# Patient Record
Sex: Female | Born: 1950
Health system: Southern US, Community
[De-identification: ages and names within clinical notes are randomized; demographics above are authoritative.]

## PROBLEM LIST (undated history)

## (undated) DIAGNOSIS — Z8619 Personal history of other infectious and parasitic diseases: Secondary | ICD-10-CM

## (undated) DIAGNOSIS — M1990 Unspecified osteoarthritis, unspecified site: Secondary | ICD-10-CM

## (undated) DIAGNOSIS — K219 Gastro-esophageal reflux disease without esophagitis: Secondary | ICD-10-CM

## (undated) DIAGNOSIS — F0390 Unspecified dementia without behavioral disturbance: Secondary | ICD-10-CM

## (undated) DIAGNOSIS — R011 Cardiac murmur, unspecified: Secondary | ICD-10-CM

## (undated) DIAGNOSIS — E785 Hyperlipidemia, unspecified: Secondary | ICD-10-CM

## (undated) DIAGNOSIS — T7840XA Allergy, unspecified, initial encounter: Secondary | ICD-10-CM

## (undated) HISTORY — PX: ABDOMINAL HYSTERECTOMY: SHX81

## (undated) HISTORY — DX: Cardiac murmur, unspecified: R01.1

## (undated) HISTORY — DX: Unspecified osteoarthritis, unspecified site: M19.90

## (undated) HISTORY — DX: Hyperlipidemia, unspecified: E78.5

## (undated) HISTORY — PX: POLYPECTOMY: SHX149

## (undated) HISTORY — DX: Allergy, unspecified, initial encounter: T78.40XA

## (undated) HISTORY — DX: Gastro-esophageal reflux disease without esophagitis: K21.9

## (undated) HISTORY — PX: COLONOSCOPY: SHX174

## (undated) HISTORY — DX: Personal history of other infectious and parasitic diseases: Z86.19

---

## 2003-12-16 ENCOUNTER — Encounter: Payer: Self-pay | Admitting: Internal Medicine

## 2004-07-26 ENCOUNTER — Ambulatory Visit: Payer: Self-pay | Admitting: Internal Medicine

## 2004-08-08 DIAGNOSIS — Z8619 Personal history of other infectious and parasitic diseases: Secondary | ICD-10-CM

## 2004-08-08 HISTORY — DX: Personal history of other infectious and parasitic diseases: Z86.19

## 2005-10-26 ENCOUNTER — Ambulatory Visit: Payer: Self-pay

## 2007-12-19 ENCOUNTER — Ambulatory Visit: Payer: Self-pay

## 2009-02-11 ENCOUNTER — Ambulatory Visit: Payer: Self-pay | Admitting: Internal Medicine

## 2009-02-11 DIAGNOSIS — J301 Allergic rhinitis due to pollen: Secondary | ICD-10-CM

## 2009-02-11 DIAGNOSIS — J4520 Mild intermittent asthma, uncomplicated: Secondary | ICD-10-CM

## 2009-02-11 DIAGNOSIS — K219 Gastro-esophageal reflux disease without esophagitis: Secondary | ICD-10-CM

## 2009-02-11 DIAGNOSIS — E785 Hyperlipidemia, unspecified: Secondary | ICD-10-CM | POA: Insufficient documentation

## 2009-02-12 LAB — CONVERTED CEMR LAB
Albumin: 4 g/dL (ref 3.5–5.2)
Alkaline Phosphatase: 58 units/L (ref 39–117)
Basophils Absolute: 0 10*3/uL (ref 0.0–0.1)
Bilirubin, Direct: 0 mg/dL (ref 0.0–0.3)
CO2: 31 meq/L (ref 19–32)
Calcium: 9.1 mg/dL (ref 8.4–10.5)
Chloride: 107 meq/L (ref 96–112)
Direct LDL: 158.1 mg/dL
Glucose, Bld: 92 mg/dL (ref 70–99)
HDL: 45.5 mg/dL (ref >39.00)
Hemoglobin: 14.4 g/dL (ref 12.0–15.0)
Lymphocytes Relative: 58.3 % — ABNORMAL HIGH (ref 12.0–46.0)
Monocytes Relative: 7.3 % (ref 3.0–12.0)
Neutro Abs: 1.4 10*3/uL (ref 1.4–7.7)
Phosphorus: 3.8 mg/dL (ref 2.3–4.6)
Platelets: 243 10*3/uL (ref 150.0–400.0)
Potassium: 3.8 meq/L (ref 3.5–5.1)
RDW: 12.6 % (ref 11.5–14.6)
TSH: 0.77 microintl units/mL (ref 0.35–5.50)
Total CHOL/HDL Ratio: 5
Total Protein: 6.8 g/dL (ref 6.0–8.3)
VLDL: 36 mg/dL (ref 0.0–40.0)

## 2009-02-27 ENCOUNTER — Ambulatory Visit: Payer: Self-pay | Admitting: Internal Medicine

## 2009-03-03 ENCOUNTER — Encounter: Payer: Self-pay | Admitting: Internal Medicine

## 2009-09-11 ENCOUNTER — Ambulatory Visit: Payer: Self-pay | Admitting: Family Medicine

## 2009-09-11 DIAGNOSIS — H60339 Swimmer's ear, unspecified ear: Secondary | ICD-10-CM

## 2009-09-15 ENCOUNTER — Telehealth: Payer: Self-pay | Admitting: Family Medicine

## 2010-02-14 ENCOUNTER — Encounter: Payer: Self-pay | Admitting: Internal Medicine

## 2010-02-16 ENCOUNTER — Telehealth (INDEPENDENT_AMBULATORY_CARE_PROVIDER_SITE_OTHER): Payer: Self-pay | Admitting: *Deleted

## 2010-02-17 ENCOUNTER — Ambulatory Visit: Payer: Self-pay | Admitting: Internal Medicine

## 2010-02-18 LAB — CONVERTED CEMR LAB
AST: 17 units/L (ref 0–37)
Alkaline Phosphatase: 58 units/L (ref 39–117)
Creatinine, Ser: 0.8 mg/dL (ref 0.4–1.2)
Direct LDL: 157.4 mg/dL
Eosinophils Relative: 2.6 % (ref 0.0–5.0)
GFR calc non Af Amer: 97.97 mL/min (ref >60)
Glucose, Bld: 95 mg/dL (ref 70–99)
Monocytes Absolute: 0.2 10*3/uL (ref 0.1–1.0)
Monocytes Relative: 6.3 % (ref 3.0–12.0)
Neutrophils Relative %: 29.4 % — ABNORMAL LOW (ref 43.0–77.0)
Phosphorus: 3.8 mg/dL (ref 2.3–4.6)
Platelets: 260 10*3/uL (ref 150.0–400.0)
Sodium: 142 meq/L (ref 135–145)
TSH: 0.96 microintl units/mL (ref 0.35–5.50)
Total Bilirubin: 0.7 mg/dL (ref 0.3–1.2)
Total CHOL/HDL Ratio: 5
Triglycerides: 145 mg/dL (ref 0.0–149.0)
VLDL: 29 mg/dL (ref 0.0–40.0)
WBC: 3.9 10*3/uL — ABNORMAL LOW (ref 4.5–10.5)

## 2010-02-22 ENCOUNTER — Ambulatory Visit: Payer: Self-pay | Admitting: Internal Medicine

## 2010-03-01 ENCOUNTER — Ambulatory Visit: Payer: Self-pay | Admitting: Internal Medicine

## 2010-03-02 ENCOUNTER — Encounter: Payer: Self-pay | Admitting: Internal Medicine

## 2010-03-22 ENCOUNTER — Encounter: Payer: Self-pay | Admitting: Internal Medicine

## 2010-03-22 ENCOUNTER — Ambulatory Visit: Payer: Self-pay | Admitting: Internal Medicine

## 2010-03-24 ENCOUNTER — Encounter: Payer: Self-pay | Admitting: Internal Medicine

## 2010-09-05 LAB — CONVERTED CEMR LAB: Fecal Occult Bld: NEGATIVE

## 2010-09-08 NOTE — Letter (Signed)
Summary: [facility]: Immunoassay Fecal Occult Blood (iFOB) Order Form  [place] [facility]  [address] [place], [place] [address]   Phone: [phone]  Fax: [phone]      [facility]: Immunoassay Fecal Occult Blood (iFOB) Order Form   February 22, 2010 MRN: [id number]   FAWN DESROCHER [birth year]   Physicican Name:_______[place]__________________  Diagnosis Code:_______V76.51___________________      Cindee Salt MD

## 2010-09-08 NOTE — Progress Notes (Signed)
Summary: ear drops are too costly  Phone Note Call from Patient Call back at [phone]   Caller: Patient Summary of Call: Pt states that the ear drops that she was prescribed at her last visit are too expensive, over a hundred dollars.  She is asking that something less expensive be called to [facility] [address]. Initial call taken by: Lowella Petties CMA,  September 15, 2009 12:18 PM    New/Updated Medications: CIPRODEX 0.3-0.1 % SUSP (CIPROFLOXACIN-DEXAMETHASONE) 4 drops into affected ear two times a day x 7 days Prescriptions: CIPRODEX 0.3-0.1 % SUSP (CIPROFLOXACIN-DEXAMETHASONE) 4 drops into affected ear two times a day x 7 days  #1 x 0   Entered and Authorized by:   Ruthe Mannan MD   Signed by:   Ruthe Mannan MD on 09/15/2009   Method used:   Electronically to        [facility] [address] (retail)       [address], [address]       [place], [place]  [address]       Ph: [phone]       Fax: [phone]   RxID:   [id number]   Appended Document: ear drops are too costly Patient Advised.

## 2010-09-08 NOTE — Letter (Signed)
Summary: Results Follow up Letter  [place] at [facility]  [address] [place], [place] [address]   Phone: [phone]  Fax: [phone]    03/02/2010 MRN: [id number]  Natalie [name] [address] [place], [place]  [address]  Dear Ms. Harker,  The following are the results of your recent test(s):  Test         Result    Pap Smear:        Normal _____  Not Normal _____ Comments: ______________________________________________________ Cholesterol: LDL(Bad cholesterol):         Your goal is less than:         HDL (Good cholesterol):       Your goal is more than: Comments:  ______________________________________________________ Mammogram:        Normal _____  Not Normal _____ Comments:  ___________________________________________________________________ Hemoccult:        Normal __X___  Not normal _______ Comments:  Please repeat in one year.  _____________________________________________________________________ Other Tests:    We routinely do not discuss normal results over the telephone.  If you desire a copy of the results, or you have any questions about this information we can discuss them at your next office visit.   Sincerely,      Tillman Abide, MD

## 2010-09-08 NOTE — Letter (Signed)
Summary: Results Follow up Letter  [place] at [facility]  [address] [place], [place] [address]   Phone: [phone]  Fax: [phone]    03/24/2010 MRN: [id number]  Natalie [name] [address] [place], [place]  [address]  Dear [name],  The following are the results of your recent test(s):  Test         Result    Pap Smear:        Normal _____  Not Normal _____ Comments: ______________________________________________________ Cholesterol: LDL(Bad cholesterol):         Your goal is less than:         HDL (Good cholesterol):       Your goal is more than: Comments:  ______________________________________________________ Mammogram:        Normal __X___  Not Normal _____ Comments: mammo looks fine,repeat recommended in 1-2 years  ___________________________________________________________________ Hemoccult:        Normal _____  Not normal _______ Comments:    _____________________________________________________________________ Other Tests:    We routinely do not discuss normal results over the telephone.  If you desire a copy of the results, or you have any questions about this information we can discuss them at your next office visit.   Sincerely,      Tillman Abide, MD

## 2010-09-08 NOTE — Letter (Signed)
Summary: [facility]   Imported By: Lanelle Bal 02/26/2010 10:17:04  _____________________________________________________________________  External Attachment:    Type:   Image     Comment:   External Document

## 2010-09-08 NOTE — Assessment & Plan Note (Signed)
Summary: CPX/CLE   Vital Signs:  Patient profile:   60 year old female Height:      63 inches Weight:      190 pounds Temp:     98.4 degrees F oral Pulse rate:   68 / minute Pulse rhythm:   regular BP sitting:   118 / 82  (right arm) Cuff size:   large  Vitals Entered By: Lowella Petties CMA (February 22, 2010 11:52 AM) CC: Check up   History of Present Illness: Had problem with left hand Couldn't bend thumb diagnosed with ligament problem started  ~10 days ago no known injury but had been doing different work at her job does feel better now has been wearing immobilizer  Asthma has been fairly quiet Not consistent with inhalers does have problems if she mows--she has her brother do this for her No regular cough uses albuterol very rarely  Allergies: No Known Drug Allergies  Past History:  Past medical, surgical, family and social histories (including risk factors) reviewed for relevance to current acute and chronic problems.  Past Medical History: Reviewed history from 02/11/2009 and no changes required. Allergic rhinitis Asthma Hyperlipidemia GERD  Past Surgical History: Reviewed history from 02/11/2009 and no changes required. Hysterectomy -1988   Fibroids VD x 2 Termination of pregnancy once  Family History: Reviewed history from 02/11/2009 and no changes required. Mom with arthritis, ulcers, HTN, DM Dad died of colon cancer 5 siblings 1 brother died of pancreatic cancer No CAD  Social History: Reviewed history from 02/11/2009 and no changes required. Occupation: Doctor, general practice [employer] Single---lives with mom 2 grown children Never Smoked Alcohol use-no  Review of Systems General:  sleeps okay in general weight up a few pounds no regular exercise--walks some at work wears seat belt. Eyes:  Denies double vision and vision loss-1 eye; using bifocals now. ENT:  Denies decreased hearing and ringing in ears; teeth  okay---working with dentist now. CV:  Complains of shortness of breath with exertion; denies chest pain or discomfort, difficulty breathing at night, difficulty breathing while lying down, fainting, near fainting, and palpitations; stable DOE going up steps. Resp:  Denies cough and shortness of breath. GI:  Denies abdominal pain, bloody stools, change in bowel habits, dark tarry stools, indigestion, nausea, and vomiting. GU:  Denies dysuria and incontinence; no sexual problems. MS:  Denies joint pain and joint swelling. Derm:  Denies lesion(s) and rash; small scattered hypopigmented areas. Neuro:  Denies headaches, numbness, tingling, and weakness. Psych:  Denies anxiety and depression. Heme:  Denies abnormal bruising and enlarge lymph nodes. Allergy:  Complains of seasonal allergies and sneezing; does well with the cetirizine.  Physical Exam  General:  alert and normal appearance.   Eyes:  pupils equal, pupils round, pupils reactive to light, and no optic disk abnormalities.   Ears:  R ear normal and L ear normal.   Mouth:  no erythema, no exudates, and no lesions.   Neck:  supple, no masses, no thyromegaly, no carotid bruits, and no cervical lymphadenopathy.   Breasts:  no masses, no abnormal thickening, no tenderness, and no adenopathy.   Lungs:  normal respiratory effort, normal breath sounds, no crackles, and no wheezes.   Heart:  normal rate, regular rhythm, no murmur, and no gallop.   Abdomen:  soft, non-tender, and no masses.   Msk:  no joint tenderness and no joint swelling.   Left hand and thumb not tender and with normal strength now Pulses:  1+  in feet Extremities:  no edema Neurologic:  alert & oriented X3, strength normal in all extremities, and gait normal.   Skin:  no rashes and no suspicious lesions.   Psych:  normally interactive, good eye contact, not anxious appearing, and not depressed appearing.     Impression & Recommendations:  Problem # 1:  PREVENTIVE HEALTH  CARE (ICD-V70.0) Assessment Comment Only healthy discussed being more active hand is better--probably just temporary strain at work  will do stool immunoassay order mammo  Problem # 2:  ASTHMA (ICD-493.90) Assessment: Unchanged  mild and intermittent spirometry is normal  Her updated medication list for this problem includes:    Qvar 80 Mcg/act Aers (Beclomethasone dipropionate) .Marland Kitchen... Take 1 puff daily use spacer device and rinse mouth after    Proventil Hfa 108 (90 Base) Mcg/act Aers (Albuterol sulfate) .Marland Kitchen... Take 2 puffs three times a day as needed for asthma symptoms  Orders: Spirometry w/Graph (94010)  Problem # 3:  HYPERLIPIDEMIA (ICD-272.4) No meds  Labs Reviewed: SGOT: 17 (02/17/2010)   SGPT: 10 (02/17/2010)   HDL:45.70 (02/17/2010), 45.50 (02/11/2009)  Chol:231 (02/17/2010), 232 (02/11/2009)  Trig:145.0 (02/17/2010), 180.0 (02/11/2009)  Problem # 4:  GERD (ICD-530.81) Assessment: Unchanged okay without meds  Complete Medication List: 1)  Cetirizine Hcl 10 Mg Tabs (Cetirizine hcl) .... Take 1 by mouth once daily 2)  Qvar 80 Mcg/act Aers (Beclomethasone dipropionate) .... Take 1 puff daily use spacer device and rinse mouth after 3)  Proventil Hfa 108 (90 Base) Mcg/act Aers (Albuterol sulfate) .... Take 2 puffs three times a day as needed for asthma symptoms  Other Orders: Radiology Referral (Radiology)  Patient Instructions: 1)  Please schedule a follow-up appointment in 1 year.  2)  Schedule your mammogram.  3)  Complete your hemoccult cards and return them soon.  Prescriptions: PROVENTIL HFA 108 (90 BASE) MCG/ACT AERS (ALBUTEROL SULFATE) take 2 puffs three times a day as needed for asthma symptoms  #1 x 1   Entered and Authorized by:   Cindee Salt MD   Signed by:   Cindee Salt MD on 02/22/2010   Method used:   Print then Give to Patient   RxID:   [id number] QVAR 80 MCG/ACT AERS (BECLOMETHASONE DIPROPIONATE) take 1 puff daily Use spacer  device and rinse mouth after  #1 x 5   Entered and Authorized by:   Cindee Salt MD   Signed by:   Cindee Salt MD on 02/22/2010   Method used:   Print then Give to Patient   RxID:   [id number]   Prior Medications (reviewed today): CETIRIZINE HCL 10 MG TABS (CETIRIZINE HCL) take 1 by mouth once daily QVAR 80 MCG/ACT AERS (BECLOMETHASONE DIPROPIONATE) take 1 puff daily Use spacer device and rinse mouth after PROVENTIL HFA 108 (90 BASE) MCG/ACT AERS (ALBUTEROL SULFATE) take 2 puffs three times a day as needed for asthma symptoms Current Allergies: No known allergies

## 2010-09-08 NOTE — Assessment & Plan Note (Signed)
Summary: EAR/CLE   Vital Signs:  Patient profile:   60 year old female Height:      63 inches Weight:      187 pounds BMI:     33.25 Temp:     98.3 degrees F oral Pulse rate:   80 / minute Pulse rhythm:   regular BP sitting:   138 / 86  (left arm) Cuff size:   regular  Vitals Entered By: Delilah Shan CMA Duncan Dull) (September 11, 2009 3:46 PM) CC: Ear   History of Present Illness: Natalie yo with 2 weeks of left ear pain. Pain is intermittent. Has not had URI symptoms but does have allregies.  On inhaled steroid for asthma. No fevers, chills. NO decreased hearing. No ear discharge. Never had anything like this before.  Current Medications (verified): 1)  Cetirizine Hcl 10 Mg Tabs (Cetirizine Hcl) .... Take 1 By Mouth Once Daily 2)  Qvar 80 Mcg/act Aers (Beclomethasone Dipropionate) .... Take 1 Puff Daily Use Spacer Device and Rinse Mouth After 3)  Proventil Hfa 108 (90 Base) Mcg/act Aers (Albuterol Sulfate) .... Take 2 Puffs Three Times A Day As Needed For Asthma Symptoms 4)  Cetraxal 0.2 % Soln (Ciprofloxacin Hcl) .Marland Kitchen.. 1 Drop in Affected Ear Two Times A Day X 7 Days  Allergies (verified): No Known Drug Allergies  Review of Systems      See HPI General:  Denies chills and fever. ENT:  Complains of earache; denies decreased hearing, ear discharge, postnasal drainage, sinus pressure, and sore throat. Resp:  Denies cough.  Physical Exam  General:  alert and normal appearance.   Ears:  Right and left TMs normal. Mild inflammation of right canal, other wise normal.  Pinna and tragus non tender. Nose:  External nasal examination shows no deformity or inflammation. Nasal mucosa are pink and moist without lesions or exudates. Mouth:  no erythema, no lesions, and poor dentition.   Lungs:  normal respiratory effort and normal breath sounds.   Heart:  normal rate, regular rhythm, no murmur, and no gallop.   Extremities:  no edema Psych:  normally interactive, good eye contact, not  anxious appearing, and not depressed appearing.     Impression & Recommendations:  Problem # 1:  OTITIS EXTERNA, ACUTE (ICD-380.12) Assessment New Will try Cipro drops (not ciprodex as too expensive). Inflammation very mild on exam, advised to follow up next week if no improvement or if symptoms worsen. Her updated medication list for this problem includes:    Cetraxal 0.2 % Soln (Ciprofloxacin hcl) .Marland Kitchen... 1 drop in affected ear two times a day x 7 days  Complete Medication List: 1)  Cetirizine Hcl 10 Mg Tabs (Cetirizine hcl) .... Take 1 by mouth once daily 2)  Qvar 80 Mcg/act Aers (Beclomethasone dipropionate) .... Take 1 puff daily use spacer device and rinse mouth after 3)  Proventil Hfa 108 (90 Base) Mcg/act Aers (Albuterol sulfate) .... Take 2 puffs three times a day as needed for asthma symptoms 4)  Cetraxal 0.2 % Soln (Ciprofloxacin hcl) .Marland Kitchen.. 1 drop in affected ear two times a day x 7 days Prescriptions: CETRAXAL 0.2 % SOLN (CIPROFLOXACIN HCL) 1 drop in affected ear two times a day x 7 days  #1 x 0   Entered and Authorized by:   Ruthe Mannan MD   Signed by:   Ruthe Mannan MD on 09/11/2009   Method used:   Electronically to        [facility] [address] (retail)  [address]       [place], [place]  [address]       Ph: [phone]       Fax: [phone]   RxID:   [id number]   Current Allergies (reviewed today): No known allergies

## 2010-09-08 NOTE — Progress Notes (Signed)
----   Converted from flag ---- ---- 02/16/2010 10:35 AM, Cindee Salt MD wrote: renal, CBC with diff, TSH--530.81 lipid, hepatic--272.4  ---- 02/16/2010 7:54 AM, Liane Comber CMA (AAMA) wrote: Pt is scheduled for cpx labs tomorrow, what labs to draw and dx codes? Thanks Tasha ------------------------------

## 2011-02-21 ENCOUNTER — Encounter: Payer: Self-pay | Admitting: Internal Medicine

## 2011-02-21 ENCOUNTER — Ambulatory Visit (INDEPENDENT_AMBULATORY_CARE_PROVIDER_SITE_OTHER): Payer: BC Managed Care – PPO | Admitting: Internal Medicine

## 2011-02-21 VITALS — BP 120/70 | HR 81 | Temp 98.6°F | Ht 63.0 in | Wt 188.0 lb

## 2011-02-21 DIAGNOSIS — S86819A Strain of other muscle(s) and tendon(s) at lower leg level, unspecified leg, initial encounter: Secondary | ICD-10-CM

## 2011-02-21 DIAGNOSIS — S86919A Strain of unspecified muscle(s) and tendon(s) at lower leg level, unspecified leg, initial encounter: Secondary | ICD-10-CM | POA: Insufficient documentation

## 2011-02-21 MED ORDER — BECLOMETHASONE DIPROPIONATE 80 MCG/ACT IN AERS: 1.0000 | INHALATION_SPRAY | RESPIRATORY_TRACT | Status: DC | PRN

## 2011-02-21 MED ORDER — TRAMADOL HCL 50 MG PO TABS: 50.0000 mg | ORAL_TABLET | Freq: Four times a day (QID) | ORAL | Status: AC | PRN

## 2011-02-21 MED ORDER — ALBUTEROL SULFATE HFA 108 (90 BASE) MCG/ACT IN AERS: 2.0000 | INHALATION_SPRAY | Freq: Four times a day (QID) | RESPIRATORY_TRACT | Status: DC | PRN

## 2011-02-21 NOTE — Progress Notes (Signed)
  Subjective:    Patient ID: Natalie [name], female    DOB: 06-23-1951, 60 y.o.   MRN: [id number]  HPI Having trouble with her right knee Pain with moving in a certain way Started over a week ago If reaches over laterally it hurts Hasn't noticed a problem with steps-- "I take it slow" Has to walk slow on flat ground  Did have some swelling last week--better now  Has been using advil 400mg --occ at work or at night May help the pain but not always  Has trip to [place] planned for Friday To see son  No current outpatient prescriptions on file prior to visit.    No Known Allergies  Past Medical History  Diagnosis Date  . Allergy   . Asthma   . Hyperlipidemia   . GERD (gastroesophageal reflux disease)     Past Surgical History  Procedure Date  . Abdominal hysterectomy   . Vaginal delivery     x2    Family History  Problem Relation Age of Onset  . Arthritis Mother   . Diabetes Mother   . Hypertension Mother   . Heart disease Neg Hx     History   Social History  . Marital Status: Single    Spouse Name: N/A    Number of Children: 2  . 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 Smoking status: Never Smoker   . Smokeless tobacco: Never Used  . Alcohol Use: No  . Drug Use: No  . Sexually Active: Not on file   Other Topics Concern  . Not on file   Social History Narrative  . No narrative on file   Review of Systems Some trouble trying to sleep--gets pain Recent laryngitis--better now after antibiotic from urgent care at [facility] No fever now     Objective:   Physical Exam  Constitutional: She appears well-developed and well-nourished.  Musculoskeletal:       Gait normal No knee swelling ??slightly positive [place] with medial stress on right Ligaments all stable Normal passive ROM          Assessment & Plan:

## 2011-02-21 NOTE — Assessment & Plan Note (Signed)
Mild symptoms Could have slight meniscus tear but certainly not something clear cut Advised to try brace to prevent twisting advil and tramadol for prn use Dr Patsy Lager if worsens

## 2011-02-21 NOTE — Patient Instructions (Signed)
Please continue the advil, 2 tabs, up to 3 times a day for pain Please try the tramadol if the pain continues Please use a brace to prevent your knee from twisting.  If your knee pain worsens, call for appt with Dr Patsy Lager, our sports medicine specialist

## 2011-03-18 ENCOUNTER — Ambulatory Visit (INDEPENDENT_AMBULATORY_CARE_PROVIDER_SITE_OTHER): Payer: BC Managed Care – PPO | Admitting: Family Medicine

## 2011-03-18 ENCOUNTER — Encounter: Payer: Self-pay | Admitting: Family Medicine

## 2011-03-18 DIAGNOSIS — M25569 Pain in unspecified knee: Secondary | ICD-10-CM

## 2011-03-18 DIAGNOSIS — M2390 Unspecified internal derangement of unspecified knee: Secondary | ICD-10-CM

## 2011-03-18 DIAGNOSIS — M25561 Pain in right knee: Secondary | ICD-10-CM

## 2011-03-18 DIAGNOSIS — M2391 Unspecified internal derangement of right knee: Secondary | ICD-10-CM | POA: Insufficient documentation

## 2011-03-18 MED ORDER — TRAMADOL HCL 50 MG PO TABS: 50.0000 mg | ORAL_TABLET | Freq: Four times a day (QID) | ORAL | Status: DC | PRN

## 2011-03-18 NOTE — Progress Notes (Addendum)
  Subjective:    Patient ID: Natalie [name], female    DOB: 1951/04/09, 60 y.o.   MRN: [id number]  HPI  Natalie [name], a 60 y.o. female presents today in the office for the following:    R knee injury, initial strain about a month ago, then about 2 weeks ago, fell while in [place], and now knee is swollen with a large effusion.  Patient presents with 14 day h/o exacerbation on top of 1 month of anteromedial sided knee pain after falling while in [place]. No audible pop was heard. The patient has had an effusion. No symptomatic giving-way. No mechanical clicking. Joint has not locked up. Patient has been able to walk but is limping. The patient does not pain going up and down stairs or rising from a seated position.   Pain location: medial mostly Current physical activity: none Prior Knee Surgery: none Current pain meds: motrin, tramadol Bracing: wearing a supportive brace  Prior eval at [facility]. X-rays at there facility showed some OA changes per her report. Not available for review.  The PMH, PSH, Social History, Family History, Medications, and allergies have been reviewed in [facility], and have been updated if relevant.  Review of Systems  REVIEW OF SYSTEMS  GEN: No fevers, chills. Nontoxic. Primarily MSK c/o today. MSK: Detailed in the HPI GI: tolerating PO intake without difficulty Neuro: No numbness, parasthesias, or tingling associated. Otherwise the pertinent positives of the ROS are noted above.      Objective:   Physical Exam   Physical Exam  Blood pressure 120/70, pulse 76, temperature 98.5 F (36.9 C), temperature source Oral, height 5\' 4"  (1.626 m), weight 190 lb (86.183 kg), SpO2 98.00%.  GEN: Well-developed,well-nourished,in no acute distress; alert,appropriate and cooperative throughout examination HEENT: Normocephalic and atraumatic without obvious abnormalities. Ears, externally no deformities PULM: Breathing comfortably in no respiratory distress EXT: No  clubbing, cyanosis, or edema PSYCH: Normally interactive. Cooperative during the interview. Pleasant. Friendly and conversant. Not anxious or depressed appearing. Normal, full affect.  Gait: Normal heel toe pattern - ANTALGIC ROM: 0 - 85 Effusion: large, ballotable Echymosis or edema: none Patellar tendon NT Painful PLICA: neg Patellar grind: negative Medial and lateral patellar facet loading: negative medial and lateral joint lines: mild TTP, medial Meniscal testing equivocal given effusion Varus and valgus stress: stable Lachman: neg Ant and Post drawer: neg Hip abduction, IR, ER: WNL Hip flexion str: 5/5 Hip abd: 5/5 Quad: 5/5 VMO atrophy:mild       Assessment & Plan:   1. Right knee pain  traMADol (ULTRAM) 50 MG tablet  2. Internal derangement of right knee  traMADol (ULTRAM) 50 MG tablet   OA flare vs. Tear, difficult to say with that large an effusion. Will aspirate, inject and recheck in 3 weeks.  Knee Aspiration and Injection, R knee Patient verbally consented; risks, benefits, and alternatives explained. Patient prepped with betadine. Ethyl chloride for anesthesia. 10 cc of 1% Lidocaine used in wheal then injected Subcutaneous fashion with 27 gauge needle on lateral approach. Under sterilne conditions, 18 gauge needle used via lateral approach to aspirate 30 cc of yellowish synovial fluid. Then 9 cc of Lidocaine 1% and 1 cc of Kenalog 40 mg injected. Tolerated well, decreased pain, no complications.

## 2011-03-18 NOTE — Patient Instructions (Signed)
Recheck with Dr. Patsy Lager in 3 weeks

## 2011-03-19 NOTE — Progress Notes (Signed)
  Subjective:    Patient ID: Natalie [name], female    DOB: May 20, 1951, 60 y.o.   MRN: [id number]  HPI    Review of Systems     Objective:   Physical Exam        Assessment & Plan:

## 2011-03-23 ENCOUNTER — Encounter: Payer: Self-pay | Admitting: Internal Medicine

## 2011-03-23 ENCOUNTER — Ambulatory Visit (INDEPENDENT_AMBULATORY_CARE_PROVIDER_SITE_OTHER): Payer: BC Managed Care – PPO | Admitting: Internal Medicine

## 2011-03-23 DIAGNOSIS — Z0000 Encounter for general adult medical examination without abnormal findings: Secondary | ICD-10-CM

## 2011-03-23 DIAGNOSIS — J45909 Unspecified asthma, uncomplicated: Secondary | ICD-10-CM

## 2011-03-23 DIAGNOSIS — E785 Hyperlipidemia, unspecified: Secondary | ICD-10-CM

## 2011-03-23 DIAGNOSIS — Z1231 Encounter for screening mammogram for malignant neoplasm of breast: Secondary | ICD-10-CM

## 2011-03-23 DIAGNOSIS — K219 Gastro-esophageal reflux disease without esophagitis: Secondary | ICD-10-CM

## 2011-03-23 LAB — HEPATIC FUNCTION PANEL
ALT: 19 U/L (ref 0–35)
AST: 19 U/L (ref 0–37)
Albumin: 4 g/dL (ref 3.5–5.2)
Total Bilirubin: 0.7 mg/dL (ref 0.3–1.2)

## 2011-03-23 LAB — CBC WITH DIFFERENTIAL/PLATELET
Basophils Relative: 0.7 % (ref 0.0–3.0)
Eosinophils Relative: 1.1 % (ref 0.0–5.0)
HCT: 42.9 % (ref 36.0–46.0)
Hemoglobin: 14.2 g/dL (ref 12.0–15.0)
Lymphs Abs: 3.2 10*3/uL (ref 0.7–4.0)
Monocytes Relative: 5.4 % (ref 3.0–12.0)
Neutro Abs: 2.9 10*3/uL (ref 1.4–7.7)
WBC: 6.6 10*3/uL (ref 4.5–10.5)

## 2011-03-23 LAB — BASIC METABOLIC PANEL
BUN: 12 mg/dL (ref 6–23)
CO2: 33 mEq/L — ABNORMAL HIGH (ref 19–32)
Calcium: 9.2 mg/dL (ref 8.4–10.5)
Creatinine, Ser: 0.9 mg/dL (ref 0.4–1.2)
GFR: 85.93 mL/min (ref >60.00)
Glucose, Bld: 91 mg/dL (ref 70–99)
Sodium: 142 mEq/L (ref 135–145)

## 2011-03-23 NOTE — Assessment & Plan Note (Signed)
She prefers no meds Discussed lifestyle measures

## 2011-03-23 NOTE — Progress Notes (Signed)
Subjective:    Patient ID: Natalie [name], female    DOB: 07-Aug-1951, 60 y.o.   MRN: [id number]  HPI Right knee is still achy medially Gets stiff when sitting---does loosen up after a while with walking Gets occ shooting pain  Asthma has been controlled Rarely uses rescue inhaler Doesn't really do much activity  No stomach troubles  Discussed cholesterol She is not excited about meds  Current Outpatient Prescriptions on File Prior to Visit  Medication Sig Dispense Refill  . albuterol (VENTOLIN HFA) 108 (90 BASE) MCG/ACT inhaler Inhale 2 puffs into the lungs every 6 (six) hours as needed for wheezing.  1 Inhaler  1  . beclomethasone (QVAR) 80 MCG/ACT inhaler Inhale 1 puff into the lungs as needed.  1 Inhaler  12  . cetirizine (ZYRTEC) 10 MG tablet Take 10 mg by mouth daily.        . traMADol (ULTRAM) 50 MG tablet Take 1 tablet (50 mg total) by mouth every 6 (six) hours as needed for pain.  50 tablet  1    No Known Allergies  Past Medical History  Diagnosis Date  . Allergy   . Asthma   . Hyperlipidemia   . GERD (gastroesophageal reflux disease)     Past Surgical History  Procedure Date  . Abdominal hysterectomy   . Vaginal delivery     x2    Family History  Problem Relation Age of Onset  . Arthritis Mother   . Diabetes Mother   . Hypertension Mother   . Heart disease Neg Hx     History   Social History  . Marital Status: Single    Spouse Name: N/A    Number of Children: 2  . 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 Smoking status: Never Smoker   . Smokeless tobacco: Never Used  . Alcohol Use: No  . Drug Use: No  . Sexually Active: Not on file   Other Topics Concern  . Not on file   Social History Narrative  . No narrative on file   Review of Systems  Constitutional: Negative for fatigue and unexpected weight change.       Wears seat belt  HENT:  Positive for congestion and rhinorrhea. Negative for hearing loss, dental problem and tinnitus.        Trouble with allergies to dust---does okay with cetirizine Has been getting dental fillings caught up  Eyes: Negative for visual disturbance.       No diplopia or focal vision loss New glasses Early cataracts  Respiratory: Negative for cough, chest tightness and shortness of breath.   Cardiovascular: Positive for leg swelling. Negative for chest pain and palpitations.       Right foot swelling with the knee problems--related to brace she wore  Gastrointestinal: Positive for constipation. Negative for nausea, vomiting, abdominal pain and blood in stool.       Mild constipation with travel to [facility] No sig heartburn  Genitourinary: Positive for frequency. Negative for dysuria and difficulty urinating.  Musculoskeletal: Positive for joint swelling and arthralgias. Negative for back pain.       Only right knee problems Had right knee x-ray at urgent care and showed early arthritis  Skin: Negative for rash.       No suspicious lesions  Neurological: Negative for dizziness, syncope, weakness, light-headedness, numbness and headaches.  Hematological: Negative for adenopathy.  Does not bruise/bleed easily.  Psychiatric/Behavioral: Negative for sleep disturbance and dysphoric mood. The patient is not nervous/anxious.        Objective:   Physical Exam  Constitutional: She is oriented to person, place, and time. She appears well-developed and well-nourished. No distress.  HENT:  Head: Normocephalic and atraumatic.  Right Ear: External ear normal.  Left Ear: External ear normal.  Mouth/Throat: Oropharynx is clear and moist.       TMs normal  Eyes: Conjunctivae and EOM are normal. Pupils are equal, round, and reactive to light.       Fundi benign  Neck: Normal range of motion. Neck supple. No thyromegaly present.  Cardiovascular: Normal rate, regular rhythm, normal heart sounds and  intact distal pulses.  Exam reveals no gallop.   No murmur heard. Pulmonary/Chest: Effort normal and breath sounds normal. No respiratory distress. She has no wheezes. She has no rales.  Abdominal: Soft. She exhibits no mass. There is no tenderness.  Musculoskeletal: Normal range of motion. She exhibits no edema and no tenderness.       No sig effusion in right knee now  Lymphadenopathy:    She has no cervical adenopathy.  Neurological: She is alert and oriented to person, place, and time. She exhibits normal muscle tone.       No focal weakness  Skin: Skin is warm. No rash noted.  Psychiatric: She has a normal mood and affect. Her behavior is normal. Judgment and thought content normal.          Assessment & Plan:

## 2011-03-23 NOTE — Assessment & Plan Note (Signed)
Quiet on current Rx No changes needed

## 2011-03-23 NOTE — Assessment & Plan Note (Signed)
Quiet without meds now

## 2011-03-23 NOTE — Patient Instructions (Signed)
Please set up your mammogram Please do the stool test and mail back Check on the shingles vaccine with your insurance (zostavax) and set up to get the vaccine if it is covered

## 2011-03-23 NOTE — Assessment & Plan Note (Signed)
Healthy but out of shape Will do stool tests again Requests yearly mammo Discussed zostavax---she will look into it

## 2011-03-31 ENCOUNTER — Other Ambulatory Visit: Payer: BC Managed Care – PPO

## 2011-03-31 ENCOUNTER — Other Ambulatory Visit: Payer: Self-pay | Admitting: Internal Medicine

## 2011-03-31 DIAGNOSIS — Z1211 Encounter for screening for malignant neoplasm of colon: Secondary | ICD-10-CM

## 2011-03-31 LAB — FECAL OCCULT BLOOD, IMMUNOCHEMICAL: Fecal Occult Bld: NEGATIVE

## 2011-04-01 ENCOUNTER — Encounter: Payer: Self-pay | Admitting: *Deleted

## 2011-04-06 ENCOUNTER — Encounter: Payer: Self-pay | Admitting: Family Medicine

## 2011-04-06 ENCOUNTER — Ambulatory Visit (INDEPENDENT_AMBULATORY_CARE_PROVIDER_SITE_OTHER): Payer: BC Managed Care – PPO | Admitting: Family Medicine

## 2011-04-06 DIAGNOSIS — M2391 Unspecified internal derangement of right knee: Secondary | ICD-10-CM

## 2011-04-06 DIAGNOSIS — M2390 Unspecified internal derangement of unspecified knee: Secondary | ICD-10-CM

## 2011-04-06 NOTE — Patient Instructions (Signed)
F/u 6 weeks 

## 2011-04-07 NOTE — Progress Notes (Signed)
  Subjective:    Patient ID: Natalie [name], female    DOB: 1950-11-06, 60 y.o.   MRN: [id number]  HPI  Natalie [name], a 60 y.o. female presents today in the office for the following:  F/u R knee injury, injury in early July while in [place], seen several weeks ago with an effusion and some limitation in motion. Has been altering activities, aspirated and injected her knee last OV. Now pain is a good deal better, not resolved, but without mechanical symptoms.  03/18/2011 R knee injury, initial strain about a month ago, then about 2 weeks ago, fell while in [place], and now knee is swollen with a large effusion.  Patient presents with 14 day h/o exacerbation on top of 1 month of anteromedial sided knee pain after falling while in [place]. No audible pop was heard. The patient has had an effusion. No symptomatic giving-way. No mechanical clicking. Joint has not locked up. Patient has been able to walk but is limping. The patient does not pain going up and down stairs or rising from a seated position.   Pain location: medial mostly Current physical activity: none Prior Knee Surgery: none Current pain meds: motrin, tramadol Bracing: wearing a supportive brace  Prior eval at [facility]. X-rays at there facility showed some OA changes per her report. Not available for review.   Review of Systems REVIEW OF SYSTEMS  GEN: No fevers, chills. Nontoxic. Primarily MSK c/o today. MSK: Detailed in the HPI GI: tolerating PO intake without difficulty Neuro: No numbness, parasthesias, or tingling associated. Otherwise the pertinent positives of the ROS are noted above.      Objective:   Physical Exam   Physical Exam  Blood pressure 120/70, pulse 89, temperature 97.9 F (36.6 C), temperature source Oral, height 5\' 4"  (1.626 m), weight 184 lb 6.4 oz (83.643 kg), SpO2 97.00%.  GEN: Well-developed,well-nourished,in no acute distress; alert,appropriate and cooperative throughout examination HEENT:  Normocephalic and atraumatic without obvious abnormalities. Ears, externally no deformities PULM: Breathing comfortably in no respiratory distress EXT: No clubbing, cyanosis, or edema PSYCH: Normally interactive. Cooperative during the interview. Pleasant. Friendly and conversant. Not anxious or depressed appearing. Normal, full affect.  Gait: Normal heel toe pattern, mild limp ROM: WNL, 0-125 Effusion: neg Echymosis or edema: none Patellar tendon NT Painful PLICA: neg Patellar grind: negative Medial and lateral patellar facet loading: minimal pain medial and lateral joint lines: mild tenderness medially Mcmurray's neg Flexion-pinch neg Varus and valgus stress: stable Lachman: neg Ant and Post drawer: neg Hip abduction, IR, ER: WNL Hip flexion str: 4+/5 Hip abd: 4+/5 Quad: 4+/5 VMO atrophy: notable on the R compared to L      Assessment & Plan:   1. Internal derangement of right knee    Knee exam looks much better today Pain improved  Significant weakness on the right Reviewed HEP from [facility] and would try to address str deficits Suspect much of remaining pain from OA exac resolving and resulting atrophy  Will follow conservatively and recheck in 6 weeks

## 2011-04-22 ENCOUNTER — Ambulatory Visit: Payer: Self-pay | Admitting: Internal Medicine

## 2011-04-26 ENCOUNTER — Encounter: Payer: Self-pay | Admitting: *Deleted

## 2011-05-02 ENCOUNTER — Encounter: Payer: Self-pay | Admitting: Internal Medicine

## 2011-05-03 ENCOUNTER — Encounter: Payer: Self-pay | Admitting: Family Medicine

## 2011-05-03 ENCOUNTER — Ambulatory Visit (INDEPENDENT_AMBULATORY_CARE_PROVIDER_SITE_OTHER): Payer: BC Managed Care – PPO | Admitting: Family Medicine

## 2011-05-03 VITALS — BP 120/84 | HR 75 | Temp 98.3°F | Wt 185.2 lb

## 2011-05-03 DIAGNOSIS — J309 Allergic rhinitis, unspecified: Secondary | ICD-10-CM

## 2011-05-03 DIAGNOSIS — J069 Acute upper respiratory infection, unspecified: Secondary | ICD-10-CM

## 2011-05-03 MED ORDER — AMOXICILLIN 500 MG PO CAPS: 500.0000 mg | ORAL_CAPSULE | Freq: Two times a day (BID) | ORAL | Status: DC

## 2011-05-03 NOTE — Progress Notes (Signed)
SUBJECTIVE:  Natalie [name] is a 60 y.o. female who complains of congestion, sneezing, sore throat, dry cough and hoarseness for 7 days. She denies a history of anorexia, chest pain, chills and dizziness and has a history of asthma. Patient denies smoke cigarettes.   Patient Active Problem List  Diagnoses  . HYPERLIPIDEMIA  . ALLERGIC RHINITIS  . ASTHMA  . GERD  . Internal derangement of right knee  . Routine general medical examination at a health care facility   Past Medical History  Diagnosis Date  . Allergy   . Asthma   . Hyperlipidemia   . GERD (gastroesophageal reflux disease)    Past Surgical History  Procedure Date  . Abdominal hysterectomy   . Vaginal delivery     x2   History  Substance Use Topics  . Smoking status: Never Smoker   . Smokeless tobacco: Never Used  . Alcohol Use: No   Family History  Problem Relation Age of Onset  . Arthritis Mother   . Diabetes Mother   . Hypertension Mother   . Heart disease Neg Hx    No Known Allergies Current Outpatient Prescriptions on File Prior to Visit  Medication Sig Dispense Refill  . albuterol (VENTOLIN HFA) 108 (90 BASE) MCG/ACT inhaler Inhale 2 puffs into the lungs every 6 (six) hours as needed for wheezing.  1 Inhaler  1  . beclomethasone (QVAR) 80 MCG/ACT inhaler Inhale 1 puff into the lungs as needed.  1 Inhaler  12  . cetirizine (ZYRTEC) 10 MG tablet Take 10 mg by mouth daily.        . traMADol (ULTRAM) 50 MG tablet Take 1 tablet (50 mg total) by mouth every 6 (six) hours as needed for pain.  50 tablet  1   The PMH, PSH, Social History, Family History, Medications, and allergies have been reviewed in [facility], and have been updated if relevant.  OBJECTIVE: BP 120/84  Pulse 75  Temp(Src) 98.3 F (36.8 C) (Oral)  Wt 185 lb 4 oz (84.029 kg)  She appears well, vital signs are as noted. Ears normal.  Throat and pharynx normal.  Neck supple. No adenopathy in the neck. Nose is congested. Sinuses non tender. The  chest is clear, without wheezes or rales.  ASSESSMENT:  viral upper respiratory illness  PLAN: Symptomatic therapy suggested: push fluids, rest and return office visit prn if symptoms persist or worsen. Lack of antibiotic effectiveness discussed with her.  Rx given for amoxicillin to fill if symptoms do not improve over next 5-7 days.   Call or return to clinic prn if these symptoms worsen or fail to improve as anticipated.

## 2011-05-03 NOTE — Patient Instructions (Addendum)
Take antibiotic as directed if symptoms not improving in next several days..  Drink lots of fluids.  Treat sympotmatically with Mucinex, nasal saline irrigation, and Tylenol/Ibuprofen. Also try claritin D or zyrtec D over the counter- two times a day as needed ( have to sign for them at pharmacy). You can use warm compresses.  Cough suppressant at night. Call if not improving as expected in 5-7 days.

## 2011-05-17 ENCOUNTER — Ambulatory Visit (INDEPENDENT_AMBULATORY_CARE_PROVIDER_SITE_OTHER): Payer: BC Managed Care – PPO | Admitting: Family Medicine

## 2011-05-17 ENCOUNTER — Encounter: Payer: Self-pay | Admitting: Family Medicine

## 2011-05-17 VITALS — BP 110/72 | HR 80 | Temp 98.0°F | Wt 188.0 lb

## 2011-05-17 DIAGNOSIS — M25569 Pain in unspecified knee: Secondary | ICD-10-CM

## 2011-05-17 DIAGNOSIS — M25561 Pain in right knee: Secondary | ICD-10-CM

## 2011-05-17 DIAGNOSIS — J45901 Unspecified asthma with (acute) exacerbation: Secondary | ICD-10-CM

## 2011-05-17 MED ORDER — PREDNISONE 20 MG PO TABS: ORAL_TABLET | ORAL | Status: DC

## 2011-05-17 NOTE — Progress Notes (Signed)
  Subjective:    Patient ID: Natalie [name], female    DOB: 07-02-1951, 60 y.o.   MRN: [id number]  HPI  Natalie [name], a 60 y.o. female presents today in the office for the following:    F/u R knee: Pleasant patient who injured her knee about 3 months ago presents in followup. Initially I saw her after one of my partners saw her, and we did an intra-articular injection. The patient's symptoms basically have resolved, now she has an occasional medial pain when she overexerts herself, but has not even really had to take any tramadol. Right now her knee feels pretty good. No locking up giving way appear  Damp weather some. The patient was sick a couple of weeks ago, she saw Dr. Clifton Custard, and was given some amoxicillin. She continues to have some difficulty breathing and coughing. She does have a past history significant for asthma. She has been using her albuterol per  The PMH, PSH, Social History, Family History, Medications, and allergies have been reviewed in [facility], and have been updated if relevant.   Review of Systems ROS: GEN: Acute illness details above GI: Tolerating PO intake GU: maintaining adequate hydration and urination Pulm: No SOB Interactive and getting along well at home.  Otherwise, ROS is as per the HPI. k    Objective:   Physical Exam   Physical Exam  Blood pressure 110/72, pulse 80, temperature 98 F (36.7 C), temperature source Oral, weight 188 lb (85.276 kg).  GEN: A and O x 3. WDWN. NAD.    ENT: Nose clear, ext NML.  No LAD.  No JVD.  TM's clear. Oropharynx clear.  PULM: Normal WOB, no distress. No crackles. Exp wheezes diffusely CV: RRR, no M/G/R, No rubs, No JVD.   ABD: S, NT, ND, + BS. No rebound. No guarding. No HSM.   EXT: warm and well-perfused, No c/c/e. PSYCH: Pleasant and conversant.  Gait: Normal heel toe pattern ROM: WNL Effusion: neg Echymosis or edema: none Patellar tendon NT Painful PLICA: neg Patellar grind: negative Medial and  lateral patellar facet loading: negative medial and lateral joint lines:NT Mcmurray's neg Flexion-pinch neg Varus and valgus stress: stable Lachman: neg Ant and Post drawer: neg Hip abduction, IR, ER: WNL Hip flexion str: 5/5 Hip abd: 5/5 Quad: 5/5 VMO atrophy: mild Hamstring concentric and eccentric: 5/5       Assessment & Plan:   1. Asthma exacerbation   2. Knee pain, right     I think this is mostly asthma this point, we'll give her 5 days of prednisone at 40 mg and then 3 days and 20 mg. Continue with albuterol as needed.  Her knee has improved, at this point I cannot produce symptoms with my examination, and I think she is returned back to baseline with osteoarthritis. Would continue with when necessary Tylenol, Motrin, and tramadol as needed. Encouraged her to continue with some thigh strengthening

## 2011-06-16 ENCOUNTER — Telehealth: Payer: Self-pay | Admitting: Internal Medicine

## 2011-06-16 NOTE — Telephone Encounter (Signed)
Requesting shingles shot.  Please call patient back at [phone]

## 2011-06-16 NOTE — Telephone Encounter (Signed)
Called pt back, left message for her to return my call.

## 2011-06-21 ENCOUNTER — Ambulatory Visit (INDEPENDENT_AMBULATORY_CARE_PROVIDER_SITE_OTHER): Payer: BC Managed Care – PPO | Admitting: *Deleted

## 2011-06-21 DIAGNOSIS — Z23 Encounter for immunization: Secondary | ICD-10-CM

## 2011-06-21 DIAGNOSIS — Z2911 Encounter for prophylactic immunotherapy for respiratory syncytial virus (RSV): Secondary | ICD-10-CM

## 2011-06-21 NOTE — Progress Notes (Signed)
Zostavax vaccine given during nurse visit today. 

## 2011-09-06 ENCOUNTER — Telehealth: Payer: Self-pay | Admitting: Internal Medicine

## 2011-09-06 NOTE — Telephone Encounter (Signed)
ERROR

## 2011-09-07 ENCOUNTER — Other Ambulatory Visit: Payer: Self-pay | Admitting: *Deleted

## 2011-09-07 MED ORDER — ALBUTEROL SULFATE HFA 108 (90 BASE) MCG/ACT IN AERS: 2.0000 | INHALATION_SPRAY | Freq: Four times a day (QID) | RESPIRATORY_TRACT | Status: DC | PRN

## 2011-09-07 NOTE — Telephone Encounter (Signed)
Patient asking for generic inhaler for Proventil, called pt and left message advising that albuterol is a generic inhaler. Pt wanted a printed rx to be picked up to take to a pharmacy to see which is cheaper. Left message on machine that rx is ready for pick-up, and it will be at our front desk.

## 2012-04-02 ENCOUNTER — Encounter: Payer: BC Managed Care – PPO | Admitting: Internal Medicine

## 2012-04-04 ENCOUNTER — Encounter: Payer: Self-pay | Admitting: Internal Medicine

## 2012-04-04 ENCOUNTER — Ambulatory Visit (INDEPENDENT_AMBULATORY_CARE_PROVIDER_SITE_OTHER): Payer: BC Managed Care – PPO | Admitting: Internal Medicine

## 2012-04-04 ENCOUNTER — Encounter: Payer: Self-pay | Admitting: Gastroenterology

## 2012-04-04 VITALS — BP 118/82 | HR 82 | Temp 98.6°F | Ht 64.0 in | Wt 186.0 lb

## 2012-04-04 DIAGNOSIS — J45909 Unspecified asthma, uncomplicated: Secondary | ICD-10-CM

## 2012-04-04 DIAGNOSIS — Z0000 Encounter for general adult medical examination without abnormal findings: Secondary | ICD-10-CM

## 2012-04-04 DIAGNOSIS — Z1211 Encounter for screening for malignant neoplasm of colon: Secondary | ICD-10-CM

## 2012-04-04 DIAGNOSIS — M159 Polyosteoarthritis, unspecified: Secondary | ICD-10-CM | POA: Insufficient documentation

## 2012-04-04 MED ORDER — ALBUTEROL SULFATE HFA 108 (90 BASE) MCG/ACT IN AERS: 2.0000 | INHALATION_SPRAY | Freq: Four times a day (QID) | RESPIRATORY_TRACT | Status: DC | PRN

## 2012-04-04 NOTE — Progress Notes (Signed)
Subjective:    Patient ID: Natalie [name], female    DOB: 27-Jan-1951, 61 y.o.   MRN: [id number]  HPI Here for physical  Is concerned about cost of her inhalers Uses the albuterol prn--- perhaps a couple of times per week Asthma mostly asks up with a cold or if the temperature is cold in labs at work Not using the q-var daily because of the expense  Uses tramadol rarely Mostly for shoulder or knee pain Gets right knee swelling at times advil gives relief--- takes 2 but not regularly  Current Outpatient Prescriptions on File Prior to Visit  Medication Sig Dispense Refill  . albuterol (VENTOLIN HFA) 108 (90 BASE) MCG/ACT inhaler Inhale 2 puffs into the lungs every 6 (six) hours as needed for wheezing.  1 Inhaler  1  . cetirizine (ZYRTEC) 10 MG tablet Take 10 mg by mouth daily.        . traMADol (ULTRAM) 50 MG tablet Take 1 tablet (50 mg total) by mouth every 6 (six) hours as needed for pain.  50 tablet  1  . DISCONTD: beclomethasone (QVAR) 80 MCG/ACT inhaler Inhale 1 puff into the lungs as needed.  1 Inhaler  12    No Known Allergies  Past Medical History  Diagnosis Date  . Allergy   . Asthma   . Hyperlipidemia   . GERD (gastroesophageal reflux disease)     Past Surgical History  Procedure Date  . Abdominal hysterectomy   . Vaginal delivery     x2    Family History  Problem Relation Age of Onset  . Arthritis Mother   . Diabetes Mother   . Hypertension Mother   . Heart disease Neg Hx     History   Social History  . Marital Status: Single    Spouse Name: N/A    Number of Children: 2  . 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 Smoking status: Never Smoker   . Smokeless tobacco: Never Used  . Alcohol Use: No  . Drug Use: No  . Sexually Active: Not on file   Other Topics Concern  . Not on file   Social History Narrative  . No narrative on file   Review of  Systems  Constitutional: Negative for fatigue and unexpected weight change.       Wears seat belt Does a lot of care for granddaughter  HENT: Positive for congestion and rhinorrhea. Negative for hearing loss, dental problem and tinnitus.        Partial denture--new one. Keeps up with dentist for the rest  Eyes: Negative for visual disturbance.       No diplopia or unilateral vision loss Occ has some floaters  Respiratory: Positive for shortness of breath and wheezing. Negative for cough.        Gets DOE on the steps  Cardiovascular: Positive for leg swelling. Negative for chest pain and palpitations.       Some right leg swelling--relates to knee  Gastrointestinal: Negative for nausea, vomiting, abdominal pain, constipation and blood in stool.       No heartburn Ready for colonoscopy  Genitourinary: Negative for dysuria, urgency and difficulty urinating.       No sexual problems  Musculoskeletal: Positive for arthralgias. Negative for back pain and joint swelling.       Knee/shoulder  Skin: Negative for rash.  Chronic white spots on legs and slightly on arms  Neurological: Positive for numbness. Negative for dizziness, syncope, weakness, light-headedness and headaches.       Right hand numbness depending on work tasks  Hematological: Negative for adenopathy. Bruises/bleeds easily.       No change in easing bruising  Psychiatric/Behavioral: Negative for disturbed wake/sleep cycle and dysphoric mood. The patient is not nervous/anxious.        Noisy sleeper but awakens refreshed       Objective:   Physical Exam  Constitutional: She is oriented to person, place, and time. She appears well-developed and well-nourished. No distress.  HENT:  Head: Normocephalic and atraumatic.  Right Ear: External ear normal.  Left Ear: External ear normal.  Mouth/Throat: Oropharynx is clear and moist. No oropharyngeal exudate.  Eyes: Conjunctivae and EOM are normal. Pupils are equal, round, and  reactive to light.  Neck: Normal range of motion. Neck supple. No thyromegaly present.  Cardiovascular: Normal rate, regular rhythm, normal heart sounds and intact distal pulses.  Exam reveals no gallop.   No murmur heard. Pulmonary/Chest: Effort normal and breath sounds normal. No respiratory distress. She has no wheezes. She has no rales.  Abdominal: Soft. There is no tenderness.  Genitourinary:       Breasts without mass or lesions  Musculoskeletal: She exhibits no edema and no tenderness.  Lymphadenopathy:    She has no cervical adenopathy.    She has no axillary adenopathy.  Neurological: She is alert and oriented to person, place, and time.  Skin: No rash noted. No erythema.  Psychiatric: She has a normal mood and affect. Her behavior is normal. Thought content normal.          Assessment & Plan:

## 2012-04-04 NOTE — Patient Instructions (Signed)
Please use the flovent every day---take 2 puffs with a spacer

## 2012-04-04 NOTE — Assessment & Plan Note (Signed)
Possibly persistent symptoms with wheezing and DOE like going up steps Spirometry normal Will give samples of flovent 44 so that she can use it daily (albeit at low dose)

## 2012-04-04 NOTE — Assessment & Plan Note (Signed)
Generally healthy Limited activity---will try to be more aggressive with asthma Rx Ready for colonoscopy--will set up Discussed mammo can be every 2 years if she prefers

## 2012-04-05 LAB — BASIC METABOLIC PANEL
CO2: 23 mmol/L (ref 19–28)
Calcium: 9.6 mg/dL (ref 8.6–10.2)
Chloride: 102 mmol/L (ref 97–108)
Glucose: 92 mg/dL (ref 65–99)
Potassium: 4.1 mmol/L (ref 3.5–5.2)
Sodium: 142 mmol/L (ref 134–144)

## 2012-04-05 LAB — CBC WITH DIFFERENTIAL/PLATELET
Basophils Absolute: 0 10*3/uL (ref 0.0–0.2)
Eos: 2 % (ref 0–7)
Immature Grans (Abs): 0 10*3/uL (ref 0.0–0.1)
Immature Granulocytes: 0 % (ref 0–2)
Lymphocytes Absolute: 2.8 10*3/uL (ref 0.7–4.5)
Lymphs: 67 % — ABNORMAL HIGH (ref 14–46)
MCHC: 32.7 g/dL (ref 31.5–35.7)
Monocytes: 7 % (ref 4–13)
Neutrophils Relative %: 23 % — ABNORMAL LOW (ref 40–74)
RDW: 13.3 % (ref 12.3–15.4)
WBC: 4.1 10*3/uL (ref 4.0–10.5)

## 2012-04-05 LAB — HEPATIC FUNCTION PANEL
ALT: 13 IU/L (ref 0–32)
Bilirubin, Direct: 0.1 mg/dL (ref 0.00–0.40)

## 2012-04-05 LAB — LIPID PANEL
Chol/HDL Ratio: 4.5 ratio units — ABNORMAL HIGH (ref 0.0–4.4)
HDL: 50 mg/dL (ref >39)
LDL Calculated: 148 mg/dL — ABNORMAL HIGH (ref 0–99)
Triglycerides: 139 mg/dL (ref 0–149)

## 2012-04-05 LAB — TSH: TSH: 0.952 u[IU]/mL (ref 0.450–4.500)

## 2012-04-06 ENCOUNTER — Encounter: Payer: Self-pay | Admitting: *Deleted

## 2012-04-24 ENCOUNTER — Ambulatory Visit: Payer: Self-pay | Admitting: Internal Medicine

## 2012-04-24 ENCOUNTER — Encounter: Payer: Self-pay | Admitting: Internal Medicine

## 2012-04-25 ENCOUNTER — Encounter: Payer: Self-pay | Admitting: *Deleted

## 2012-05-14 ENCOUNTER — Ambulatory Visit (AMBULATORY_SURGERY_CENTER): Payer: 59 | Admitting: *Deleted

## 2012-05-14 ENCOUNTER — Encounter: Payer: Self-pay | Admitting: Gastroenterology

## 2012-05-14 VITALS — Ht 64.0 in | Wt 191.2 lb

## 2012-05-14 DIAGNOSIS — Z1211 Encounter for screening for malignant neoplasm of colon: Secondary | ICD-10-CM

## 2012-05-14 MED ORDER — MOVIPREP 100 G PO SOLR: ORAL | Status: DC

## 2012-05-23 ENCOUNTER — Other Ambulatory Visit: Payer: Self-pay | Admitting: Internal Medicine

## 2012-05-28 ENCOUNTER — Encounter: Payer: Self-pay | Admitting: Gastroenterology

## 2012-05-28 ENCOUNTER — Ambulatory Visit (AMBULATORY_SURGERY_CENTER): Payer: 59 | Admitting: Gastroenterology

## 2012-05-28 VITALS — BP 112/71 | HR 63 | Temp 97.7°F | Resp 19 | Ht 64.0 in | Wt 191.0 lb

## 2012-05-28 DIAGNOSIS — Z1211 Encounter for screening for malignant neoplasm of colon: Secondary | ICD-10-CM

## 2012-05-28 DIAGNOSIS — D126 Benign neoplasm of colon, unspecified: Secondary | ICD-10-CM

## 2012-05-28 MED ORDER — SODIUM CHLORIDE 0.9 % IV SOLN: 500.0000 mL | INTRAVENOUS | Status: DC

## 2012-05-28 NOTE — Patient Instructions (Signed)
Colon polyp x 1 removed today. See handout given. Resume current medications. Call us with any questions or concerns. Thank you!!  YOU HAD AN ENDOSCOPIC PROCEDURE TODAY AT THE [facility]: Refer to the procedure report that was given to you for any specific questions about what was found during the examination.  If the procedure report does not answer your questions, please call your gastroenterologist to clarify.  If you requested that your care partner not be given the details of your procedure findings, then the procedure report has been included in a sealed envelope for you to review at your convenience later.  YOU SHOULD EXPECT: Some feelings of bloating in the abdomen. Passage of more gas than usual.  Walking can help get rid of the air that was put into your GI tract during the procedure and reduce the bloating. If you had a lower endoscopy (such as a colonoscopy or flexible sigmoidoscopy) you may notice spotting of blood in your stool or on the toilet paper. If you underwent a bowel prep for your procedure, then you may not have a normal bowel movement for a few days.  DIET: Your first meal following the procedure should be a light meal and then it is ok to progress to your normal diet.  A half-sandwich or bowl of soup is an example of a good first meal.  Heavy or fried foods are harder to digest and may make you feel nauseous or bloated.  Likewise meals heavy in dairy and vegetables can cause extra gas to form and this can also increase the bloating.  Drink plenty of fluids but you should avoid alcoholic beverages for 24 hours.  ACTIVITY: Your care partner should take you home directly after the procedure.  You should plan to take it easy, moving slowly for the rest of the day.  You can resume normal activity the day after the procedure however you should NOT DRIVE or use heavy machinery for 24 hours (because of the sedation medicines used during the test).    SYMPTOMS TO REPORT  IMMEDIATELY: A gastroenterologist can be reached at any hour.  During normal business hours, 8:30 AM to 5:00 PM Monday through Friday, call [phone].  After hours and on weekends, please call the GI answering service at [phone] who will take a message and have the physician on call contact you.   Following lower endoscopy (colonoscopy or flexible sigmoidoscopy):  Excessive amounts of blood in the stool  Significant tenderness or worsening of abdominal pains  Swelling of the abdomen that is new, acute  Fever of 100F or higher  FOLLOW UP: If any biopsies were taken you will be contacted by phone or by letter within the next 1-3 weeks.  Call your gastroenterologist if you have not heard about the biopsies in 3 weeks.  Our staff will call the home number listed on your records the next business day following your procedure to check on you and address any questions or concerns that you may have at that time regarding the information given to you following your procedure. This is a courtesy call and so if there is no answer at the home number and we have not heard from you through the emergency physician on call, we will assume that you have returned to your regular daily activities without incident.  SIGNATURES/CONFIDENTIALITY: You and/or your care partner have signed paperwork which will be entered into your electronic medical record.  These signatures attest to the fact that that  the information above on your After Visit Summary has been reviewed and is understood.  Full responsibility of the confidentiality of this discharge information lies with you and/or your care-partner.

## 2012-05-28 NOTE — Progress Notes (Signed)
Pressure applied to abdomen to reach the cecum 

## 2012-05-28 NOTE — Progress Notes (Signed)
Patient did not experience any of the following events: a burn prior to discharge; a fall within the facility; wrong site/side/patient/procedure/implant event; or a hospital transfer or hospital admission upon discharge from the facility. (G8907) Patient did not have preoperative order for IV antibiotic SSI prophylaxis. (G8918)  

## 2012-05-28 NOTE — Op Note (Signed)
[facility] [address] [place], [address]   COLONOSCOPY PROCEDURE REPORT  PATIENT: Natalie, [name]  MR#: [id number] BIRTHDATE: [birth year] , 61  yrs. old GENDER: Female ENDOSCOPIST: Meryl Dare, MD, [facility] REFERRED [id number] Alphonsus Sias, M.D. PROCEDURE DATE:  05/28/2012 PROCEDURE:   Colonoscopy with snare polypectomy ASA CLASS:   Class II INDICATIONS:average risk screening. MEDICATIONS: MAC sedation, administered by CRNA and propofol (Diprivan) 200mg  IV DESCRIPTION OF PROCEDURE:   After the risks benefits and alternatives of the procedure were thoroughly explained, informed consent was obtained.  A digital rectal exam revealed no abnormalities of the rectum.   The LB [id number]  endoscope was introduced through the anus and advanced to the cecum, which was identified by both the appendix and ileocecal valve. No adverse events experienced.   The quality of the prep was good, using MoviPrep  The instrument was then slowly withdrawn as the colon was fully examined.   COLON FINDINGS: A sessile polyp measuring 6 mm in size was found at the cecum.  A polypectomy was performed with a cold snare.  A polypectomy was performed with a cold snare.  The resection was complete and the polyp tissue was completely retrieved.   The colon was otherwise normal.  There was no diverticulosis, inflammation, polyps or cancers unless previously stated.  Retroflexed views revealed no abnormalities. The time to cecum=4 minutes 07 seconds. Withdrawal time=10 minutes 10 seconds.  The scope was withdrawn and the procedure completed.  COMPLICATIONS: There were no complications.  ENDOSCOPIC IMPRESSION: 1.   Sessile polyp at the cecum; polypectomy was performed with a cold snare 2.   The colon was otherwise normal  RECOMMENDATIONS: 1.  await pathology results 2.  Repeat colonoscopy in 5 years if polyp adenomatous; otherwise 10 years   eSigned:  Meryl Dare, MD,  [facility] 05/28/2012 10:21 AM

## 2012-05-29 ENCOUNTER — Telehealth: Payer: Self-pay | Admitting: *Deleted

## 2012-05-29 NOTE — Telephone Encounter (Signed)
  Follow up Call-  Call back number 05/28/2012  Post procedure Call Back phone  # [phone]  Permission to leave phone message Yes     Patient questions:  Do you have a fever, pain , or abdominal swelling? no Pain Score  0 *  Have you tolerated food without any problems? yes  Have you been able to return to your normal activities? yes  Do you have any questions about your discharge instructions: Diet   no Medications  no Follow up visit  no  Do you have questions or concerns about your Care? no  Actions: * If pain score is 4 or above: No action needed, pain <4.

## 2012-05-31 ENCOUNTER — Encounter: Payer: Self-pay | Admitting: Gastroenterology

## 2012-09-27 ENCOUNTER — Encounter: Payer: Self-pay | Admitting: Family Medicine

## 2012-09-27 ENCOUNTER — Ambulatory Visit (INDEPENDENT_AMBULATORY_CARE_PROVIDER_SITE_OTHER): Payer: 59 | Admitting: Family Medicine

## 2012-09-27 VITALS — BP 120/72 | HR 73 | Temp 98.2°F | Ht 64.0 in | Wt 189.8 lb

## 2012-09-27 DIAGNOSIS — J45901 Unspecified asthma with (acute) exacerbation: Secondary | ICD-10-CM

## 2012-09-27 MED ORDER — AZITHROMYCIN 250 MG PO TABS: ORAL_TABLET | ORAL | Status: DC

## 2012-09-27 MED ORDER — HYDROCODONE-HOMATROPINE 5-1.5 MG/5ML PO SYRP: ORAL_SOLUTION | ORAL | Status: DC

## 2012-09-27 NOTE — Progress Notes (Signed)
Nature conservation officer at [facility] [address] [place] [address] Phone: [phone] Fax: [phone]  Date:  09/27/2012   Name:  Natalie [name]   DOB:  1950/08/25   MRN:  [id number] Gender: female Age: 62 y.o.  Primary Physician:  Tillman Abide, MD  Evaluating MD: Hannah Beat, MD   Chief Complaint: Cough and Nasal Congestion   History of Present Illness:  DENIM START is a 62 y.o. pleasant patient who presents with the following:  Having a cough and congested in her chest at work. Up at night. Drainage. Has been feeling bad around 5 days. Chills has improved. Having some wheezing also. She has been having to use her albuterol more than she typically does. She has been afebrile. Eating and drinking normally. No nausea, vomiting, diarrhea. She also has some nasal and sinus drainage. Some ear fullness.  Using flovent - maybe once a day    Patient Active Problem List  Diagnosis  . HYPERLIPIDEMIA  . ALLERGIC RHINITIS  . ASTHMA  . GERD  . Routine general medical examination at a health care facility  . Osteoarthritis, multiple sites    Past Medical History  Diagnosis Date  . Allergy   . Asthma   . Hyperlipidemia   . GERD (gastroesophageal reflux disease)   . Heart murmur     was told years ago  . Cataract   . History of shingles 2006    Past Surgical History  Procedure Laterality Date  . Abdominal hysterectomy    . Vaginal delivery      x2    History  Substance Use Topics  . Smoking status: Never Smoker   . Smokeless tobacco: Never Used  . Alcohol Use: No    Family History  Problem Relation Age of Onset  . Arthritis Mother   . Diabetes Mother   . Hypertension Mother   . Heart disease Neg Hx   . Colon cancer Father 35    No Known Allergies  Medication list has been reviewed and updated.  Outpatient Prescriptions Prior to Visit  Medication Sig Dispense Refill  . albuterol (VENTOLIN HFA) 108 (90 BASE) MCG/ACT inhaler  Inhale 2 puffs into the lungs every 6 (six) hours as needed for wheezing.  1 Inhaler  5  . cetirizine (ZYRTEC) 10 MG tablet Take 10 mg by mouth daily.        . fluticasone (FLOVENT HFA) 44 MCG/ACT inhaler Inhale 2 puffs into the lungs daily.      Marland Kitchen QVAR 80 MCG/ACT inhaler INHALE ONE PUFF INTO THE LUNGS AS NEEDED  9 g  11  . traMADol (ULTRAM) 50 MG tablet Take 1 tablet (50 mg total) by mouth every 6 (six) hours as needed for pain.  50 tablet  1   No facility-administered medications prior to visit.    Review of Systems:  ROS: GEN: Acute illness details above GI: Tolerating PO intake GU: maintaining adequate hydration and urination Pulm: No SOB Interactive and getting along well at home.  Otherwise, ROS is as per the HPI.   Physical Examination: BP 120/72  Pulse 73  Temp(Src) 98.2 F (36.8 C) (Oral)  Ht 5\' 4"  (1.626 m)  Wt 189 lb 12 oz (86.07 kg)  BMI 32.55 kg/m2  SpO2 98%  Ideal Body Weight: Weight in (lb) to have BMI = 25: 145.3   GEN: A and O x 3. WDWN. NAD.    ENT: Nose clear, ext NML.  No LAD.  No  JVD.  TM's clear. Oropharynx clear.  PULM: Normal WOB, no distress. No crackles, wheezes, rhonchi. CV: RRR, no M/G/R, No rubs, No JVD.   EXT: warm and well-perfused, No c/c/e. PSYCH: Pleasant and conversant.   Assessment and Plan:  Acute bronchitis  Asthma with acute exacerbation  By history, some mild asthma exacerbation, but I think that we should be okay using when necessary albuterol, and we will cover her with antibiotics and give her some when necessary cough syrup.  Orders Today:  No orders of the defined types were placed in this encounter.    Updated Medication List: (Includes new medications, updates to list, dose adjustments) Meds ordered this encounter  Medications  . azithromycin (ZITHROMAX) 250 MG tablet    Sig: 2 tabs po on day 1, then 1 tab po for 4 days    Dispense:  6 tablet    Refill:  0  . HYDROcodone-homatropine (HYCODAN) 5-1.5 MG/5ML syrup     Sig: 1 tsp po at night before bed prn cough    Dispense:  240 mL    Refill:  0    Medications Discontinued: There are no discontinued medications.    Signed, Elpidio Galea. Dalaina Tates, MD 09/27/2012 11:43 AM

## 2013-04-03 ENCOUNTER — Ambulatory Visit (INDEPENDENT_AMBULATORY_CARE_PROVIDER_SITE_OTHER): Payer: 59 | Admitting: Internal Medicine

## 2013-04-03 ENCOUNTER — Encounter: Payer: Self-pay | Admitting: Internal Medicine

## 2013-04-03 ENCOUNTER — Ambulatory Visit (INDEPENDENT_AMBULATORY_CARE_PROVIDER_SITE_OTHER)
Admission: RE | Admit: 2013-04-03 | Discharge: 2013-04-03 | Disposition: A | Discharge status: Home / Self Care | Payer: 59 | Source: Ambulatory Visit | Attending: Internal Medicine | Admitting: Internal Medicine

## 2013-04-03 VITALS — BP 124/88 | HR 82 | Temp 98.0°F | Ht 63.25 in | Wt 186.8 lb

## 2013-04-03 DIAGNOSIS — J45909 Unspecified asthma, uncomplicated: Secondary | ICD-10-CM

## 2013-04-03 DIAGNOSIS — Z5189 Encounter for other specified aftercare: Secondary | ICD-10-CM

## 2013-04-03 DIAGNOSIS — Z0000 Encounter for general adult medical examination without abnormal findings: Secondary | ICD-10-CM

## 2013-04-03 DIAGNOSIS — S5011XD Contusion of right forearm, subsequent encounter: Secondary | ICD-10-CM

## 2013-04-03 DIAGNOSIS — S5011XA Contusion of right forearm, initial encounter: Secondary | ICD-10-CM | POA: Insufficient documentation

## 2013-04-03 MED ORDER — FLUTICASONE PROPIONATE HFA 44 MCG/ACT IN AERO: 2.0000 | INHALATION_SPRAY | Freq: Every day | RESPIRATORY_TRACT | Status: DC

## 2013-04-03 MED ORDER — ALBUTEROL SULFATE HFA 108 (90 BASE) MCG/ACT IN AERS: 2.0000 | INHALATION_SPRAY | Freq: Four times a day (QID) | RESPIRATORY_TRACT | Status: DC | PRN

## 2013-04-03 NOTE — Assessment & Plan Note (Signed)
Mild and intermittent No changes needed  Will check CXR due to AM blood tinged sputum---sounds clearly from her nose though

## 2013-04-03 NOTE — Patient Instructions (Signed)
Please get a copy of your recent blood work for me.

## 2013-04-03 NOTE — Assessment & Plan Note (Signed)
Generally healthy Info given on proper eating Can get mammo every other year

## 2013-04-03 NOTE — Assessment & Plan Note (Signed)
Still some swelling but not tender Will use the brace just for protection (mostly at work)
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
## 2013-04-04 ENCOUNTER — Encounter: Payer: Self-pay | Admitting: *Deleted

## 2013-04-25 ENCOUNTER — Ambulatory Visit: Payer: Self-pay | Admitting: Internal Medicine

## 2013-04-25 ENCOUNTER — Encounter: Payer: Self-pay | Admitting: *Deleted

## 2013-04-25 ENCOUNTER — Encounter: Payer: Self-pay | Admitting: Internal Medicine

## 2013-05-16 ENCOUNTER — Ambulatory Visit (INDEPENDENT_AMBULATORY_CARE_PROVIDER_SITE_OTHER): Payer: 59 | Admitting: Internal Medicine

## 2013-05-16 ENCOUNTER — Encounter: Payer: Self-pay | Admitting: Internal Medicine

## 2013-05-16 VITALS — BP 112/64 | HR 90 | Temp 98.9°F | Wt 188.5 lb

## 2013-05-16 DIAGNOSIS — M25539 Pain in unspecified wrist: Secondary | ICD-10-CM

## 2013-05-16 DIAGNOSIS — M25531 Pain in right wrist: Secondary | ICD-10-CM
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
## 2013-05-16 NOTE — Patient Instructions (Signed)
Please set up appointment with Dr Patsy Lager to evaluate your right wrist
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
## 2013-05-29 ENCOUNTER — Encounter: Payer: Self-pay | Admitting: Family Medicine

## 2013-05-29 ENCOUNTER — Ambulatory Visit (INDEPENDENT_AMBULATORY_CARE_PROVIDER_SITE_OTHER): Payer: 59 | Admitting: Family Medicine

## 2013-05-29 ENCOUNTER — Ambulatory Visit (INDEPENDENT_AMBULATORY_CARE_PROVIDER_SITE_OTHER)
Admission: RE | Admit: 2013-05-29 | Discharge: 2013-05-29 | Disposition: A | Discharge status: Home / Self Care | Payer: 59 | Source: Ambulatory Visit | Attending: Family Medicine | Admitting: Family Medicine

## 2013-05-29 VITALS — BP 126/80 | HR 67 | Temp 98.4°F | Ht 63.25 in | Wt 190.5 lb

## 2013-05-29 DIAGNOSIS — M25539 Pain in unspecified wrist: Secondary | ICD-10-CM

## 2013-05-29 DIAGNOSIS — M25531 Pain in right wrist: Secondary | ICD-10-CM

## 2013-05-29 DIAGNOSIS — S63599A Other specified sprain of unspecified wrist, initial encounter: Secondary | ICD-10-CM

## 2013-05-29 DIAGNOSIS — S63519A Sprain of carpal joint of unspecified wrist, initial encounter: Secondary | ICD-10-CM

## 2013-05-29 DIAGNOSIS — M129 Arthropathy, unspecified: Secondary | ICD-10-CM

## 2013-05-29 NOTE — Patient Instructions (Signed)
REFERRAL: GO THE THE FRONT ROOM AT THE ENTRANCE OF OUR CLINIC, NEAR CHECK IN. ASK FOR Natalie [name]. SHE WILL HELP YOU SET UP YOUR REFERRAL. DATE: TIME:  
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
## 2013-06-14 ENCOUNTER — Ambulatory Visit
Admission: RE | Admit: 2013-06-14 | Discharge: 2013-06-14 | Disposition: A | Discharge status: Home / Self Care | Payer: 59 | Source: Ambulatory Visit | Attending: Family Medicine | Admitting: Family Medicine

## 2013-06-14 DIAGNOSIS — M25531 Pain in right wrist: Secondary | ICD-10-CM

## 2013-06-14 MED ORDER — IOHEXOL 180 MG/ML  SOLN
3.0000 mL | Freq: Once | INTRAMUSCULAR | Status: AC | PRN
  Administered 2013-06-14: 3 mL via INTRA_ARTICULAR

## 2013-06-16 NOTE — Addendum Note (Signed)
Addended by: Hannah Beat on: 06/16/2013 03:20 PM   Modules accepted: Orders

## 2013-06-17 ENCOUNTER — Telehealth: Payer: Self-pay | Admitting: Radiology

## 2013-06-17 DIAGNOSIS — M129 Arthropathy, unspecified: Secondary | ICD-10-CM

## 2013-06-17 NOTE — Telephone Encounter (Signed)
LMOM for patient to call back( needs to schedule a Lab appt per Dr Patsy Lager)

## 2013-06-17 NOTE — Telephone Encounter (Signed)
Patient called back and scheduled a lab appt

## 2013-06-18 NOTE — Addendum Note (Signed)
Addended by: Baldomero Lamy on: 06/18/2013 08:29 AM   Modules accepted: Orders

## 2013-06-18 NOTE — Telephone Encounter (Signed)
ccp ordered as future orders for [facility].

## 2013-06-19 ENCOUNTER — Other Ambulatory Visit (INDEPENDENT_AMBULATORY_CARE_PROVIDER_SITE_OTHER): Payer: 59

## 2013-06-19 DIAGNOSIS — M129 Arthropathy, unspecified: Secondary | ICD-10-CM

## 2013-06-21 LAB — URIC ACID: Uric Acid: 4.2 mg/dL (ref 2.5–7.1)

## 2013-06-21 LAB — HIGH SENSITIVITY CRP: CRP, High Sensitivity: 33.41 mg/L — ABNORMAL HIGH (ref 0.00–3.00)

## 2013-06-21 LAB — RHEUMATOID FACTOR: Rhuematoid fact SerPl-aCnc: 9.8 IU/mL (ref 0.0–13.9)

## 2013-06-21 LAB — ANA: Anti Nuclear Antibody(ANA): NEGATIVE

## 2013-06-22 LAB — CYCLIC CITRUL PEPTIDE ANTIBODY, IGG/IGA: Cyclic Citrullin Peptide Ab: 4 units (ref 0–19)

## 2013-06-24 ENCOUNTER — Telehealth: Payer: Self-pay | Admitting: Radiology

## 2013-06-24 NOTE — Telephone Encounter (Signed)
CRP was high Not sure what else this would add Will send to Dr Patsy Lager

## 2013-06-24 NOTE — Telephone Encounter (Signed)
[facility] called today to inform us that this patients Natalie [name] was not done on the 13th, sample was to old. The patient was a late draw on the 12th, [facility] picked up 24 hrs later.

## 2013-09-16 ENCOUNTER — Other Ambulatory Visit: Payer: Self-pay

## 2013-09-16 MED ORDER — ALBUTEROL SULFATE HFA 108 (90 BASE) MCG/ACT IN AERS: 2.0000 | INHALATION_SPRAY | Freq: Four times a day (QID) | RESPIRATORY_TRACT | Status: DC | PRN

## 2013-09-16 NOTE — Telephone Encounter (Signed)
Pt left v/m requesting refill ventolin inhaler to [facility]. Pt advised done.

## 2013-09-17 NOTE — Telephone Encounter (Signed)
Pt left v/m and pt received one inhaler from pharmacy. I left v/m as instructed by pt that just sent refill for ventolin inhaler on 09/16/13 for 3 inhalers with one refill. Pt could not have gotten refill from the electronic submission from 09/16/13 this quickly. Advised pt to cb if needed.

## 2014-01-08 ENCOUNTER — Ambulatory Visit (INDEPENDENT_AMBULATORY_CARE_PROVIDER_SITE_OTHER): Payer: 59 | Admitting: Family Medicine

## 2014-01-08 ENCOUNTER — Encounter: Payer: Self-pay | Admitting: Family Medicine

## 2014-01-08 VITALS — BP 132/78 | HR 73 | Temp 97.9°F | Ht 63.25 in | Wt 189.8 lb

## 2014-01-08 DIAGNOSIS — M24139 Other articular cartilage disorders, unspecified wrist: Secondary | ICD-10-CM

## 2014-01-08 DIAGNOSIS — IMO0002 Reserved for concepts with insufficient information to code with codable children: Secondary | ICD-10-CM

## 2014-01-08 DIAGNOSIS — M25539 Pain in unspecified wrist: Secondary | ICD-10-CM

## 2014-01-08 DIAGNOSIS — S63519A Sprain of carpal joint of unspecified wrist, initial encounter: Secondary | ICD-10-CM

## 2014-01-08 DIAGNOSIS — S63599A Other specified sprain of unspecified wrist, initial encounter: Secondary | ICD-10-CM

## 2014-01-08 DIAGNOSIS — M25531 Pain in right wrist: Secondary | ICD-10-CM

## 2014-01-08 DIAGNOSIS — M129 Arthropathy, unspecified: Secondary | ICD-10-CM

## 2014-01-08 NOTE — Progress Notes (Signed)
Pre visit review using our clinic review tool, if applicable. No additional management support is needed unless otherwise documented below in the visit note. 
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
## 2014-01-28 ENCOUNTER — Telehealth: Payer: Self-pay | Admitting: Internal Medicine

## 2014-01-28 NOTE — Telephone Encounter (Signed)
Pt stopped by to inquire to see if medical records have been received from [facility]. Please call pt to let her know. Thank you!

## 2014-01-28 NOTE — Telephone Encounter (Signed)
Ms. Moll notified records have not been received yet from [facility].

## 2014-01-28 NOTE — Telephone Encounter (Signed)
To my knowledge, we have not. I will check again tomorrow.

## 2014-01-29 NOTE — Telephone Encounter (Signed)
Vaughan Basta, can you please check on the status of medical records from [facility].  Record release was signed on 01/08/2014.

## 2014-01-29 NOTE — Telephone Encounter (Signed)
Can we request again for this patient?

## 2014-02-06 NOTE — Telephone Encounter (Signed)
[facility] faxed over records 02/06/14. Pt informed and will pick up at front desk.

## 2014-02-06 NOTE — Telephone Encounter (Signed)
Pt came back in office today asking if we have received [facility] notes. I spoke with Butch Penny and she stated she had not seen any. I had Ms. Parcher fill out another request for her records from 2014 to 2015 be sent here from [place]. I left message for Estill Bamberg in medical records at [facility] to see if we can expedite our request to obtain pt medical records.

## 2014-04-04 ENCOUNTER — Ambulatory Visit (INDEPENDENT_AMBULATORY_CARE_PROVIDER_SITE_OTHER): Payer: Medicare Other | Admitting: Internal Medicine

## 2014-04-04 ENCOUNTER — Encounter: Payer: Self-pay | Admitting: Internal Medicine

## 2014-04-04 VITALS — BP 118/80 | HR 78 | Temp 97.5°F | Ht 62.75 in | Wt 188.0 lb

## 2014-04-04 DIAGNOSIS — J45909 Unspecified asthma, uncomplicated: Secondary | ICD-10-CM

## 2014-04-04 DIAGNOSIS — E785 Hyperlipidemia, unspecified: Secondary | ICD-10-CM

## 2014-04-04 DIAGNOSIS — J309 Allergic rhinitis, unspecified: Secondary | ICD-10-CM

## 2014-04-04 DIAGNOSIS — Z780 Asymptomatic menopausal state: Secondary | ICD-10-CM

## 2014-04-04 DIAGNOSIS — Z7189 Other specified counseling: Secondary | ICD-10-CM | POA: Insufficient documentation

## 2014-04-04 DIAGNOSIS — K219 Gastro-esophageal reflux disease without esophagitis: Secondary | ICD-10-CM

## 2014-04-04 DIAGNOSIS — J4530 Mild persistent asthma, uncomplicated: Secondary | ICD-10-CM

## 2014-04-04 DIAGNOSIS — Z0000 Encounter for general adult medical examination without abnormal findings: Secondary | ICD-10-CM

## 2014-04-04 LAB — CBC WITH DIFFERENTIAL/PLATELET
Basophils Absolute: 0.1 10*3/uL (ref 0.0–0.1)
Basophils Relative: 1.6 % (ref 0.0–3.0)
EOS PCT: 1.6 % (ref 0.0–5.0)
Eosinophils Absolute: 0.1 10*3/uL (ref 0.0–0.7)
HCT: 42.8 % (ref 36.0–46.0)
Hemoglobin: 14 g/dL (ref 12.0–15.0)
LYMPHS PCT: 54 % — AB (ref 12.0–46.0)
Lymphs Abs: 2.6 10*3/uL (ref 0.7–4.0)
MCHC: 32.6 g/dL (ref 30.0–36.0)
MCV: 90.7 fl (ref 78.0–100.0)
Monocytes Absolute: 0.3 10*3/uL (ref 0.1–1.0)
Monocytes Relative: 7 % (ref 3.0–12.0)
NEUTROS PCT: 35.8 % — AB (ref 43.0–77.0)
Neutro Abs: 1.7 10*3/uL (ref 1.4–7.7)
PLATELETS: 253 10*3/uL (ref 150.0–400.0)
RBC: 4.73 Mil/uL (ref 3.87–5.11)
RDW: 13.7 % (ref 11.5–15.5)
WBC: 4.9 10*3/uL (ref 4.0–10.5)

## 2014-04-04 LAB — LIPID PANEL
CHOLESTEROL: 235 mg/dL — AB (ref 0–200)
HDL: 48.6 mg/dL (ref >39.00)
LDL Cholesterol: 148 mg/dL — ABNORMAL HIGH (ref 0–99)
NonHDL: 186.4
TRIGLYCERIDES: 190 mg/dL — AB (ref 0.0–149.0)
Total CHOL/HDL Ratio: 5
VLDL: 38 mg/dL (ref 0.0–40.0)

## 2014-04-04 LAB — COMPREHENSIVE METABOLIC PANEL
ALT: 13 U/L (ref 0–35)
AST: 22 U/L (ref 0–37)
Albumin: 3.7 g/dL (ref 3.5–5.2)
Alkaline Phosphatase: 56 U/L (ref 39–117)
BUN: 9 mg/dL (ref 6–23)
CALCIUM: 9.4 mg/dL (ref 8.4–10.5)
CO2: 31 meq/L (ref 19–32)
Chloride: 103 mEq/L (ref 96–112)
Creatinine, Ser: 0.7 mg/dL (ref 0.4–1.2)
GFR: 102.82 mL/min (ref >60.00)
GLUCOSE: 80 mg/dL (ref 70–99)
POTASSIUM: 4 meq/L (ref 3.5–5.1)
SODIUM: 141 meq/L (ref 135–145)
TOTAL PROTEIN: 6.9 g/dL (ref 6.0–8.3)
Total Bilirubin: 0.7 mg/dL (ref 0.2–1.2)

## 2014-04-04 LAB — T4, FREE: Free T4: 0.95 ng/dL (ref 0.60–1.60)

## 2014-04-04 NOTE — Assessment & Plan Note (Signed)
See social history 

## 2014-04-04 NOTE — Assessment & Plan Note (Signed)
She doesn't have any symptoms Doesn't seem to be an issue with her asthma

## 2014-04-04 NOTE — Assessment & Plan Note (Signed)
Discussed primary prevention She prefers no statin

## 2014-04-04 NOTE — Progress Notes (Signed)
Subjective:    Patient ID: Natalie [name], female    DOB: 03/05/1951, 63 y.o.   MRN: [id number]  HPI Here for Welcome to Medicare visit and follow up Reviewed advanced directives No surgery in past year No other doctors other than Dr [name] for eyes No falls No depression or anhedonia Hearing and vision are okay No tobacco or alchol No regular exercise Still works full time--- independent with instrumental ADLs No cognitive changes  Wrist is some better Had imaging done and had tear Got injection by ortho and is some better No surgery planned  Does have wheezing at night Notes some wheezing and SOB going up 2 flights of steps at work May get set off in [employer]--? The chemicals Has not been consistent with flovent Has been using the albuterol if SOB-- not daily  Only occasional cough--- allergy related mostly  Has some stuffiness in ear and nose Uses the zyrtec every night  No heartburn problems She isn't sure she has GERD No swallowing problems  Reviewed her cholesterol  Isn't careful with diet--- eats fried food Not excited about primary prevention  Current Outpatient Prescriptions on File Prior to Visit  Medication Sig Dispense Refill  . albuterol (VENTOLIN HFA) 108 (90 BASE) MCG/ACT inhaler Inhale 2 puffs into the lungs every 6 (six) hours as needed for wheezing.  3 Inhaler  1  . cetirizine (ZYRTEC) 10 MG tablet Take 10 mg by mouth daily.        . fluticasone (FLOVENT HFA) 44 MCG/ACT inhaler Inhale 2 puffs into the lungs daily.  3 Inhaler  3  . ibuprofen (ADVIL,MOTRIN) 100 MG tablet Take 100 mg by mouth every 6 (six) hours as needed for fever.       No current facility-administered medications on file prior to visit.    No Known Allergies  Past Medical History  Diagnosis Date  . Allergy   . Asthma   . Hyperlipidemia   . GERD (gastroesophageal reflux disease)   . Heart murmur     was told years ago  . Cataract   . History of shingles 2006     Past Surgical History  Procedure Laterality Date  . Abdominal hysterectomy    . Vaginal delivery      x2    Family History  Problem Relation Age of Onset  . Arthritis Mother   . Diabetes Mother   . Hypertension Mother   . Heart disease Neg Hx   . Colon cancer Father 84    History   Social History  . Marital Status: Single    Spouse Name: N/A    Number of Children: 2  . 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 Smoking status: Never Smoker   . Smokeless tobacco: Never Used  . Alcohol Use: No  . Drug Use: No  . Sexual Activity: Not on file   Other Topics Concern  . Not on file   Social History Narrative   No living will   Requests daughter [name] as health care POA   Not sure about resuscitation   Not sure about tube feeds   Review of Systems Sleeps well Bowels are fine No trouble with urinary incontinence Plans to see dermatologist to check her moles Appetite good--weight stable     Objective:   Physical Exam  Constitutional: She is oriented to person, place, and time. She  appears well-developed. No distress.  HENT:  Mouth/Throat: Oropharynx is clear and moist. No oropharyngeal exudate.  Neck: Normal range of motion. Neck supple. No thyromegaly present.  Cardiovascular: Normal rate, regular rhythm, normal heart sounds and intact distal pulses.  Exam reveals no gallop.   No murmur heard. Pulmonary/Chest: Effort normal and breath sounds normal. No respiratory distress. She has no wheezes. She has no rales.  Abdominal: Soft. There is no tenderness.  Genitourinary:  Mild cystic changes in both breasts  Musculoskeletal: She exhibits no edema and no tenderness.  Lymphadenopathy:    She has no cervical adenopathy.    She has no axillary adenopathy.  Neurological: She is alert and oriented to person, place, and time.  President-- "Obama, Clinton--then  Bush" [phone] [id number] Recall 2/3  Skin: No rash noted.  Psychiatric: She has a normal mood and affect. Her behavior is normal.          Assessment & Plan:

## 2014-04-04 NOTE — Assessment & Plan Note (Addendum)
Has not been compliant with flovent Symptomatic almost daily but not really using rescue inhaler Spirometry normal---will just have her take the flovent regularly

## 2014-04-04 NOTE — Assessment & Plan Note (Signed)
Usually does okay with the cetirizine If worsens, will consider adding montelukast

## 2014-04-04 NOTE — Patient Instructions (Signed)
Please take the flovent---2 puffs daily---EVERY DAY. Ask the pharmacist for a spacer and use this with both of your inhalers. Rinse your mouth out after the flovent.

## 2014-04-04 NOTE — Progress Notes (Signed)
Pre visit review using our clinic review tool, if applicable. No additional management support is needed unless otherwise documented below in the visit note. 

## 2014-04-04 NOTE — Assessment & Plan Note (Signed)
I have personally reviewed the Medicare Annual Wellness questionnaire and have noted 1. The patient's medical and social history 2. Their use of alcohol, tobacco or illicit drugs 3. Their current medications and supplements 4. The patient's functional ability including ADL's, fall risks, home safety risks and hearing or visual             impairment. 5. Diet and physical activities 6. Evidence for depression or mood disorders  The patients weight, height, BMI and visual acuity have been recorded in the chart I have made referrals, counseling and provided education to the patient based review of the above and I have provided the pt with a written personalized care plan for preventive services.  I have provided you with a copy of your personalized plan for preventive services. Please take the time to review along with your updated medication list.  mammo due next year UTD on colon No pap due to age Doesn't want pneumovax or flu---will try to convince DEXA

## 2014-04-07 ENCOUNTER — Encounter: Payer: Self-pay | Admitting: *Deleted

## 2014-04-28 ENCOUNTER — Ambulatory Visit (INDEPENDENT_AMBULATORY_CARE_PROVIDER_SITE_OTHER)
Admission: RE | Admit: 2014-04-28 | Discharge: 2014-04-28 | Disposition: A | Discharge status: Home / Self Care | Payer: Medicare Other | Source: Ambulatory Visit | Attending: Internal Medicine | Admitting: Internal Medicine

## 2014-04-28 DIAGNOSIS — Z780 Asymptomatic menopausal state: Secondary | ICD-10-CM

## 2014-05-13 ENCOUNTER — Other Ambulatory Visit: Payer: Self-pay

## 2014-05-13 MED ORDER — FLUTICASONE PROPIONATE HFA 44 MCG/ACT IN AERO: 2.0000 | INHALATION_SPRAY | Freq: Every day | RESPIRATORY_TRACT | Status: DC

## 2014-05-13 NOTE — Telephone Encounter (Signed)
Pt request refill flovent to [facility] [address]. Advised pt done.

## 2014-07-30 ENCOUNTER — Other Ambulatory Visit: Payer: Self-pay

## 2014-07-30 MED ORDER — FLUTICASONE PROPIONATE HFA 44 MCG/ACT IN AERO: 2.0000 | INHALATION_SPRAY | Freq: Every day | RESPIRATORY_TRACT | Status: DC

## 2014-07-30 NOTE — Telephone Encounter (Signed)
Pt left v/m requesting printed rx for flovent; pt does not want to get filled until next week.Call when ready for pick up. Dr Silvio Pate not in office until 08/04/14.Please advise.

## 2014-07-30 NOTE — Telephone Encounter (Signed)
RX printed and signed and given to dee

## 2014-07-31 NOTE — Telephone Encounter (Signed)
Left message on machine that rx is ready for pick-up, and it will be at our front desk.  

## 2014-08-19 ENCOUNTER — Telehealth: Payer: Self-pay | Admitting: *Deleted

## 2014-08-19 NOTE — Telephone Encounter (Signed)
Left message for patient that we needed to know why she needed a handicapp form, advised pt to return my call

## 2014-08-20 NOTE — Telephone Encounter (Signed)
.  left message to have patient return my call.  

## 2014-08-21 NOTE — Telephone Encounter (Signed)
Spoke with patient and she states when she has to park far away from stores she gets SOB, she wants to park close. Per pt

## 2014-08-21 NOTE — Telephone Encounter (Signed)
She doesn't qualify for a handicapped tag for respiratory reasons. Sorry

## 2014-08-22 NOTE — Telephone Encounter (Signed)
Lm on pts vm and advised per Dr Letvak 

## 2014-10-13 ENCOUNTER — Ambulatory Visit (INDEPENDENT_AMBULATORY_CARE_PROVIDER_SITE_OTHER): Payer: Medicare Other | Admitting: Family Medicine

## 2014-10-13 ENCOUNTER — Encounter: Payer: Self-pay | Admitting: Family Medicine

## 2014-10-13 VITALS — BP 90/70 | HR 85 | Temp 97.8°F | Ht 62.75 in | Wt 189.0 lb

## 2014-10-13 DIAGNOSIS — M722 Plantar fascial fibromatosis: Secondary | ICD-10-CM | POA: Diagnosis not present

## 2014-10-13 NOTE — Patient Instructions (Signed)

## 2014-10-13 NOTE — Progress Notes (Signed)
Dr. Frederico Hamman T. Marli Diego, MD, [facility] [place], [address] Phone: [phone] Fax: [phone]  10/13/2014  Patient: Natalie [name], MRN: [id number], DOB: 03-Mar-1951, 64 y.o.  Primary Physician:  Viviana Simpler, MD  Chief Complaint: Foot Pain  Subjective:   This 64 y.o. female patient presents with a 2 mo long history of heel pain. This is notable for worsening pain first thing in the morning when arising and standing after sitting.   Prior foot or ankle fractures: none Prior operations: none Orthotics or bracing: none Medications: NSAIDs PT or home rehab: none Night splints: no Ice massage: no Ball massage: no  Metatarsal pain: no  The PMH, PSH, Social History, Family History, Medications, and allergies have been reviewed in [facility], and have been updated if relevant.  GEN: No fevers, chills. Nontoxic. Primarily MSK c/o today. MSK: Detailed in the HPI GI: tolerating PO intake without difficulty Neuro: No numbness, parasthesias, or tingling associated. Otherwise the pertinent positives of the ROS are noted above.   Objective:   Blood pressure 90/70, pulse 85, temperature 97.8 F (36.6 C), temperature source Oral, height 5' 2.75" (1.594 m), weight 189 lb (85.73 kg).  GEN: Well-developed,well-nourished,in no acute distress; alert,appropriate and cooperative throughout examination HEENT: Normocephalic and atraumatic without obvious abnormalities. Ears, externally no deformities PULM: Breathing comfortably in no respiratory distress EXT: No clubbing, cyanosis, or edema PSYCH: Normally interactive. Cooperative during the interview. Pleasant. Friendly and conversant. Not anxious or depressed appearing. Normal, full affect.  L heel Echymosis: no Edema: no ROM: full LE B Gait: heel toe, non-antalgic MT pain: no Callus pattern: none Lateral Mall: NT Medial Mall: NT Talus: NT Navicular: NT Calcaneous:  NT Metatarsals: NT 5th MT: NT Phalanges: NT Achilles: NT Plantar Fascia: tender, medial along PF. Pain with forced dorsi Fat Pad: NT Peroneals: NT Post Tib: NT Great Toe: Nml motion Ant Drawer: neg Other foot breakdown: none Long arch: preserved Transverse arch: preserved Hindfoot breakdown: none Sensation: intact  Assessment and Plan:   Plantar fascia syndrome  Anatomy reviewed. Stretching and rehab are critically important to the treatment of PF. Reviewed footwear. Rigid soles have been shown to help with PF.  Reviewed rehab of stretching and calf raises.  Reviewed rehab from [facility]  Could benefit from a corticosteroid injection if conservative treatment fails.  Follow-up: 2 months if no improvement  Patient Instructions  Please read handouts on Plantar Fascitis.  STRETCHING and Strengthening program critically important.  Strengthening on foot and calf muscles as seen in handout. Calf raises, 2 legged, then 1 legged. Foot massage with tennis ball. Ice massage.  Towel Scrunches: get a towel or hand towel, use toes to pick up and scrunch up the towel.  Marble pick-ups, practice picking up marbles with toes and placing into a cup  NEEDS TO BE DONE EVERY DAY  Recommended over the counter insoles. (Spenco or Hapad)  A rigid shoe with good arch support helps: Dansko (great), Jennet Maduro, Merrell No easily bendable shoes.   Tuli's heel cups       Signed,  Australia Droll T. Jeana Kersting, MD   Patient's Medications  New Prescriptions   No medications on file  Previous Medications   ALBUTEROL (VENTOLIN HFA) 108 (90 BASE) MCG/ACT INHALER    Inhale 2 puffs into the lungs every 6 (six) hours as needed for wheezing.   CETIRIZINE (ZYRTEC) 10 MG TABLET    Take 10 mg by  mouth daily.     FLUTICASONE (FLOVENT HFA) 44 MCG/ACT INHALER    Inhale 2 puffs into the lungs daily.   IBUPROFEN (ADVIL,MOTRIN) 100 MG TABLET    Take 100 mg by mouth every 6  (six) hours as needed for fever.  Modified Medications   No medications on file  Discontinued Medications   No medications on file

## 2014-10-13 NOTE — Progress Notes (Signed)
Pre visit review using our clinic review tool, if applicable. No additional management support is needed unless otherwise documented below in the visit note. 

## 2014-10-25 ENCOUNTER — Other Ambulatory Visit: Payer: Self-pay | Admitting: Internal Medicine

## 2014-11-20 ENCOUNTER — Encounter: Payer: Self-pay | Admitting: Primary Care

## 2014-11-20 ENCOUNTER — Ambulatory Visit (INDEPENDENT_AMBULATORY_CARE_PROVIDER_SITE_OTHER): Payer: Medicare Other | Admitting: Primary Care

## 2014-11-20 VITALS — BP 118/76 | HR 70 | Temp 97.6°F | Ht 63.25 in | Wt 189.4 lb

## 2014-11-20 DIAGNOSIS — R490 Dysphonia: Secondary | ICD-10-CM

## 2014-11-20 NOTE — Patient Instructions (Signed)
Your hoarseness is likely related to your upper respiratory tract infection and should pass within the next week.  Continue your daily Zyrtec. Please call me if no improvement in one week.  Hoarseness Hoarseness is produced from a variety of causes. It is important to find the cause so it can be treated. In the absence of a cold or upper respiratory illness, any hoarseness lasting more than 2 weeks should be looked at by a specialist. This is especially important if you have a history of smoking or alcohol use. It is also important to keep in mind that as you grow older, your voice will naturally get weaker, making it easier for you to become hoarse from straining your vocal cords.  CAUSES  Any illness that affects your vocal cords can result in a hoarse voice. Examples of conditions that can affect the vocal cords are listed as follows:   Allergies.  Colds.  Sinusitis.  Gastroesophageal reflux disease.  Croup.  Injury.  Nodules.  Exposure to smoke or toxic fumes or gases.  Congenital and genetic defects.  Paralysis of the vocal cords.  Infections.  Advanced age. DIAGNOSIS  In order to diagnose the cause of your hoarseness, your caregiver will examine your throat using an instrument that uses a tube with a small lighted camera (laryngoscope). It allows your caregiver to look into the mouth and down the throat. TREATMENT  For most cases, treatment will focus on the specific cause of the hoarseness. Depending on the cause, hoarseness can be a temporary condition (acute) or it can be long lasting (chronic). Most cases of hoarseness clear up without complications. Your caregiver will explain to you if this is not likely to happen. SEEK IMMEDIATE MEDICAL CARE IF:   You have increasing hoarseness or loss of voice.  You have shortness of breath.  You are coughing up blood.  There is pain in your neck or throat. Document Released: 07/08/2005 Document Revised: 10/17/2011 Document  Reviewed: 09/30/2010 [facility] Patient Information 2015 [place], [place]. This information is not intended to replace advice given to you by your health care provider. Make sure you discuss any questions you have with your health care provider.

## 2014-11-20 NOTE — Progress Notes (Signed)
Pre visit review using our clinic review tool, if applicable. No additional management support is needed unless otherwise documented below in the visit note. 

## 2014-11-20 NOTE — Progress Notes (Signed)
Subjective:    Patient ID: Natalie [name], female    DOB: 05/20/1951, 64 y.o.   MRN: [id number]  HPI  Natalie [name] is a 64 year old female who presents today with a chief complaint of hoarseness to her voice for 10 days. She reports her throat is still slightly red. Starting the last week of March she's had chest and nasal congestion which lasted about 10 days and resolved several days ago. Her symptoms have completely dissipated and she reports to be feeling much better overall, exept for the lingering hoarseness. Denies fever, chills, nausea, reflux, cough. Takes daily Zyrtec. She's unsure if she had GERD, and denies symptoms associated except for hoarseness. She is a non smoker, denies hemoptysis.   Review of Systems  Constitutional: Negative for fever and chills.  HENT: Positive for voice change. Negative for congestion, ear pain, postnasal drip, rhinorrhea, sinus pressure and sore throat.   Eyes: Negative for itching.  Respiratory: Negative for shortness of breath.   Cardiovascular: Negative for chest pain.  Neurological: Negative for dizziness.  Hematological: Negative for adenopathy.       Past Medical History  Diagnosis Date  . Allergy   . Asthma   . Hyperlipidemia   . GERD (gastroesophageal reflux disease)   . Heart murmur     was told years ago  . Cataract   . History of shingles 2006    History   Social History  . Marital Status: Single    Spouse Name: N/A  . Number of Children: 2  . 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 Smoking status: Never Smoker   . Smokeless tobacco: Never Used  . Alcohol Use: No  . Drug Use: No  . Sexual Activity: Not on file   Other Topics Concern  . Not on file   Social History Narrative   No living will   Requests daughter [name] as health care POA   Not sure about resuscitation   Not sure about tube feeds    Past Surgical  History  Procedure Laterality Date  . Abdominal hysterectomy    . Vaginal delivery      x2    Family History  Problem Relation Age of Onset  . Arthritis Mother   . Diabetes Mother   . Hypertension Mother   . Heart disease Neg Hx   . Colon cancer Father 39    No Known Allergies  Current Outpatient Prescriptions on File Prior to Visit  Medication Sig Dispense Refill  . cetirizine (ZYRTEC) 10 MG tablet Take 10 mg by mouth daily.      . fluticasone (FLOVENT HFA) 44 MCG/ACT inhaler Inhale 2 puffs into the lungs daily. 1 Inhaler 3  . ibuprofen (ADVIL,MOTRIN) 100 MG tablet Take 100 mg by mouth every 6 (six) hours as needed for fever.    . VENTOLIN HFA 108 (90 BASE) MCG/ACT inhaler Inhale 2 puffs by mouth  into lungs every 6 hours as needed for wheezing 72 g 1   No current facility-administered medications on file prior to visit.    BP 118/76 mmHg  Pulse 70  Temp(Src) 97.6 F (36.4 C) (Oral)  Ht 5' 3.25" (1.607 m)  Wt 189 lb 6.4 oz (85.911 kg)  BMI 33.27 kg/m2  SpO2 93%    Objective:   Physical Exam  Constitutional: She is oriented to person, place, and time.  Appears  well, no distress.  HENT:  Right Ear: External ear normal.  Left Ear: External ear normal.  Nose: Nose normal.  Mouth/Throat: Oropharynx is clear and moist.  Hoarseness present to voice, able to understand patient without difficulty.   Eyes: Conjunctivae are normal. Pupils are equal, round, and reactive to light.  Neck: Neck supple. No thyromegaly present.  Cardiovascular: Normal rate and regular rhythm.   Pulmonary/Chest: Effort normal and breath sounds normal.  Lymphadenopathy:    She has no cervical adenopathy.  Neurological: She is alert and oriented to person, place, and time.  Skin: Skin is warm and dry.  Psychiatric: She has a normal mood and affect.          Assessment & Plan:  Hoarseness:  Likely due to recent URI, but could be GERD. She has no systemic symptoms of URI and is a non  smoker. Education provided that hoarseness may last up to three weeks post URI and to notify either myself or PCP if still present in one week. May need to consider GERD treatment if that's still the case.  10 minutes spent with patient today.

## 2014-11-30 DIAGNOSIS — J039 Acute tonsillitis, unspecified: Secondary | ICD-10-CM | POA: Diagnosis not present

## 2015-04-06 ENCOUNTER — Ambulatory Visit (INDEPENDENT_AMBULATORY_CARE_PROVIDER_SITE_OTHER): Payer: Medicare Other | Admitting: Internal Medicine

## 2015-04-06 ENCOUNTER — Encounter: Payer: Self-pay | Admitting: Internal Medicine

## 2015-04-06 VITALS — BP 128/80 | HR 58 | Temp 97.5°F | Ht 63.0 in | Wt 191.0 lb

## 2015-04-06 DIAGNOSIS — J301 Allergic rhinitis due to pollen: Secondary | ICD-10-CM | POA: Diagnosis not present

## 2015-04-06 DIAGNOSIS — M150 Primary generalized (osteo)arthritis: Secondary | ICD-10-CM | POA: Diagnosis not present

## 2015-04-06 DIAGNOSIS — Z23 Encounter for immunization: Secondary | ICD-10-CM | POA: Diagnosis not present

## 2015-04-06 DIAGNOSIS — Z0000 Encounter for general adult medical examination without abnormal findings: Secondary | ICD-10-CM

## 2015-04-06 DIAGNOSIS — E785 Hyperlipidemia, unspecified: Secondary | ICD-10-CM

## 2015-04-06 DIAGNOSIS — Z7189 Other specified counseling: Secondary | ICD-10-CM

## 2015-04-06 DIAGNOSIS — J4530 Mild persistent asthma, uncomplicated: Secondary | ICD-10-CM

## 2015-04-06 DIAGNOSIS — M159 Polyosteoarthritis, unspecified: Secondary | ICD-10-CM

## 2015-04-06 MED ORDER — FLUTICASONE PROPIONATE HFA 44 MCG/ACT IN AERO: 2.0000 | INHALATION_SPRAY | Freq: Every day | RESPIRATORY_TRACT | Status: DC

## 2015-04-06 MED ORDER — ALBUTEROL SULFATE HFA 108 (90 BASE) MCG/ACT IN AERS: 2.0000 | INHALATION_SPRAY | Freq: Four times a day (QID) | RESPIRATORY_TRACT | Status: DC | PRN

## 2015-04-06 NOTE — Patient Instructions (Addendum)
Please set up your screening mammogram.   DASH Eating Plan DASH stands for "Dietary Approaches to Stop Hypertension." The DASH eating plan is a healthy eating plan that has been shown to reduce high blood pressure (hypertension). Additional health benefits may include reducing the risk of type 2 diabetes mellitus, heart disease, and stroke. The DASH eating plan may also help with weight loss. WHAT DO I NEED TO KNOW ABOUT THE DASH EATING PLAN? For the DASH eating plan, you will follow these general guidelines:  Choose foods with a percent daily value for sodium of less than 5% (as listed on the food label).  Use salt-free seasonings or herbs instead of table salt or sea salt.  Check with your health care provider or pharmacist before using salt substitutes.  Eat lower-sodium products, often labeled as "lower sodium" or "no salt added."  Eat fresh foods.  Eat more vegetables, fruits, and low-fat dairy products.  Choose whole grains. Look for the word "whole" as the first word in the ingredient list.  Choose fish and skinless chicken or [place] more often than red meat. Limit fish, poultry, and meat to 6 oz (170 g) each day.  Limit sweets, desserts, sugars, and sugary drinks.  Choose heart-healthy fats.  Limit cheese to 1 oz (28 g) per day.  Eat more home-cooked food and less restaurant, buffet, and fast food.  Limit fried foods.  Cook foods using methods other than frying.  Limit canned vegetables. If you do use them, rinse them well to decrease the sodium.  When eating at a restaurant, ask that your food be prepared with less salt, or no salt if possible. WHAT FOODS CAN I EAT? Seek help from a dietitian for individual calorie needs. Grains Whole grain or whole wheat bread. Brown rice. Whole grain or whole wheat pasta. Quinoa, bulgur, and whole grain cereals. Low-sodium cereals. Corn or whole wheat flour tortillas. Whole grain cornbread. Whole grain crackers. Low-sodium  crackers. Vegetables Fresh or frozen vegetables (raw, steamed, roasted, or grilled). Low-sodium or reduced-sodium tomato and vegetable juices. Low-sodium or reduced-sodium tomato sauce and paste. Low-sodium or reduced-sodium canned vegetables.  Fruits All fresh, canned (in natural juice), or frozen fruits. Meat and Other Protein Products Ground beef (85% or leaner), grass-fed beef, or beef trimmed of fat. Skinless chicken or [place]. Ground chicken or [place]. Pork trimmed of fat. All fish and seafood. Eggs. Dried beans, peas, or lentils. Unsalted nuts and seeds. Unsalted canned beans. Dairy Low-fat dairy products, such as skim or 1% milk, 2% or reduced-fat cheeses, low-fat ricotta or cottage cheese, or plain low-fat yogurt. Low-sodium or reduced-sodium cheeses. Fats and Oils Tub margarines without trans fats. Light or reduced-fat mayonnaise and salad dressings (reduced sodium). Avocado. Safflower, olive, or canola oils. Natural peanut or almond butter. Other Unsalted popcorn and pretzels. The items listed above may not be a complete list of recommended foods or beverages. Contact your dietitian for more options. WHAT FOODS ARE NOT RECOMMENDED? Grains White bread. White pasta. White rice. Refined cornbread. Bagels and croissants. Crackers that contain trans fat. Vegetables Creamed or fried vegetables. Vegetables in a cheese sauce. Regular canned vegetables. Regular canned tomato sauce and paste. Regular tomato and vegetable juices. Fruits Dried fruits. Canned fruit in light or heavy syrup. Fruit juice. Meat and Other Protein Products Fatty cuts of meat. Ribs, chicken wings, bacon, sausage, bologna, salami, chitterlings, fatback, hot dogs, bratwurst, and packaged luncheon meats. Salted nuts and seeds. Canned beans with salt. Dairy Whole or 2% milk, cream, half-and-half,  and cream cheese. Whole-fat or sweetened yogurt. Full-fat cheeses or blue cheese. Nondairy creamers and whipped toppings.  Processed cheese, cheese spreads, or cheese curds. Condiments Onion and garlic salt, seasoned salt, table salt, and sea salt. Canned and packaged gravies. Worcestershire sauce. Tartar sauce. Barbecue sauce. Teriyaki sauce. Soy sauce, including reduced sodium. Steak sauce. Fish sauce. Oyster sauce. Cocktail sauce. Horseradish. Ketchup and mustard. Meat flavorings and tenderizers. Bouillon cubes. Hot sauce. Tabasco sauce. Marinades. Taco seasonings. Relishes. Fats and Oils Butter, stick margarine, lard, shortening, ghee, and bacon fat. Coconut, palm kernel, or palm oils. Regular salad dressings. Other Pickles and olives. Salted popcorn and pretzels. The items listed above may not be a complete list of foods and beverages to avoid. Contact your dietitian for more information. WHERE CAN I FIND MORE INFORMATION? National Heart, Lung, and Blood Institute: travelstabloid.com Document Released: 07/14/2011 Document Revised: 12/09/2013 Document Reviewed: 05/29/2013 [facility] Patient Information 2015 [place], [place]. This information is not intended to replace advice given to you by your health care provider. Make sure you discuss any questions you have with your health care provider. Exercise to Lose Weight Exercise and a healthy diet may help you lose weight. Your doctor may suggest specific exercises. EXERCISE IDEAS AND TIPS  Choose low-cost things you enjoy doing, such as walking, bicycling, or exercising to workout videos.  Take stairs instead of the elevator.  Walk during your lunch break.  Park your car further away from work or school.  Go to a gym or an exercise class.  Start with 5 to 10 minutes of exercise each day. Build up to 30 minutes of exercise 4 to 6 days a week.  Wear shoes with good support and comfortable clothes.  Stretch before and after working out.  Work out until you breathe harder and your heart beats faster.  Drink extra water when  you exercise.  Do not do so much that you hurt yourself, feel dizzy, or get very short of breath. Exercises that burn about 150 calories:  Running 1  miles in 15 minutes.  Playing volleyball for 45 to 60 minutes.  Washing and waxing a car for 45 to 60 minutes.  Playing touch football for 45 minutes.  Walking 1  miles in 35 minutes.  Pushing a stroller 1  miles in 30 minutes.  Playing basketball for 30 minutes.  Raking leaves for 30 minutes.  Bicycling 5 miles in 30 minutes.  Walking 2 miles in 30 minutes.  Dancing for 30 minutes.  Shoveling snow for 15 minutes.  Swimming laps for 20 minutes.  Walking up stairs for 15 minutes.  Bicycling 4 miles in 15 minutes.  Gardening for 30 to 45 minutes.  Jumping rope for 15 minutes.  Washing windows or floors for 45 to 60 minutes. Document Released: 08/27/2010 Document Revised: 10/17/2011 Document Reviewed: 08/27/2010 [facility] Patient Information 2015 [place], [place]. This information is not intended to replace advice given to you by your health care provider. Make sure you discuss any questions you have with your health care provider.

## 2015-04-06 NOTE — Assessment & Plan Note (Signed)
More consistent with flovent Doesn't need albuterol much

## 2015-04-06 NOTE — Assessment & Plan Note (Signed)
Still prefers no statin 

## 2015-04-06 NOTE — Progress Notes (Signed)
Pre visit review using our clinic review tool, if applicable. No additional management support is needed unless otherwise documented below in the visit note. 

## 2015-04-06 NOTE — Assessment & Plan Note (Signed)
Uses cetirizine

## 2015-04-06 NOTE — Assessment & Plan Note (Signed)
Mild  Just uses advil at night

## 2015-04-06 NOTE — Addendum Note (Signed)
Addended by: Despina Hidden on: 04/06/2015 12:31 PM   Modules accepted: Orders

## 2015-04-06 NOTE — Assessment & Plan Note (Signed)
See social history 

## 2015-04-06 NOTE — Assessment & Plan Note (Signed)
I have personally reviewed the Medicare Annual Wellness questionnaire and have noted 1. The patient's medical and social history 2. Their use of alcohol, tobacco or illicit drugs 3. Their current medications and supplements 4. The patient's functional ability including ADL's, fall risks, home safety risks and hearing or visual             impairment. 5. Diet and physical activities 6. Evidence for depression or mood disorders  The patients weight, height, BMI and visual acuity have been recorded in the chart I have made referrals, counseling and provided education to the patient based review of the above and I have provided the pt with a written personalized care plan for preventive services.  I have provided you with a copy of your personalized plan for preventive services. Please take the time to review along with your updated medication list.  Due for mammogram Colonoscopy due 2018 (polyp in 2013) Will take prevnar after my urging Still wants to hold off on flu vaccine Discussed fitness, etc

## 2015-04-06 NOTE — Progress Notes (Signed)
Subjective:    Patient ID: Natalie [name], female    DOB: 02/03/1951, 64 y.o.   MRN: [id number]  HPI Here for Medicare wellness and follow up on chronic medical conditions Reviewed form and advanced directives Reviewed other doctors--just optometrist Vision and hearing are okay No alcohol or tobacco No exercise--- discussed No falls  No depression or anhedonia Independent with instrumental ADLs No memory problems  After last visit--throat got worse Wound up at urgent care a few weeks later-- diagnosed with strep Better now  Wheezes with walking-- but doesn't exercise Uses steps at work a little No regular cough Still doesn't use the flovent daily--but more than in the past (maybe 3 days per week) Uses albuterol once a week or so  Some allergy issues with pollen Uses zyrtec regularly  No recent acid problems (if she avoids spicy foods) No swallowing problems Doesn't use antacids  No major issues with arthritis Uses advil mostly when it rains-- and takes 2 at night  Still prefers no medication for cholesterol  Current Outpatient Prescriptions on File Prior to Visit  Medication Sig Dispense Refill  . cetirizine (ZYRTEC) 10 MG tablet Take 10 mg by mouth daily.      . fluticasone (FLOVENT HFA) 44 MCG/ACT inhaler Inhale 2 puffs into the lungs daily. 1 Inhaler 3  . ibuprofen (ADVIL,MOTRIN) 100 MG tablet Take 100 mg by mouth every 6 (six) hours as needed for fever.    . VENTOLIN HFA 108 (90 BASE) MCG/ACT inhaler Inhale 2 puffs by mouth  into lungs every 6 hours as needed for wheezing 72 g 1   No current facility-administered medications on file prior to visit.    No Known Allergies  Past Medical History  Diagnosis Date  . Allergy   . Asthma   . Hyperlipidemia   . GERD (gastroesophageal reflux disease)   . Heart murmur     was told years ago  . Cataract   . History of shingles 2006    Past Surgical History  Procedure Laterality Date  . Abdominal  hysterectomy    . Vaginal delivery      x2    Family History  Problem Relation Age of Onset  . Arthritis Mother   . Diabetes Mother   . Hypertension Mother   . Heart disease Neg Hx   . Colon cancer Father 35    Social History   Social History  . Marital Status: Single    Spouse Name: N/A  . Number of Children: 2  . 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 Smoking status: Never Smoker   . Smokeless tobacco: Never Used  . Alcohol Use: No  . Drug Use: No  . Sexual Activity: Not on file   Other Topics Concern  . Not on file   Social History Narrative   No living will   Requests daughter Natalie [name] as health care POA   Not sure about resuscitation   Not sure about tube feeds   Review of Systems In a relationship--discussed safe sex Wears seat belt Recently needed partial fixed --on top. No chest pain or palpitations No dizziness Appetite is fine--weight up a few pounds Bowels are fine No dysuria or hematuria    Objective:   Physical Exam  Constitutional: She is oriented to person, place, and time. She appears well-developed. No distress.  HENT:  Mouth/Throat: Oropharynx is clear  and moist. No oropharyngeal exudate.  Neck: Normal range of motion. Neck supple. No thyromegaly present.  Cardiovascular: Normal rate, regular rhythm and intact distal pulses.  Exam reveals no gallop.   No murmur heard. Pulmonary/Chest: Effort normal and breath sounds normal. No respiratory distress. She has no wheezes. She has no rales.  Abdominal: Soft. There is no tenderness.  Musculoskeletal: She exhibits no edema or tenderness.  Lymphadenopathy:    She has no cervical adenopathy.  Neurological: She is alert and oriented to person, place, and time.  President--- "Elyn Peers, Amorita... (then) Bush" [id number] Recall 3/3  Skin: No rash noted. No erythema.  Psychiatric:  She has a normal mood and affect. Her behavior is normal.          Assessment & Plan:

## 2015-04-07 ENCOUNTER — Encounter: Payer: Self-pay | Admitting: *Deleted

## 2015-04-07 ENCOUNTER — Other Ambulatory Visit: Payer: Self-pay | Admitting: Internal Medicine

## 2015-04-07 DIAGNOSIS — Z1231 Encounter for screening mammogram for malignant neoplasm of breast: Secondary | ICD-10-CM

## 2015-04-07 LAB — CBC WITH DIFFERENTIAL/PLATELET
Basophils Absolute: 0 10*3/uL (ref 0.0–0.2)
Basos: 1 %
EOS (ABSOLUTE): 0.1 10*3/uL (ref 0.0–0.4)
Eos: 3 %
HEMATOCRIT: 41.8 % (ref 34.0–46.6)
Hemoglobin: 14 g/dL (ref 11.1–15.9)
Immature Grans (Abs): 0 10*3/uL (ref 0.0–0.1)
Immature Granulocytes: 0 %
Lymphocytes Absolute: 2.7 10*3/uL (ref 0.7–3.1)
Lymphs: 63 %
MCH: 29.4 pg (ref 26.6–33.0)
MCHC: 33.5 g/dL (ref 31.5–35.7)
MCV: 88 fL (ref 79–97)
MONOCYTES: 8 %
Monocytes Absolute: 0.3 10*3/uL (ref 0.1–0.9)
NEUTROS ABS: 1 10*3/uL — AB (ref 1.4–7.0)
Neutrophils: 25 %
Platelets: 271 10*3/uL (ref 150–379)
RBC: 4.76 x10E6/uL (ref 3.77–5.28)
RDW: 14.3 % (ref 12.3–15.4)
WBC: 4.2 10*3/uL (ref 3.4–10.8)

## 2015-04-07 LAB — LIPID PANEL
CHOL/HDL RATIO: 5 ratio — AB (ref 0.0–4.4)
Cholesterol, Total: 262 mg/dL — ABNORMAL HIGH (ref 100–199)
HDL: 52 mg/dL (ref >39)
LDL CALC: 179 mg/dL — AB (ref 0–99)
Triglycerides: 154 mg/dL — ABNORMAL HIGH (ref 0–149)
VLDL Cholesterol Cal: 31 mg/dL (ref 5–40)

## 2015-04-07 LAB — COMPREHENSIVE METABOLIC PANEL
A/G RATIO: 1.3 (ref 1.1–2.5)
ALT: 12 IU/L (ref 0–32)
AST: 16 IU/L (ref 0–40)
Albumin: 4 g/dL (ref 3.6–4.8)
Alkaline Phosphatase: 60 IU/L (ref 39–117)
BUN/Creatinine Ratio: 10 — ABNORMAL LOW (ref 11–26)
BUN: 8 mg/dL (ref 8–27)
Bilirubin Total: 0.6 mg/dL (ref 0.0–1.2)
CO2: 26 mmol/L (ref 18–29)
Calcium: 9.1 mg/dL (ref 8.7–10.3)
Chloride: 103 mmol/L (ref 97–108)
Creatinine, Ser: 0.81 mg/dL (ref 0.57–1.00)
GFR calc Af Amer: 88 mL/min/{1.73_m2} (ref >59)
GFR calc non Af Amer: 76 mL/min/{1.73_m2} (ref >59)
Globulin, Total: 3 g/dL (ref 1.5–4.5)
Glucose: 95 mg/dL (ref 65–99)
POTASSIUM: 4.2 mmol/L (ref 3.5–5.2)
Sodium: 144 mmol/L (ref 134–144)
TOTAL PROTEIN: 7 g/dL (ref 6.0–8.5)

## 2015-04-09 ENCOUNTER — Ambulatory Visit
Admission: RE | Admit: 2015-04-09 | Discharge: 2015-04-09 | Disposition: A | Discharge status: Home / Self Care | Payer: Medicare Other | Source: Ambulatory Visit | Attending: Internal Medicine | Admitting: Internal Medicine

## 2015-04-09 ENCOUNTER — Other Ambulatory Visit: Payer: Self-pay | Admitting: Internal Medicine

## 2015-04-09 DIAGNOSIS — Z1231 Encounter for screening mammogram for malignant neoplasm of breast: Secondary | ICD-10-CM | POA: Insufficient documentation

## 2015-04-14 ENCOUNTER — Encounter: Payer: Self-pay | Admitting: *Deleted

## 2015-07-22 ENCOUNTER — Telehealth: Payer: Self-pay

## 2015-07-22 NOTE — Telephone Encounter (Signed)
Pt left v/m; pts ins will no longer cover ventolin inhaler and pt request proair [facility] fax [phone]. Pt request cb when done. Last annual 04/06/15.

## 2015-07-23 MED ORDER — ALBUTEROL SULFATE HFA 108 (90 BASE) MCG/ACT IN AERS: 2.0000 | INHALATION_SPRAY | Freq: Four times a day (QID) | RESPIRATORY_TRACT | Status: DC | PRN

## 2015-07-23 NOTE — Telephone Encounter (Signed)
Let her know I sent the new Rx

## 2015-07-27 ENCOUNTER — Telehealth: Payer: Self-pay | Admitting: Internal Medicine

## 2015-07-27 MED ORDER — ALBUTEROL SULFATE HFA 108 (90 BASE) MCG/ACT IN AERS: 2.0000 | INHALATION_SPRAY | Freq: Four times a day (QID) | RESPIRATORY_TRACT | Status: DC | PRN

## 2015-07-27 NOTE — Telephone Encounter (Signed)
rx sent to pharmacy by e-script  

## 2015-07-27 NOTE — Telephone Encounter (Signed)
Pt called stating her insurance will not pay for ventlyn any more She stated that the rx needs to be changed to proair This needs to be [facility]  [facility]

## 2015-07-31 ENCOUNTER — Ambulatory Visit (INDEPENDENT_AMBULATORY_CARE_PROVIDER_SITE_OTHER): Payer: Medicare Other | Admitting: Internal Medicine

## 2015-07-31 ENCOUNTER — Encounter: Payer: Self-pay | Admitting: Internal Medicine

## 2015-07-31 DIAGNOSIS — S8001XA Contusion of right knee, initial encounter: Secondary | ICD-10-CM | POA: Insufficient documentation
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
## 2015-07-31 NOTE — Patient Instructions (Signed)
You may continue ibuprofen 3 tabs three times daily till the pain is gone.

## 2015-07-31 NOTE — Progress Notes (Signed)
Pre visit review using our clinic review tool, if applicable. No additional management support is needed unless otherwise documented below in the visit note. 

## 2015-07-31 NOTE — Assessment & Plan Note (Signed)
No evidence of major injury Discussed ice and ibuprofen

## 2015-07-31 NOTE — Assessment & Plan Note (Signed)
Fortunately fairly minor

## 2015-09-05 ENCOUNTER — Other Ambulatory Visit: Payer: Self-pay | Admitting: Internal Medicine

## 2016-01-10 DIAGNOSIS — J069 Acute upper respiratory infection, unspecified: Secondary | ICD-10-CM | POA: Diagnosis not present

## 2016-01-10 DIAGNOSIS — B9789 Other viral agents as the cause of diseases classified elsewhere: Secondary | ICD-10-CM | POA: Diagnosis not present

## 2016-04-06 ENCOUNTER — Encounter: Payer: Self-pay | Admitting: Internal Medicine

## 2016-04-06 ENCOUNTER — Ambulatory Visit (INDEPENDENT_AMBULATORY_CARE_PROVIDER_SITE_OTHER): Payer: Medicare Other | Admitting: Internal Medicine

## 2016-04-06 VITALS — BP 118/72 | HR 108 | Temp 98.9°F | Ht 63.0 in | Wt 193.5 lb

## 2016-04-06 DIAGNOSIS — Z0000 Encounter for general adult medical examination without abnormal findings: Secondary | ICD-10-CM

## 2016-04-06 DIAGNOSIS — J4520 Mild intermittent asthma, uncomplicated: Secondary | ICD-10-CM | POA: Diagnosis not present

## 2016-04-06 DIAGNOSIS — E785 Hyperlipidemia, unspecified: Secondary | ICD-10-CM

## 2016-04-06 DIAGNOSIS — J209 Acute bronchitis, unspecified: Secondary | ICD-10-CM | POA: Diagnosis not present

## 2016-04-06 DIAGNOSIS — M150 Primary generalized (osteo)arthritis: Secondary | ICD-10-CM

## 2016-04-06 DIAGNOSIS — Z7189 Other specified counseling: Secondary | ICD-10-CM

## 2016-04-06 DIAGNOSIS — M159 Polyosteoarthritis, unspecified: Secondary | ICD-10-CM

## 2016-04-06 MED ORDER — DOXYCYCLINE HYCLATE 100 MG PO TABS: 100.0000 mg | ORAL_TABLET | Freq: Two times a day (BID) | ORAL | 0 refills | Status: DC

## 2016-04-06 MED ORDER — FLUTICASONE PROPIONATE HFA 44 MCG/ACT IN AERO: INHALATION_SPRAY | RESPIRATORY_TRACT | 11 refills | Status: DC

## 2016-04-06 MED ORDER — ALBUTEROL SULFATE HFA 108 (90 BASE) MCG/ACT IN AERS: 2.0000 | INHALATION_SPRAY | Freq: Four times a day (QID) | RESPIRATORY_TRACT | 5 refills | Status: DC | PRN

## 2016-04-06 NOTE — Assessment & Plan Note (Signed)
Still prefers no statin 

## 2016-04-06 NOTE — Assessment & Plan Note (Signed)
See social history Blank forms given 

## 2016-04-06 NOTE — Assessment & Plan Note (Signed)
Uses albuterol 2-3 times per week Intermittent with flovent

## 2016-04-06 NOTE — Assessment & Plan Note (Signed)
I have personally reviewed the Medicare Annual Wellness questionnaire and have noted 1. The patient's medical and social history 2. Their use of alcohol, tobacco or illicit drugs 3. Their current medications and supplements 4. The patient's functional ability including ADL's, fall risks, home safety risks and hearing or visual             impairment. 5. Diet and physical activities 6. Evidence for depression or mood disorders  The patients weight, height, BMI and visual acuity have been recorded in the chart I have made referrals, counseling and provided education to the patient based review of the above and I have provided the pt with a written personalized care plan for preventive services.  I have provided you with a copy of your personalized plan for preventive services. Please take the time to review along with your updated medication list.  Gets yearly mammogram Colon due 2018 No pap Will take prevnar---with flu vaccine when feels better Discussed exercise and healthy eating

## 2016-04-06 NOTE — Assessment & Plan Note (Signed)
Does okay with low dose ibuprofen

## 2016-04-06 NOTE — Patient Instructions (Addendum)
Please set up an appointment to get the prevnar and flu vaccines in about 2 weeks. Please start the antibiotic if you are worsening in the next few days.  DASH Eating Plan DASH stands for "Dietary Approaches to Stop Hypertension." The DASH eating plan is a healthy eating plan that has been shown to reduce high blood pressure (hypertension). Additional health benefits may include reducing the risk of type 2 diabetes mellitus, heart disease, and stroke. The DASH eating plan may also help with weight loss. WHAT DO I NEED TO KNOW ABOUT THE DASH EATING PLAN? For the DASH eating plan, you will follow these general guidelines:  Choose foods with a percent daily value for sodium of less than 5% (as listed on the food label).  Use salt-free seasonings or herbs instead of table salt or sea salt.  Check with your health care provider or pharmacist before using salt substitutes.  Eat lower-sodium products, often labeled as "lower sodium" or "no salt added."  Eat fresh foods.  Eat more vegetables, fruits, and low-fat dairy products.  Choose whole grains. Look for the word "whole" as the first word in the ingredient list.  Choose fish and skinless chicken or [place] more often than red meat. Limit fish, poultry, and meat to 6 oz (170 g) each day.  Limit sweets, desserts, sugars, and sugary drinks.  Choose heart-healthy fats.  Limit cheese to 1 oz (28 g) per day.  Eat more home-cooked food and less restaurant, buffet, and fast food.  Limit fried foods.  Cook foods using methods other than frying.  Limit canned vegetables. If you do use them, rinse them well to decrease the sodium.  When eating at a restaurant, ask that your food be prepared with less salt, or no salt if possible. WHAT FOODS CAN I EAT? Seek help from a dietitian for individual calorie needs. Grains Whole grain or whole wheat bread. Brown rice. Whole grain or whole wheat pasta. Quinoa, bulgur, and whole grain cereals.  Low-sodium cereals. Corn or whole wheat flour tortillas. Whole grain cornbread. Whole grain crackers. Low-sodium crackers. Vegetables Fresh or frozen vegetables (raw, steamed, roasted, or grilled). Low-sodium or reduced-sodium tomato and vegetable juices. Low-sodium or reduced-sodium tomato sauce and paste. Low-sodium or reduced-sodium canned vegetables.  Fruits All fresh, canned (in natural juice), or frozen fruits. Meat and Other Protein Products Ground beef (85% or leaner), grass-fed beef, or beef trimmed of fat. Skinless chicken or [place]. Ground chicken or [place]. Pork trimmed of fat. All fish and seafood. Eggs. Dried beans, peas, or lentils. Unsalted nuts and seeds. Unsalted canned beans. Dairy Low-fat dairy products, such as skim or 1% milk, 2% or reduced-fat cheeses, low-fat ricotta or cottage cheese, or plain low-fat yogurt. Low-sodium or reduced-sodium cheeses. Fats and Oils Tub margarines without trans fats. Light or reduced-fat mayonnaise and salad dressings (reduced sodium). Avocado. Safflower, olive, or canola oils. Natural peanut or almond butter. Other Unsalted popcorn and pretzels. The items listed above may not be a complete list of recommended foods or beverages. Contact your dietitian for more options. WHAT FOODS ARE NOT RECOMMENDED? Grains White bread. White pasta. White rice. Refined cornbread. Bagels and croissants. Crackers that contain trans fat. Vegetables Creamed or fried vegetables. Vegetables in a cheese sauce. Regular canned vegetables. Regular canned tomato sauce and paste. Regular tomato and vegetable juices. Fruits Dried fruits. Canned fruit in light or heavy syrup. Fruit juice. Meat and Other Protein Products Fatty cuts of meat. Ribs, chicken wings, bacon, sausage, bologna, salami, chitterlings, fatback,  hot dogs, bratwurst, and packaged luncheon meats. Salted nuts and seeds. Canned beans with salt. Dairy Whole or 2% milk, cream, half-and-half, and cream  cheese. Whole-fat or sweetened yogurt. Full-fat cheeses or blue cheese. Nondairy creamers and whipped toppings. Processed cheese, cheese spreads, or cheese curds. Condiments Onion and garlic salt, seasoned salt, table salt, and sea salt. Canned and packaged gravies. Worcestershire sauce. Tartar sauce. Barbecue sauce. Teriyaki sauce. Soy sauce, including reduced sodium. Steak sauce. Fish sauce. Oyster sauce. Cocktail sauce. Horseradish. Ketchup and mustard. Meat flavorings and tenderizers. Bouillon cubes. Hot sauce. Tabasco sauce. Marinades. Taco seasonings. Relishes. Fats and Oils Butter, stick margarine, lard, shortening, ghee, and bacon fat. Coconut, palm kernel, or palm oils. Regular salad dressings. Other Pickles and olives. Salted popcorn and pretzels. The items listed above may not be a complete list of foods and beverages to avoid. Contact your dietitian for more information. WHERE CAN I FIND MORE INFORMATION? National Heart, Lung, and Blood Institute: travelstabloid.com   This information is not intended to replace advice given to you by your health care provider. Make sure you discuss any questions you have with your health care provider.   Document Released: 07/14/2011 Document Revised: 08/15/2014 Document Reviewed: 05/29/2013 Elsevier Interactive Patient Education [employer].

## 2016-04-06 NOTE — Progress Notes (Signed)
Subjective:    Patient ID: Natalie [name], female    DOB: Dec 17, 1950, 65 y.o.   MRN: [id number]  HPI Here for Medicare wellness and follow up of chronic medical conditions Reviewed form and advanced directives Reviewed other doctors No tobacco or alcohol Vision and hearing are okay Still doesn't exercise No falls No depression or anhedonia Independent with instrumental ADLs No apparent memory problems Still works full time  Not doing so great--headache and chest congestion Started 3-4 days ago Seemed worse yesterday--had to leave work early No SOB--just some cough Asthma does act up with infection Some brown sputum--since last night Throat seemed "shut" Not using the flovent regularly--just sporadic Uses the albuterol 2-3 times per week Still gets DOE --like running up stairs. No change  Some joint pains advil helps-- needs daily (400mg  bid)  Discussed cholesterol She still doesn't want meds for this  No recent heartburn or swallowing problems  Current Outpatient Prescriptions on File Prior to Visit  Medication Sig Dispense Refill  . albuterol (PROAIR HFA) 108 (90 BASE) MCG/ACT inhaler Inhale 2 puffs into the lungs every 6 (six) hours as needed for wheezing or shortness of breath. 3 Inhaler 1  . cetirizine (ZYRTEC) 10 MG tablet Take 10 mg by mouth daily.      Marland Kitchen FLOVENT HFA 44 MCG/ACT inhaler Use 2 puffs daily 21.2 g 3   No current facility-administered medications on file prior to visit.     No Known Allergies  Past Medical History:  Diagnosis Date  . Allergy   . Asthma   . Cataract   . GERD (gastroesophageal reflux disease)   . Heart murmur    was told years ago  . History of shingles 2006  . Hyperlipidemia     Past Surgical History:  Procedure Laterality Date  . ABDOMINAL HYSTERECTOMY    . VAGINAL DELIVERY     x2    Family History  Problem Relation Age of Onset  . Arthritis Mother   . Diabetes Mother   . Hypertension Mother   . Colon  cancer Father 44  . Breast cancer Daughter 73  . Heart disease Neg Hx     Social History   Social History  . Marital status: Single    Spouse name: N/A  . Number of children: 2  . Years of education: N/A   Occupational History  . 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 Smoking status: Never Smoker  . Smokeless tobacco: Never Used  . Alcohol use No  . Drug use: No  . Sexual activity: Not on file   Other Topics Concern  . Not on file   Social History Narrative   No living will   Requests daughter Natalie [name] as health care POA   Not sure about resuscitation   Not sure about tube feeds   Review of Systems Appetite is fine Weight is stable Sleeps well in general Wears seat belt Teeth okay--keeps up with dentist [name]) Bowels are okay--no blood Voids okay--pad for stress incontinence (like during illness like this) No longer in relationship---not looking now No rash or suspicious lesions    Objective:   Physical Exam  Constitutional: She is oriented to person, place, and time. She appears well-developed. No distress.  HENT:  No sinus tenderness Mild nasal inflammation TMs normal Slight pharyngeal injection  Neck: Normal range of motion. Neck supple. No thyromegaly present.  Cardiovascular: Normal rate, regular rhythm, normal heart sounds and  intact distal pulses.  Exam reveals no gallop.   No murmur heard. Pulmonary/Chest: Effort normal and breath sounds normal. No respiratory distress. She has no wheezes. She has no rales.  Abdominal: Soft. There is no tenderness.  Musculoskeletal: She exhibits no edema or tenderness.  Lymphadenopathy:    She has no cervical adenopathy.  Neurological: She is alert and oriented to person, place, and time.  President-- "[name], Obama, Clinton---Bush" [phone] D-l-o-r-w Recall 2/3  Skin: No rash noted. No erythema.  Psychiatric: She has a normal mood and affect. Her  behavior is normal.          Assessment & Plan:

## 2016-04-06 NOTE — Assessment & Plan Note (Signed)
Probably viral Discussed supportive care If worsens, doxy

## 2016-04-07 LAB — CBC WITH DIFFERENTIAL/PLATELET
BASOS ABS: 0 10*3/uL (ref 0.0–0.2)
Basos: 0 %
EOS (ABSOLUTE): 0 10*3/uL (ref 0.0–0.4)
Eos: 0 %
HEMATOCRIT: 42 % (ref 34.0–46.6)
Hemoglobin: 14.3 g/dL (ref 11.1–15.9)
Immature Grans (Abs): 0 10*3/uL (ref 0.0–0.1)
Immature Granulocytes: 0 %
LYMPHS ABS: 1.1 10*3/uL (ref 0.7–3.1)
Lymphs: 18 %
MCH: 29.7 pg (ref 26.6–33.0)
MCHC: 34 g/dL (ref 31.5–35.7)
MCV: 87 fL (ref 79–97)
MONOS ABS: 0.6 10*3/uL (ref 0.1–0.9)
Monocytes: 11 %
Neutrophils Absolute: 4.2 10*3/uL (ref 1.4–7.0)
Neutrophils: 71 %
Platelets: 223 10*3/uL (ref 150–379)
RBC: 4.82 x10E6/uL (ref 3.77–5.28)
RDW: 14.1 % (ref 12.3–15.4)
WBC: 6 10*3/uL (ref 3.4–10.8)

## 2016-04-07 LAB — COMPREHENSIVE METABOLIC PANEL
ALBUMIN: 4.1 g/dL (ref 3.6–4.8)
ALK PHOS: 55 IU/L (ref 39–117)
ALT: 13 IU/L (ref 0–32)
AST: 24 IU/L (ref 0–40)
Albumin/Globulin Ratio: 1.2 (ref 1.2–2.2)
BUN / CREAT RATIO: 10 — AB (ref 12–28)
BUN: 8 mg/dL (ref 8–27)
CHLORIDE: 96 mmol/L (ref 96–106)
CO2: 24 mmol/L (ref 18–29)
CREATININE: 0.79 mg/dL (ref 0.57–1.00)
Calcium: 8.8 mg/dL (ref 8.7–10.3)
GFR calc Af Amer: 90 mL/min/{1.73_m2} (ref >59)
GFR calc non Af Amer: 78 mL/min/{1.73_m2} (ref >59)
GLUCOSE: 112 mg/dL — AB (ref 65–99)
Globulin, Total: 3.5 g/dL (ref 1.5–4.5)
Potassium: 3.8 mmol/L (ref 3.5–5.2)
SODIUM: 139 mmol/L (ref 134–144)
Total Protein: 7.6 g/dL (ref 6.0–8.5)

## 2016-04-07 LAB — LIPID PANEL
CHOL/HDL RATIO: 4.1 ratio (ref 0.0–4.4)
CHOLESTEROL TOTAL: 202 mg/dL — AB (ref 100–199)
HDL: 49 mg/dL (ref >39)
LDL Calculated: 133 mg/dL — ABNORMAL HIGH (ref 0–99)
TRIGLYCERIDES: 99 mg/dL (ref 0–149)
VLDL Cholesterol Cal: 20 mg/dL (ref 5–40)

## 2016-04-20 ENCOUNTER — Ambulatory Visit: Payer: Self-pay

## 2016-04-26 ENCOUNTER — Ambulatory Visit
Admission: RE | Admit: 2016-04-26 | Discharge: 2016-04-26 | Disposition: A | Discharge status: Home / Self Care | Payer: Medicare Other | Source: Ambulatory Visit | Attending: Internal Medicine | Admitting: Internal Medicine

## 2016-04-26 DIAGNOSIS — Z1231 Encounter for screening mammogram for malignant neoplasm of breast: Secondary | ICD-10-CM | POA: Insufficient documentation

## 2016-07-08 ENCOUNTER — Telehealth: Payer: Self-pay | Admitting: Internal Medicine

## 2016-07-08 ENCOUNTER — Ambulatory Visit (INDEPENDENT_AMBULATORY_CARE_PROVIDER_SITE_OTHER): Payer: Medicare Other | Admitting: Internal Medicine

## 2016-07-08 ENCOUNTER — Encounter: Payer: Self-pay | Admitting: Internal Medicine

## 2016-07-08 DIAGNOSIS — L989 Disorder of the skin and subcutaneous tissue, unspecified: Secondary | ICD-10-CM | POA: Insufficient documentation

## 2016-07-08 DIAGNOSIS — Z202 Contact with and (suspected) exposure to infections with a predominantly sexual mode of transmission: Secondary | ICD-10-CM | POA: Diagnosis not present

## 2016-07-08 DIAGNOSIS — M79604 Pain in right leg: Secondary | ICD-10-CM | POA: Diagnosis not present

## 2016-07-08 NOTE — Assessment & Plan Note (Signed)
Seems to be muscular in quad No action needed

## 2016-07-08 NOTE — Telephone Encounter (Signed)
Pt would like results from 12/1 labs mailed to her home.

## 2016-07-08 NOTE — Progress Notes (Signed)
   Subjective:    Patient ID: Natalie [name], female    DOB: [birth year], 65 y.o.   MRN: [id number]  HPI Here with several concerns  2 bumps on left bridge of nose for 8 days Not really painful No apparent bites Some headache (biilateral occiput) and some nasal congestion-- but no fever No change--other than slightly smaller  Swelling in right leg for 2 days or so ??hit it on something Pain along lateral quad 2 small scabbed areas below knee No calf pain--but some foot swelling  Same relationship --but now ended (October) No condoms May have been sleeping with other women No vaginal sores, discharge, etc Current Outpatient Prescriptions on File Prior to Visit  Medication Sig Dispense Refill  . albuterol (PROAIR HFA) 108 (90 Base) MCG/ACT inhaler Inhale 2 puffs into the lungs every 6 (six) hours as needed for wheezing or shortness of breath. 1 Inhaler 5  . cetirizine (ZYRTEC) 10 MG tablet Take 10 mg by mouth daily.      . fluticasone (FLOVENT HFA) 44 MCG/ACT inhaler Use 2 puffs daily 10.6 g 11  . ibuprofen (ADVIL,MOTRIN) 200 MG tablet Take 400 mg by mouth 2 (two) times daily.     No current facility-administered medications on file prior to visit.     No Known Allergies  Past Medical History:  Diagnosis Date  . Allergy   . Asthma   . Cataract   . GERD (gastroesophageal reflux disease)   . Heart murmur    was told years ago  . History of shingles 2006  . Hyperlipidemia     Past Surgical History:  Procedure Laterality Date  . ABDOMINAL HYSTERECTOMY    . VAGINAL DELIVERY     x2    Family History  Problem Relation Age of Onset  . Arthritis Mother   . Diabetes Mother   . Hypertension Mother   . Colon cancer Father 52  . Breast cancer Daughter 69  . Heart disease Neg Hx     Social History   Social History  . Marital status: Single    Spouse name: N/A  . Number of children: 2  . Years of education: N/A   Occupational History  . 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 Smoking status: Never Smoker  . Smokeless tobacco: Never Used  . Alcohol use No  . Drug use: No  . Sexual activity: Not on file   Other Topics Concern  . Not on file   Social History Narrative   No living will   Requests daughter Natalie [name] as health care POA   Not sure about resuscitation   Not sure about tube feeds    Review of Systems No chest pain No SOB No fever    Objective:   Physical Exam  Musculoskeletal:  No clear cut edema No calf tenderness Mild tenderness in lateral quad mostly up towards hip No signs of vein involvement  Skin:  2 small papules--no redness, not vesicular, not inflamed-- at top of right nose (medial to eye)          Assessment & Plan:

## 2016-07-08 NOTE — Progress Notes (Signed)
Pre visit review using our clinic review tool, if applicable. No additional management support is needed unless otherwise documented below in the visit note. 

## 2016-07-08 NOTE — Assessment & Plan Note (Signed)
Reassured that this doesn't look like shingles Observe only

## 2016-07-08 NOTE — Assessment & Plan Note (Addendum)
Last exposure 6+ weeks ago Will check labs Discussed condoms

## 2016-07-09 LAB — HIV ANTIBODY (ROUTINE TESTING W REFLEX): HIV 1&2 Ab, 4th Generation: NONREACTIVE

## 2016-07-09 LAB — GC/CHLAMYDIA PROBE AMP
CT PROBE, AMP APTIMA: NOT DETECTED
GC Probe RNA: NOT DETECTED

## 2016-07-09 LAB — RPR

## 2016-07-12 NOTE — Telephone Encounter (Signed)
Patient returned call about lab results. 

## 2016-12-15 DIAGNOSIS — M79645 Pain in left finger(s): Secondary | ICD-10-CM | POA: Diagnosis not present

## 2017-03-01 ENCOUNTER — Telehealth: Payer: Self-pay

## 2017-03-01 NOTE — Telephone Encounter (Addendum)
[name] returned Melanie's call.  [name] said she can't get her Mom to come in for an appointment.  [name] said she just wanted to make [facility] aware of what's going on, so Rollene Fare will be aware the next time patient comes in for an appointment. I cancelled the appointment with [facility] for tomorrow.

## 2017-03-01 NOTE — Telephone Encounter (Signed)
Have pt make an appt and we can try to see what is going on.
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
## 2017-03-01 NOTE — Telephone Encounter (Signed)
Called daughter Susa Loffler... I went ahead and scheduled for 10:45 30 min slot tomorrow just in case for now... Will try again later

## 2017-03-02 ENCOUNTER — Ambulatory Visit: Payer: Self-pay | Admitting: Internal Medicine

## 2017-03-02 NOTE — Telephone Encounter (Signed)
noted 

## 2017-03-14 ENCOUNTER — Telehealth: Payer: Self-pay | Admitting: Internal Medicine

## 2017-03-14 ENCOUNTER — Other Ambulatory Visit: Payer: Self-pay | Admitting: Internal Medicine

## 2017-03-14 NOTE — Telephone Encounter (Signed)
Patient needs a mammogram scheduled at [facility] after 04/26/17.  Patient wants the latest appointment.

## 2017-03-16 NOTE — Telephone Encounter (Signed)
Natalie [name] can you help pt with this.  She had trouble scheduling

## 2017-03-17 NOTE — Telephone Encounter (Signed)
I called [facility] to schedule screening mammo. I cannot sch since there are not orders. I called pt to let her know but no vm set up.

## 2017-03-20 ENCOUNTER — Other Ambulatory Visit: Payer: Self-pay | Admitting: Internal Medicine

## 2017-03-20 DIAGNOSIS — Z1231 Encounter for screening mammogram for malignant neoplasm of breast: Secondary | ICD-10-CM

## 2017-03-24 NOTE — Telephone Encounter (Signed)
I spoke with pt. She will call [facility] and set up.

## 2017-04-08 ENCOUNTER — Emergency Department: Payer: Medicare Other

## 2017-04-08 ENCOUNTER — Emergency Department
Admission: EM | Admit: 2017-04-08 | Discharge: 2017-04-08 | Disposition: A | Discharge status: Home / Self Care | Payer: Medicare Other | Attending: Emergency Medicine | Admitting: Emergency Medicine

## 2017-04-08 ENCOUNTER — Encounter: Payer: Self-pay | Admitting: Emergency Medicine

## 2017-04-08 DIAGNOSIS — M79605 Pain in left leg: Secondary | ICD-10-CM | POA: Diagnosis not present

## 2017-04-08 DIAGNOSIS — Z79899 Other long term (current) drug therapy: Secondary | ICD-10-CM | POA: Diagnosis not present

## 2017-04-08 DIAGNOSIS — J45909 Unspecified asthma, uncomplicated: Secondary | ICD-10-CM | POA: Insufficient documentation

## 2017-04-08 DIAGNOSIS — M7989 Other specified soft tissue disorders: Secondary | ICD-10-CM | POA: Diagnosis not present

## 2017-04-08 NOTE — ED Provider Notes (Signed)
[facility] Emergency Department Provider Note  Time seen: 11:53 AM  I have reviewed the triage vital signs and the nursing notes.   HISTORY  Chief Complaint Leg Pain    HPI Natalie [name] is a 66 y.o. female with a past medical history of gastric reflux, hyperlipidemia, presents to the emergency department for left leg discomfort. According to the patient for the past one week she has had intermittent leg discomfort especially behind the left knee. She states some swelling at times as well although no appreciable swelling currently. Denies any injury. Denies any fever. Denies any chest pain or trouble breathing. Scribe's discomfort is very mild currently located behind the left knee.  Past Medical History:  Diagnosis Date  . Allergy   . Asthma   . Cataract   . GERD (gastroesophageal reflux disease)   . Heart murmur    was told years ago  . History of shingles 2006  . Hyperlipidemia     Patient Active Problem List   Diagnosis Date Noted  . Skin lesion of face 07/08/2016  . Right leg pain 07/08/2016  . Exposure to sexually transmitted disease (STD) 07/08/2016  . Advanced directives, counseling/discussion 04/04/2014  . Osteoarthritis, multiple sites 04/04/2012  . Routine general medical examination at a health care facility 03/23/2011  . Hyperlipemia 02/11/2009  . Allergic rhinitis due to pollen 02/11/2009  . Mild intermittent asthma 02/11/2009  . GERD 02/11/2009    Past Surgical History:  Procedure Laterality Date  . ABDOMINAL HYSTERECTOMY    . VAGINAL DELIVERY     x2    Prior to Admission medications   Medication Sig Start Date End Date Taking? Authorizing Provider  albuterol (PROAIR HFA) 108 (90 Base) MCG/ACT inhaler Inhale 2 puffs into the lungs every 6 (six) hours as needed for wheezing or shortness of breath. 04/06/16   Venia Carbon, MD  cetirizine (ZYRTEC) 10 MG tablet Take 10 mg by mouth daily.      [provider]   fluticasone (FLOVENT HFA) 44 MCG/ACT inhaler Use 2 puffs daily 04/06/16   Venia Carbon, MD  ibuprofen (ADVIL,MOTRIN) 200 MG tablet Take 400 mg by mouth 2 (two) times daily.    [provider]    No Known Allergies  Family History  Problem Relation Age of Onset  . Arthritis Mother   . Diabetes Mother   . Hypertension Mother   . Colon cancer Father 53  . Breast cancer Daughter 34  . Heart disease Neg Hx     Social History Social History  Substance Use Topics  . Smoking status: Never Smoker  . Smokeless tobacco: Never Used  . Alcohol use No    Review of Systems Constitutional: Negative for fever. Cardiovascular: Negative for chest pain. Respiratory: Negative for shortness of breath. Gastrointestinal: Negative for abdominal pain Musculoskeletal: no appreciable edema, nontender calf. Skin: Negative for rash. Neurological: Negative for headache All other ROS negative  ____________________________________________   PHYSICAL EXAM:  VITAL SIGNS: ED Triage Vitals  Enc Vitals Group     BP 04/08/17 1127 (!) 140/91     Pulse --      Resp 04/08/17 1127 18     Temp 04/08/17 1127 98.4 F (36.9 C)     Temp Source 04/08/17 1127 Oral     SpO2 04/08/17 1127 96 %     Weight 04/08/17 1128 193 lb (87.5 kg)     Height --      Head Circumference --  Peak Flow --      Pain Score 04/08/17 1127 8     Pain Loc --      Pain Edu? --      Excl. in [place]? --     Constitutional: Alert and oriented. Well appearing and in no distress. Eyes: Normal exam ENT   Head: Normocephalic and atraumatic   Mouth/Throat: Mucous membranes are moist. Cardiovascular: Normal rate, regular rhythm. No murmur Respiratory: Normal respiratory effort without tachypnea nor retractions. Breath sounds are clear Gastrointestinal: Soft and nontender. No distention.   Musculoskeletal: Nontender with normal range of motion in all extremities. No lower extremity tenderness or  edema. Neurologic:  Normal speech and language. No gross focal neurologic deficits Skin:  Skin is warm, dry and intact.  Psychiatric: Mood and affect are normal.  ____________________________________________   RADIOLOGY  ultrasound negative for DVT  ____________________________________________   INITIAL IMPRESSION / ASSESSMENT AND PLAN / ED COURSE  Pertinent labs & imaging results that were available during my care of the patient were reviewed by me and considered in my medical decision making (see chart for details).  patient with left leg discomfort behind the left knee times one week. No appreciable edema, nontender calf. No erythema. Overall the patient appears very well, no distress. We will obtain an not sound to rule out DVT. Patient agreeable to plan.  ultrasound negative for DVT. Suspect likely musculoskeletal discomfort. Patient will be discharged home with PCP follow-up.  ____________________________________________   FINAL CLINICAL IMPRESSION(S) / ED DIAGNOSES  left leg pain    Harvest Dark, MD 04/08/17 1246

## 2017-04-08 NOTE — ED Triage Notes (Signed)
Pt to ed with c/o left lower leg pain and swelling x 1 week. Denies sob.

## 2017-04-11 ENCOUNTER — Encounter: Payer: Self-pay | Admitting: Internal Medicine

## 2017-04-11 ENCOUNTER — Ambulatory Visit (INDEPENDENT_AMBULATORY_CARE_PROVIDER_SITE_OTHER): Payer: Medicare Other | Admitting: Internal Medicine

## 2017-04-11 VITALS — BP 118/82 | HR 78 | Temp 98.4°F | Ht 62.5 in | Wt 197.0 lb

## 2017-04-11 DIAGNOSIS — E785 Hyperlipidemia, unspecified: Secondary | ICD-10-CM | POA: Diagnosis not present

## 2017-04-11 DIAGNOSIS — M159 Polyosteoarthritis, unspecified: Secondary | ICD-10-CM

## 2017-04-11 DIAGNOSIS — Z23 Encounter for immunization: Secondary | ICD-10-CM | POA: Diagnosis not present

## 2017-04-11 DIAGNOSIS — G4730 Sleep apnea, unspecified: Secondary | ICD-10-CM | POA: Diagnosis not present

## 2017-04-11 DIAGNOSIS — Z0000 Encounter for general adult medical examination without abnormal findings: Secondary | ICD-10-CM | POA: Diagnosis not present

## 2017-04-11 DIAGNOSIS — J4520 Mild intermittent asthma, uncomplicated: Secondary | ICD-10-CM | POA: Diagnosis not present

## 2017-04-11 DIAGNOSIS — Z7189 Other specified counseling: Secondary | ICD-10-CM | POA: Diagnosis not present

## 2017-04-11 DIAGNOSIS — M150 Primary generalized (osteo)arthritis: Secondary | ICD-10-CM | POA: Diagnosis not present

## 2017-04-11 DIAGNOSIS — G4739 Other sleep apnea: Secondary | ICD-10-CM | POA: Insufficient documentation

## 2017-04-11 NOTE — Assessment & Plan Note (Signed)
Daughter who is nurse, notes some apnea Unclear how much daytime symptoms Will set up with pulm for eval--but she wants to hold off

## 2017-04-11 NOTE — Assessment & Plan Note (Signed)
Quiet Inconsistent with inhalers but no sig symptoms Gets DOE on steps--seems to be deconditioning

## 2017-04-11 NOTE — Addendum Note (Signed)
Addended by: Pilar Grammes on: 04/11/2017 10:54 AM   Modules accepted: Orders

## 2017-04-11 NOTE — Assessment & Plan Note (Signed)
Mild symptoms Leg pain seems muscular--using ibuprofen briefly

## 2017-04-11 NOTE — Progress Notes (Signed)
Subjective:    Patient ID: Natalie [name], female    DOB: 1950/12/02, 66 y.o.   MRN: [id number]  HPI Here for Medicare wellness and follow up of chronic health conditions Reviewed form and advanced directives Reviewed other doctors No alcohol or tobacco Tries to walk some--but not lately. Sitting at work all day No falls No depression or anhedonia Vision is okay with glasses. Early cataracts Hearing is fine Independent with instrumental ADLs  Still has some left pain ER evaluation 3 days ago--no DVT or Baker's cyst seen Still stiffens up Tried advil tid for a few days---some help  Gets DOE--on steps No regular wheezing and rare cough (like in the cold) Doesn't use either of the inhalers regularly  Some left thumb pain Relates to work Needed a splint due to some intermittent dislocation  No heartburn No dysphagia  Still prefers no Rx for cholesterol  Current Outpatient Prescriptions on File Prior to Visit  Medication Sig Dispense Refill  . albuterol (PROAIR HFA) 108 (90 Base) MCG/ACT inhaler Inhale 2 puffs into the lungs every 6 (six) hours as needed for wheezing or shortness of breath. 1 Inhaler 5  . cetirizine (ZYRTEC) 10 MG tablet Take 10 mg by mouth daily.      . fluticasone (FLOVENT HFA) 44 MCG/ACT inhaler Use 2 puffs daily 10.6 g 11  . ibuprofen (ADVIL,MOTRIN) 200 MG tablet Take 400 mg by mouth 2 (two) times daily.     No current facility-administered medications on file prior to visit.     No Known Allergies  Past Medical History:  Diagnosis Date  . Allergy   . Asthma   . Cataract   . GERD (gastroesophageal reflux disease)   . Heart murmur    was told years ago  . History of shingles 2006  . Hyperlipidemia     Past Surgical History:  Procedure Laterality Date  . ABDOMINAL HYSTERECTOMY    . VAGINAL DELIVERY     x2    Family History  Problem Relation Age of Onset  . Arthritis Mother   . Diabetes Mother   . Hypertension Mother   . Colon  cancer Father 52  . Breast cancer Daughter 10  . Heart disease Neg Hx     Social History   Social History  . Marital status: Single    Spouse name: N/A  . Number of children: 2  . Years of education: N/A   Occupational History  . 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 Smoking status: Never Smoker  . Smokeless tobacco: Never Used  . Alcohol use No  . Drug use: No  . Sexual activity: Not on file   Other Topics Concern  . Not on file   Social History Narrative   No living will   Requests daughter [name] as health care POA   Not sure about resuscitation   Not sure about tube feeds   Review of Systems Not a good sleeper--- daughter thinks she stops breathing at times. Takes a while to get going. No recent daytime somnolence Appetite is fine Weight stable Wears seat belt Teeth okay--keeps up with dentist. Partial on top Some back pain Bowels are fine. No blood. No urinary problems. Discussed safe sex Goes yearly for mammogram--going later this month Some spots on skin. Has seb keratosis on abdomen (reassured)    Objective:   Physical Exam  Constitutional: She is oriented to person, place, and time. She appears  well-nourished. No distress.  HENT:  Mouth/Throat: Oropharynx is clear and moist. No oropharyngeal exudate.  Neck: No thyromegaly present.  Cardiovascular: Normal rate, regular rhythm, normal heart sounds and intact distal pulses.  Exam reveals no gallop.   No murmur heard. Pulmonary/Chest: Effort normal and breath sounds normal. No respiratory distress. She has no wheezes. She has no rales.  Abdominal: Soft. There is no tenderness.  Musculoskeletal: She exhibits no edema or tenderness.  Lymphadenopathy:    She has no cervical adenopathy.  Neurological: She is alert and oriented to person, place, and time.  President-- "Trump, Obama, Clinton----Bush" [id number] D-l-r-o-w Recall 3/3  Skin: No rash noted.    Psychiatric: She has a normal mood and affect. Her behavior is normal.          Assessment & Plan:

## 2017-04-11 NOTE — Assessment & Plan Note (Signed)
See social history Blank forms given 

## 2017-04-11 NOTE — Patient Instructions (Signed)
DASH Eating Plan DASH stands for "Dietary Approaches to Stop Hypertension." The DASH eating plan is a healthy eating plan that has been shown to reduce high blood pressure (hypertension). It may also reduce your risk for type 2 diabetes, heart disease, and stroke. The DASH eating plan may also help with weight loss. What are tips for following this plan? General guidelines  Avoid eating more than 2,300 mg (milligrams) of salt (sodium) a day. If you have hypertension, you may need to reduce your sodium intake to 1,500 mg a day.  Limit alcohol intake to no more than 1 drink a day for nonpregnant women and 2 drinks a day for men. One drink equals 12 oz of beer, 5 oz of wine, or 1 oz of hard liquor.  Work with your health care provider to maintain a healthy body weight or to lose weight. Ask what an ideal weight is for you.  Get at least 30 minutes of exercise that causes your heart to beat faster (aerobic exercise) most days of the week. Activities may include walking, swimming, or biking.  Work with your health care provider or diet and nutrition specialist (dietitian) to adjust your eating plan to your individual calorie needs. Reading food labels  Check food labels for the amount of sodium per serving. Choose foods with less than 5 percent of the Daily Value of sodium. Generally, foods with less than 300 mg of sodium per serving fit into this eating plan.  To find whole grains, look for the word "whole" as the first word in the ingredient list. Shopping  Buy products labeled as "low-sodium" or "no salt added."  Buy fresh foods. Avoid canned foods and premade or frozen meals. Cooking  Avoid adding salt when cooking. Use salt-free seasonings or herbs instead of table salt or sea salt. Check with your health care provider or pharmacist before using salt substitutes.  Do not fry foods. Cook foods using healthy methods such as baking, boiling, grilling, and broiling instead.  Cook with  heart-healthy oils, such as olive, canola, soybean, or sunflower oil. Meal planning   Eat a balanced diet that includes: ? 5 or more servings of fruits and vegetables each day. At each meal, try to fill half of your plate with fruits and vegetables. ? Up to 6-8 servings of whole grains each day. ? Less than 6 oz of lean meat, poultry, or fish each day. A 3-oz serving of meat is about the same size as a deck of cards. One egg equals 1 oz. ? 2 servings of low-fat dairy each day. ? A serving of nuts, seeds, or beans 5 times each week. ? Heart-healthy fats. Healthy fats called Omega-3 fatty acids are found in foods such as flaxseeds and coldwater fish, like sardines, salmon, and mackerel.  Limit how much you eat of the following: ? Canned or prepackaged foods. ? Food that is high in trans fat, such as fried foods. ? Food that is high in saturated fat, such as fatty meat. ? Sweets, desserts, sugary drinks, and other foods with added sugar. ? Full-fat dairy products.  Do not salt foods before eating.  Try to eat at least 2 vegetarian meals each week.  Eat more home-cooked food and less restaurant, buffet, and fast food.  When eating at a restaurant, ask that your food be prepared with less salt or no salt, if possible. What foods are recommended? The items listed may not be a complete list. Talk with your dietitian about what   dietary choices are best for you. Grains Whole-grain or whole-wheat bread. Whole-grain or whole-wheat pasta. Brown rice. Oatmeal. Quinoa. Bulgur. Whole-grain and low-sodium cereals. Pita bread. Low-fat, low-sodium crackers. Whole-wheat flour tortillas. Vegetables Fresh or frozen vegetables (raw, steamed, roasted, or grilled). Low-sodium or reduced-sodium tomato and vegetable juice. Low-sodium or reduced-sodium tomato sauce and tomato paste. Low-sodium or reduced-sodium canned vegetables. Fruits All fresh, dried, or frozen fruit. Canned fruit in natural juice (without  added sugar). Meat and other protein foods Skinless chicken or turkey. Ground chicken or turkey. Pork with fat trimmed off. Fish and seafood. Egg whites. Dried beans, peas, or lentils. Unsalted nuts, nut butters, and seeds. Unsalted canned beans. Lean cuts of beef with fat trimmed off. Low-sodium, lean deli meat. Dairy Low-fat (1%) or fat-free (skim) milk. Fat-free, low-fat, or reduced-fat cheeses. Nonfat, low-sodium ricotta or cottage cheese. Low-fat or nonfat yogurt. Low-fat, low-sodium cheese. Fats and oils Soft margarine without trans fats. Vegetable oil. Low-fat, reduced-fat, or light mayonnaise and salad dressings (reduced-sodium). Canola, safflower, olive, soybean, and sunflower oils. Avocado. Seasoning and other foods Herbs. Spices. Seasoning mixes without salt. Unsalted popcorn and pretzels. Fat-free sweets. What foods are not recommended? The items listed may not be a complete list. Talk with your dietitian about what dietary choices are best for you. Grains Baked goods made with fat, such as croissants, muffins, or some breads. Dry pasta or rice meal packs. Vegetables Creamed or fried vegetables. Vegetables in a cheese sauce. Regular canned vegetables (not low-sodium or reduced-sodium). Regular canned tomato sauce and paste (not low-sodium or reduced-sodium). Regular tomato and vegetable juice (not low-sodium or reduced-sodium). Pickles. Olives. Fruits Canned fruit in a light or heavy syrup. Fried fruit. Fruit in cream or butter sauce. Meat and other protein foods Fatty cuts of meat. Ribs. Fried meat. Bacon. Sausage. Bologna and other processed lunch meats. Salami. Fatback. Hotdogs. Bratwurst. Salted nuts and seeds. Canned beans with added salt. Canned or smoked fish. Whole eggs or egg yolks. Chicken or turkey with skin. Dairy Whole or 2% milk, cream, and half-and-half. Whole or full-fat cream cheese. Whole-fat or sweetened yogurt. Full-fat cheese. Nondairy creamers. Whipped toppings.  Processed cheese and cheese spreads. Fats and oils Butter. Stick margarine. Lard. Shortening. Ghee. Bacon fat. Tropical oils, such as coconut, palm kernel, or palm oil. Seasoning and other foods Salted popcorn and pretzels. Onion salt, garlic salt, seasoned salt, table salt, and sea salt. Worcestershire sauce. Tartar sauce. Barbecue sauce. Teriyaki sauce. Soy sauce, including reduced-sodium. Steak sauce. Canned and packaged gravies. Fish sauce. Oyster sauce. Cocktail sauce. Horseradish that you find on the shelf. Ketchup. Mustard. Meat flavorings and tenderizers. Bouillon cubes. Hot sauce and Tabasco sauce. Premade or packaged marinades. Premade or packaged taco seasonings. Relishes. Regular salad dressings. Where to find more information:  National Heart, Lung, and Blood Institute: www.nhlbi.nih.gov  American Heart Association: www.heart.org Summary  The DASH eating plan is a healthy eating plan that has been shown to reduce high blood pressure (hypertension). It may also reduce your risk for type 2 diabetes, heart disease, and stroke.  With the DASH eating plan, you should limit salt (sodium) intake to 2,300 mg a day. If you have hypertension, you may need to reduce your sodium intake to 1,500 mg a day.  When on the DASH eating plan, aim to eat more fresh fruits and vegetables, whole grains, lean proteins, low-fat dairy, and heart-healthy fats.  Work with your health care provider or diet and nutrition specialist (dietitian) to adjust your eating plan to your individual   calorie needs. This information is not intended to replace advice given to you by your health care provider. Make sure you discuss any questions you have with your health care provider. Document Released: 07/14/2011 Document Revised: 07/18/2016 Document Reviewed: 07/18/2016 Elsevier Interactive Patient Education  2017 Elsevier Inc.  

## 2017-04-11 NOTE — Assessment & Plan Note (Signed)
Mild Prefers no meds still

## 2017-04-11 NOTE — Assessment & Plan Note (Signed)
I have personally reviewed the Medicare Annual Wellness questionnaire and have noted 1. The patient's medical and social history 2. Their use of alcohol, tobacco or illicit drugs 3. Their current medications and supplements 4. The patient's functional ability including ADL's, fall risks, home safety risks and hearing or visual             impairment. 5. Diet and physical activities 6. Evidence for depression or mood disorders  The patients weight, height, BMI and visual acuity have been recorded in the chart I have made referrals, counseling and provided education to the patient based review of the above and I have provided the pt with a written personalized care plan for preventive services.  I have provided you with a copy of your personalized plan for preventive services. Please take the time to review along with your updated medication list.  Due for colon next month Getting yearly mammogram later this month Will accept flu vaccine today--but prefers no pneumonia vaccines Discussed exercise No pap due to age

## 2017-04-12 LAB — COMPREHENSIVE METABOLIC PANEL
ALBUMIN: 4.3 g/dL (ref 3.6–4.8)
ALK PHOS: 61 IU/L (ref 39–117)
ALT: 9 IU/L (ref 0–32)
AST: 22 IU/L (ref 0–40)
Albumin/Globulin Ratio: 1.3 (ref 1.2–2.2)
BILIRUBIN TOTAL: 0.2 mg/dL (ref 0.0–1.2)
BUN / CREAT RATIO: 15 (ref 12–28)
BUN: 13 mg/dL (ref 8–27)
CHLORIDE: 102 mmol/L (ref 96–106)
CO2: 24 mmol/L (ref 20–29)
Calcium: 9.7 mg/dL (ref 8.7–10.3)
Creatinine, Ser: 0.88 mg/dL (ref 0.57–1.00)
GFR calc Af Amer: 78 mL/min/{1.73_m2} (ref >59)
GFR calc non Af Amer: 68 mL/min/{1.73_m2} (ref >59)
GLUCOSE: 89 mg/dL (ref 65–99)
Globulin, Total: 3.2 g/dL (ref 1.5–4.5)
Potassium: 4.1 mmol/L (ref 3.5–5.2)
Sodium: 144 mmol/L (ref 134–144)
Total Protein: 7.5 g/dL (ref 6.0–8.5)

## 2017-04-12 LAB — CBC WITH DIFFERENTIAL/PLATELET
BASOS ABS: 0 10*3/uL (ref 0.0–0.2)
Basos: 0 %
EOS (ABSOLUTE): 0.2 10*3/uL (ref 0.0–0.4)
Eos: 5 %
HEMOGLOBIN: 13.7 g/dL (ref 11.1–15.9)
Hematocrit: 41.8 % (ref 34.0–46.6)
IMMATURE GRANULOCYTES: 0 %
Immature Grans (Abs): 0 10*3/uL (ref 0.0–0.1)
LYMPHS ABS: 2.7 10*3/uL (ref 0.7–3.1)
Lymphs: 60 %
MCH: 29.4 pg (ref 26.6–33.0)
MCHC: 32.8 g/dL (ref 31.5–35.7)
MCV: 90 fL (ref 79–97)
MONOCYTES: 7 %
MONOS ABS: 0.3 10*3/uL (ref 0.1–0.9)
NEUTROS PCT: 28 %
Neutrophils Absolute: 1.3 10*3/uL — ABNORMAL LOW (ref 1.4–7.0)
Platelets: 289 10*3/uL (ref 150–379)
RBC: 4.66 x10E6/uL (ref 3.77–5.28)
RDW: 13.6 % (ref 12.3–15.4)
WBC: 4.6 10*3/uL (ref 3.4–10.8)

## 2017-04-12 LAB — LIPID PANEL
CHOLESTEROL TOTAL: 242 mg/dL — AB (ref 100–199)
Chol/HDL Ratio: 4.7 ratio — ABNORMAL HIGH (ref 0.0–4.4)
HDL: 52 mg/dL (ref >39)
LDL Calculated: 161 mg/dL — ABNORMAL HIGH (ref 0–99)
TRIGLYCERIDES: 146 mg/dL (ref 0–149)
VLDL Cholesterol Cal: 29 mg/dL (ref 5–40)

## 2017-04-27 ENCOUNTER — Ambulatory Visit
Admission: RE | Admit: 2017-04-27 | Discharge: 2017-04-27 | Disposition: A | Discharge status: Home / Self Care | Payer: Medicare Other | Source: Ambulatory Visit | Attending: Internal Medicine | Admitting: Internal Medicine

## 2017-04-27 DIAGNOSIS — Z1231 Encounter for screening mammogram for malignant neoplasm of breast: Secondary | ICD-10-CM | POA: Diagnosis not present

## 2017-05-01 ENCOUNTER — Other Ambulatory Visit: Payer: Self-pay | Admitting: Internal Medicine

## 2017-05-01 DIAGNOSIS — R928 Other abnormal and inconclusive findings on diagnostic imaging of breast: Secondary | ICD-10-CM

## 2017-05-01 DIAGNOSIS — N6320 Unspecified lump in the left breast, unspecified quadrant: Secondary | ICD-10-CM

## 2017-05-04 ENCOUNTER — Telehealth: Payer: Self-pay | Admitting: Internal Medicine

## 2017-05-04 NOTE — Telephone Encounter (Signed)
Patient returned Shannon's call.  If patient doesn't answer, you can leave a detailed message. Also, she didn't receive her lab results from 04/11/17.

## 2017-05-18 ENCOUNTER — Ambulatory Visit
Admission: RE | Admit: 2017-05-18 | Discharge: 2017-05-18 | Disposition: A | Discharge status: Home / Self Care | Payer: Medicare Other | Source: Ambulatory Visit | Attending: Internal Medicine | Admitting: Internal Medicine

## 2017-05-18 DIAGNOSIS — N6320 Unspecified lump in the left breast, unspecified quadrant: Secondary | ICD-10-CM | POA: Insufficient documentation

## 2017-05-18 DIAGNOSIS — R928 Other abnormal and inconclusive findings on diagnostic imaging of breast: Secondary | ICD-10-CM | POA: Diagnosis not present

## 2017-05-18 DIAGNOSIS — N6489 Other specified disorders of breast: Secondary | ICD-10-CM | POA: Diagnosis not present

## 2017-06-05 ENCOUNTER — Encounter: Payer: Self-pay | Admitting: Gastroenterology

## 2017-06-15 ENCOUNTER — Encounter: Payer: Self-pay | Admitting: Gastroenterology

## 2017-07-02 DIAGNOSIS — J019 Acute sinusitis, unspecified: Secondary | ICD-10-CM | POA: Diagnosis not present

## 2017-07-02 DIAGNOSIS — J069 Acute upper respiratory infection, unspecified: Secondary | ICD-10-CM | POA: Diagnosis not present

## 2017-08-11 ENCOUNTER — Other Ambulatory Visit: Payer: Self-pay

## 2017-08-11 ENCOUNTER — Ambulatory Visit (AMBULATORY_SURGERY_CENTER): Payer: Self-pay | Admitting: *Deleted

## 2017-08-11 VITALS — Ht 63.5 in | Wt 196.2 lb

## 2017-08-11 DIAGNOSIS — Z86010 Personal history of colonic polyps: Secondary | ICD-10-CM

## 2017-08-11 MED ORDER — NA SULFATE-K SULFATE-MG SULF 17.5-3.13-1.6 GM/177ML PO SOLN: 1.0000 [IU] | Freq: Once | ORAL | 0 refills | Status: AC

## 2017-08-11 NOTE — Progress Notes (Signed)
No egg or soy allergy known to patient  No issues with past sedation with any surgeries  or procedures, no intubation problems  No diet pills per patient No home 02 use per patient  No blood thinners per patient  Pt denies issues with constipation  No A fib or A flutter  EMMI video sent to pt's e mail pt declined   

## 2017-08-17 ENCOUNTER — Encounter: Payer: Self-pay | Admitting: Gastroenterology

## 2017-08-25 ENCOUNTER — Ambulatory Visit (AMBULATORY_SURGERY_CENTER): Payer: Medicare Other | Admitting: Gastroenterology

## 2017-08-25 ENCOUNTER — Other Ambulatory Visit: Payer: Self-pay

## 2017-08-25 ENCOUNTER — Encounter: Payer: Self-pay | Admitting: Gastroenterology

## 2017-08-25 VITALS — BP 122/80 | HR 75 | Temp 98.6°F | Resp 12 | Ht 63.5 in | Wt 196.0 lb

## 2017-08-25 DIAGNOSIS — Z86010 Personal history of colonic polyps: Secondary | ICD-10-CM | POA: Diagnosis present

## 2017-08-25 MED ORDER — SODIUM CHLORIDE 0.9 % IV SOLN: 500.0000 mL | Freq: Once | INTRAVENOUS | Status: DC

## 2017-08-25 NOTE — Progress Notes (Signed)
Report to PACU, RN, vss, BBS= Clear.  

## 2017-08-25 NOTE — Patient Instructions (Signed)
YOU HAD AN ENDOSCOPIC PROCEDURE TODAY AT THE [facility]:   Refer to the procedure report that was given to you for any specific questions about what was found during the examination.  If the procedure report does not answer your questions, please call your gastroenterologist to clarify.  If you requested that your care partner not be given the details of your procedure findings, then the procedure report has been included in a sealed envelope for you to review at your convenience later.  YOU SHOULD EXPECT: Some feelings of bloating in the abdomen. Passage of more gas than usual.  Walking can help get rid of the air that was put into your GI tract during the procedure and reduce the bloating. If you had a lower endoscopy (such as a colonoscopy or flexible sigmoidoscopy) you may notice spotting of blood in your stool or on the toilet paper. If you underwent a bowel prep for your procedure, you may not have a normal bowel movement for a few days.  Please Note:  You might notice some irritation and congestion in your nose or some drainage.  This is from the oxygen used during your procedure.  There is no need for concern and it should clear up in a day or so.  SYMPTOMS TO REPORT IMMEDIATELY:   Following lower endoscopy (colonoscopy or flexible sigmoidoscopy):  Excessive amounts of blood in the stool  Significant tenderness or worsening of abdominal pains  Swelling of the abdomen that is new, acute  Fever of 100F or higher  For urgent or emergent issues, a gastroenterologist can be reached at any hour by calling [phone].   DIET:  We do recommend a small meal at first, but then you may proceed to your regular diet.  Drink plenty of fluids but you should avoid alcoholic beverages for 24 hours.  ACTIVITY:  You should plan to take it easy for the rest of today and you should NOT DRIVE or use heavy machinery until tomorrow (because of the sedation medicines used during the test).     FOLLOW UP: Our staff will call the number listed on your records the next business day following your procedure to check on you and address any questions or concerns that you may have regarding the information given to you following your procedure. If we do not reach you, we will leave a message.  However, if you are feeling well and you are not experiencing any problems, there is no need to return our call.  We will assume that you have returned to your regular daily activities without incident.  If any biopsies were taken you will be contacted by phone or by letter within the next 1-3 weeks.  Please call us at [phone] if you have not heard about the biopsies in 3 weeks.   Repeat Colonoscopy screening in 5 years   SIGNATURES/CONFIDENTIALITY: You and/or your care partner have signed paperwork which will be entered into your electronic medical record.  These signatures attest to the fact that that the information above on your After Visit Summary has been reviewed and is understood.  Full responsibility of the confidentiality of this discharge information lies with you and/or your care-partner. 

## 2017-08-25 NOTE — Op Note (Signed)
[place] Patient Name: Natalie [name] Procedure Date: 08/25/2017 8:51 AM MRN: [id number] Endoscopist: Ladene Artist , MD Age: 67 Referring MD:  Date of Birth: 1951-07-16 Gender: Female Account #: [id number] Procedure:                Colonoscopy Indications:              Surveillance: Personal history of adenomatous                            polyps on last colonoscopy 5 years ago Medicines:                Monitored Anesthesia Care Procedure:                Pre-Anesthesia Assessment:                           - Prior to the procedure, a History and Physical                            was performed, and patient medications and                            allergies were reviewed. The patient's tolerance of                            previous anesthesia was also reviewed. The risks                            and benefits of the procedure and the sedation                            options and risks were discussed with the patient.                            All questions were answered, and informed consent                            was obtained. Prior Anticoagulants: The patient has                            taken no previous anticoagulant or antiplatelet                            agents. ASA Grade Assessment: II - A patient with                            mild systemic disease. After reviewing the risks                            and benefits, the patient was deemed in                            satisfactory condition to undergo the procedure.  After obtaining informed consent, the colonoscope                            was passed under direct vision. Throughout the                            procedure, the patient's blood pressure, pulse, and                            oxygen saturations were monitored continuously. The                            Model PCF-H190DL [id number]) scope was introduced                            through the anus and  advanced to the the cecum,                            identified by appendiceal orifice and ileocecal                            valve. The ileocecal valve, appendiceal orifice,                            and rectum were photographed. The quality of the                            bowel preparation was excellent. The colonoscopy                            was performed without difficulty. The patient                            tolerated the procedure well. Scope In: 9:00:31 AM Scope Out: 9:11:40 AM Scope Withdrawal Time: 0 hours 8 minutes 7 seconds  Total Procedure Duration: 0 hours 11 minutes 9 seconds  Findings:                 The perianal and digital rectal examinations were                            normal.                           The entire examined colon appeared normal on direct                            and retroflexion views. Complications:            No immediate complications. Estimated blood loss:                            None. Estimated Blood Loss:     Estimated blood loss: none. Impression:               - The entire examined colon is normal on direct  and                            retroflexion views.                           - No specimens collected. Recommendation:           - Repeat colonoscopy in 5 years for surveillance.                           - Patient has a contact number available for                            emergencies. The signs and symptoms of potential                            delayed complications were discussed with the                            patient. Return to normal activities tomorrow.                            Written discharge instructions were provided to the                            patient.                           - Resume previous diet.                           - Continue present medications. Ladene Artist, MD 08/25/2017 9:13:55 AM This report has been signed electronically.

## 2017-08-28 ENCOUNTER — Telehealth: Payer: Self-pay

## 2017-08-28 NOTE — Telephone Encounter (Signed)
Number identifier, left a message. 

## 2017-08-28 NOTE — Telephone Encounter (Signed)
Called [phone] and left a messaged we tried to reach pt for a follow up call. maw

## 2017-08-30 DIAGNOSIS — R05 Cough: Secondary | ICD-10-CM | POA: Diagnosis not present

## 2017-08-30 DIAGNOSIS — J069 Acute upper respiratory infection, unspecified: Secondary | ICD-10-CM | POA: Diagnosis not present

## 2017-08-30 DIAGNOSIS — J029 Acute pharyngitis, unspecified: Secondary | ICD-10-CM | POA: Diagnosis not present

## 2017-11-01 ENCOUNTER — Telehealth: Payer: Self-pay | Admitting: Internal Medicine

## 2017-11-01 ENCOUNTER — Other Ambulatory Visit: Payer: Self-pay | Admitting: Internal Medicine

## 2017-11-01 NOTE — Telephone Encounter (Signed)
Copied from [place]. Topic: Quick Communication - See Telephone Encounter >> Nov 01, 2017  9:55 AM Ether Griffins B wrote: CRM for notification. See Telephone encounter for: 11/01/17. Pt states she needs a order placed to [facility] for [place]. Last year thyroid glands where found in breast. They wanted her to come back every 6 months to follow up and recheck. Pt went to make an appt yesterday with them and they told her she needed to contact her PCP to get an order placed before she could be scheduled.

## 2017-11-01 NOTE — Telephone Encounter (Signed)
Spoke to [place] @ [place]  She stated she sent note over to dr Silvio Pate to get order

## 2017-11-02 ENCOUNTER — Telehealth: Payer: Self-pay

## 2017-11-02 DIAGNOSIS — R928 Other abnormal and inconclusive findings on diagnostic imaging of breast: Secondary | ICD-10-CM

## 2017-11-02 NOTE — Telephone Encounter (Signed)
Placed orders at Dr Alla German request

## 2017-11-03 DIAGNOSIS — R6889 Other general symptoms and signs: Secondary | ICD-10-CM | POA: Diagnosis not present

## 2017-11-06 NOTE — Telephone Encounter (Signed)
Pt called back Natalie [name] (see note on MM order). Pt not sure what is needed. She always has exams at [facility] in [place]. Please send them order so she can get this done asap. Call back # [phone] if needed.

## 2017-11-07 NOTE — Telephone Encounter (Signed)
Appointment 4/9

## 2017-11-14 ENCOUNTER — Ambulatory Visit
Admission: RE | Admit: 2017-11-14 | Discharge: 2017-11-14 | Disposition: A | Discharge status: Home / Self Care | Payer: Medicare Other | Source: Ambulatory Visit | Attending: Internal Medicine | Admitting: Internal Medicine

## 2017-11-14 DIAGNOSIS — N6002 Solitary cyst of left breast: Secondary | ICD-10-CM | POA: Diagnosis not present

## 2017-11-14 DIAGNOSIS — R928 Other abnormal and inconclusive findings on diagnostic imaging of breast: Secondary | ICD-10-CM | POA: Diagnosis not present

## 2017-11-14 DIAGNOSIS — N6322 Unspecified lump in the left breast, upper inner quadrant: Secondary | ICD-10-CM | POA: Diagnosis not present

## 2018-01-25 ENCOUNTER — Telehealth: Payer: Self-pay | Admitting: Internal Medicine

## 2018-01-25 MED ORDER — FLUTICASONE PROPIONATE HFA 44 MCG/ACT IN AERO: INHALATION_SPRAY | RESPIRATORY_TRACT | 4 refills | Status: DC

## 2018-01-25 MED ORDER — ALBUTEROL SULFATE HFA 108 (90 BASE) MCG/ACT IN AERS: 2.0000 | INHALATION_SPRAY | Freq: Four times a day (QID) | RESPIRATORY_TRACT | 1 refills | Status: DC | PRN

## 2018-01-25 NOTE — Telephone Encounter (Signed)
Let her know I sent the refills to [facility] for her

## 2018-01-25 NOTE — Telephone Encounter (Signed)
Spoke to pt

## 2018-01-25 NOTE — Telephone Encounter (Signed)
Pt came in requesting a refill for proventil- generic brand and Flovent HFA. She is set up for physical in Oct 2019.

## 2018-02-01 ENCOUNTER — Telehealth: Payer: Self-pay

## 2018-02-01 NOTE — Telephone Encounter (Signed)
She has an AWV coming up in September.

## 2018-02-01 NOTE — Telephone Encounter (Signed)
Left message for pt to call office. I will need to speak to her if she calls back.

## 2018-02-01 NOTE — Telephone Encounter (Signed)
There were no cognitive concerns as of last year's visit You can try calling her again and offer appointment if she is having any problems

## 2018-02-01 NOTE — Telephone Encounter (Signed)
Copied from [place] [phone]. Topic: Inquiry >> Jan 31, 2018  4:51 PM Oliver Pila B wrote: Reason for CRM: pt's daughter called to state the family is worried about pt's mental health, dementia runs in family; pt's daughter will try to have pt come in for a visit but no one is on pt's dpr and family would like to see if pcp would reach out to pt  >> Jan 31, 2018  5:11 PM Helene Shoe, LPN wrote: I tired calling pt and no answer and v/m said pt is not available.

## 2018-05-14 ENCOUNTER — Encounter: Payer: Self-pay | Admitting: Internal Medicine

## 2018-05-14 ENCOUNTER — Ambulatory Visit (INDEPENDENT_AMBULATORY_CARE_PROVIDER_SITE_OTHER): Payer: Medicare Other | Admitting: Internal Medicine

## 2018-05-14 VITALS — BP 124/80 | HR 64 | Temp 98.0°F | Ht 63.5 in | Wt 195.0 lb

## 2018-05-14 DIAGNOSIS — M159 Polyosteoarthritis, unspecified: Secondary | ICD-10-CM

## 2018-05-14 DIAGNOSIS — J4520 Mild intermittent asthma, uncomplicated: Secondary | ICD-10-CM

## 2018-05-14 DIAGNOSIS — E785 Hyperlipidemia, unspecified: Secondary | ICD-10-CM | POA: Diagnosis not present

## 2018-05-14 DIAGNOSIS — G4730 Sleep apnea, unspecified: Secondary | ICD-10-CM

## 2018-05-14 DIAGNOSIS — Z0000 Encounter for general adult medical examination without abnormal findings: Secondary | ICD-10-CM

## 2018-05-14 DIAGNOSIS — Z23 Encounter for immunization: Secondary | ICD-10-CM | POA: Diagnosis not present

## 2018-05-14 DIAGNOSIS — M150 Primary generalized (osteo)arthritis: Secondary | ICD-10-CM

## 2018-05-14 DIAGNOSIS — Z7189 Other specified counseling: Secondary | ICD-10-CM

## 2018-05-14 LAB — CBC
HCT: 42.4 % (ref 36.0–46.0)
Hemoglobin: 14.1 g/dL (ref 12.0–15.0)
MCHC: 33.4 g/dL (ref 30.0–36.0)
MCV: 89.2 fl (ref 78.0–100.0)
Platelets: 255 10*3/uL (ref 150.0–400.0)
RBC: 4.75 Mil/uL (ref 3.87–5.11)
RDW: 13.8 % (ref 11.5–15.5)
WBC: 4.1 10*3/uL (ref 4.0–10.5)

## 2018-05-14 LAB — COMPREHENSIVE METABOLIC PANEL
ALT: 10 U/L (ref 0–35)
AST: 18 U/L (ref 0–37)
Albumin: 4 g/dL (ref 3.5–5.2)
Alkaline Phosphatase: 61 U/L (ref 39–117)
BILIRUBIN TOTAL: 0.5 mg/dL (ref 0.2–1.2)
BUN: 11 mg/dL (ref 6–23)
CALCIUM: 9.4 mg/dL (ref 8.4–10.5)
CO2: 33 meq/L — AB (ref 19–32)
Chloride: 103 mEq/L (ref 96–112)
Creatinine, Ser: 0.78 mg/dL (ref 0.40–1.20)
GFR: 94.08 mL/min (ref >60.00)
GLUCOSE: 99 mg/dL (ref 70–99)
Potassium: 4 mEq/L (ref 3.5–5.1)
Sodium: 140 mEq/L (ref 135–145)
Total Protein: 7.1 g/dL (ref 6.0–8.3)

## 2018-05-14 LAB — LIPID PANEL
CHOL/HDL RATIO: 5
Cholesterol: 228 mg/dL — ABNORMAL HIGH (ref 0–200)
HDL: 48.6 mg/dL (ref >39.00)
LDL Cholesterol: 146 mg/dL — ABNORMAL HIGH (ref 0–99)
NONHDL: 179.77
TRIGLYCERIDES: 167 mg/dL — AB (ref 0.0–149.0)
VLDL: 33.4 mg/dL (ref 0.0–40.0)

## 2018-05-14 NOTE — Assessment & Plan Note (Signed)
Still prefers no Rx

## 2018-05-14 NOTE — Assessment & Plan Note (Signed)
Still with symptoms and some sleepiness Will set up pulm for sleep eval

## 2018-05-14 NOTE — Patient Instructions (Signed)
DASH Eating Plan DASH stands for "Dietary Approaches to Stop Hypertension." The DASH eating plan is a healthy eating plan that has been shown to reduce high blood pressure (hypertension). It may also reduce your risk for type 2 diabetes, heart disease, and stroke. The DASH eating plan may also help with weight loss. What are tips for following this plan? General guidelines  Avoid eating more than 2,300 mg (milligrams) of salt (sodium) a day. If you have hypertension, you may need to reduce your sodium intake to 1,500 mg a day.  Limit alcohol intake to no more than 1 drink a day for nonpregnant women and 2 drinks a day for men. One drink equals 12 oz of beer, 5 oz of wine, or 1 oz of hard liquor.  Work with your health care provider to maintain a healthy body weight or to lose weight. Ask what an ideal weight is for you.  Get at least 30 minutes of exercise that causes your heart to beat faster (aerobic exercise) most days of the week. Activities may include walking, swimming, or biking.  Work with your health care provider or diet and nutrition specialist (dietitian) to adjust your eating plan to your individual calorie needs. Reading food labels  Check food labels for the amount of sodium per serving. Choose foods with less than 5 percent of the Daily Value of sodium. Generally, foods with less than 300 mg of sodium per serving fit into this eating plan.  To find whole grains, look for the word "whole" as the first word in the ingredient list. Shopping  Buy products labeled as "low-sodium" or "no salt added."  Buy fresh foods. Avoid canned foods and premade or frozen meals. Cooking  Avoid adding salt when cooking. Use salt-free seasonings or herbs instead of table salt or sea salt. Check with your health care provider or pharmacist before using salt substitutes.  Do not fry foods. Cook foods using healthy methods such as baking, boiling, grilling, and broiling instead.  Cook with  heart-healthy oils, such as olive, canola, soybean, or sunflower oil. Meal planning   Eat a balanced diet that includes: ? 5 or more servings of fruits and vegetables each day. At each meal, try to fill half of your plate with fruits and vegetables. ? Up to 6-8 servings of whole grains each day. ? Less than 6 oz of lean meat, poultry, or fish each day. A 3-oz serving of meat is about the same size as a deck of cards. One egg equals 1 oz. ? 2 servings of low-fat dairy each day. ? A serving of nuts, seeds, or beans 5 times each week. ? Heart-healthy fats. Healthy fats called Omega-3 fatty acids are found in foods such as flaxseeds and coldwater fish, like sardines, salmon, and mackerel.  Limit how much you eat of the following: ? Canned or prepackaged foods. ? Food that is high in trans fat, such as fried foods. ? Food that is high in saturated fat, such as fatty meat. ? Sweets, desserts, sugary drinks, and other foods with added sugar. ? Full-fat dairy products.  Do not salt foods before eating.  Try to eat at least 2 vegetarian meals each week.  Eat more home-cooked food and less restaurant, buffet, and fast food.  When eating at a restaurant, ask that your food be prepared with less salt or no salt, if possible. What foods are recommended? The items listed may not be a complete list. Talk with your dietitian about what   dietary choices are best for you. Grains Whole-grain or whole-wheat bread. Whole-grain or whole-wheat pasta. Brown rice. Oatmeal. Quinoa. Bulgur. Whole-grain and low-sodium cereals. Pita bread. Low-fat, low-sodium crackers. Whole-wheat flour tortillas. Vegetables Fresh or frozen vegetables (raw, steamed, roasted, or grilled). Low-sodium or reduced-sodium tomato and vegetable juice. Low-sodium or reduced-sodium tomato sauce and tomato paste. Low-sodium or reduced-sodium canned vegetables. Fruits All fresh, dried, or frozen fruit. Canned fruit in natural juice (without  added sugar). Meat and other protein foods Skinless chicken or turkey. Ground chicken or turkey. Pork with fat trimmed off. Fish and seafood. Egg whites. Dried beans, peas, or lentils. Unsalted nuts, nut butters, and seeds. Unsalted canned beans. Lean cuts of beef with fat trimmed off. Low-sodium, lean deli meat. Dairy Low-fat (1%) or fat-free (skim) milk. Fat-free, low-fat, or reduced-fat cheeses. Nonfat, low-sodium ricotta or cottage cheese. Low-fat or nonfat yogurt. Low-fat, low-sodium cheese. Fats and oils Soft margarine without trans fats. Vegetable oil. Low-fat, reduced-fat, or light mayonnaise and salad dressings (reduced-sodium). Canola, safflower, olive, soybean, and sunflower oils. Avocado. Seasoning and other foods Herbs. Spices. Seasoning mixes without salt. Unsalted popcorn and pretzels. Fat-free sweets. What foods are not recommended? The items listed may not be a complete list. Talk with your dietitian about what dietary choices are best for you. Grains Baked goods made with fat, such as croissants, muffins, or some breads. Dry pasta or rice meal packs. Vegetables Creamed or fried vegetables. Vegetables in a cheese sauce. Regular canned vegetables (not low-sodium or reduced-sodium). Regular canned tomato sauce and paste (not low-sodium or reduced-sodium). Regular tomato and vegetable juice (not low-sodium or reduced-sodium). Pickles. Olives. Fruits Canned fruit in a light or heavy syrup. Fried fruit. Fruit in cream or butter sauce. Meat and other protein foods Fatty cuts of meat. Ribs. Fried meat. Bacon. Sausage. Bologna and other processed lunch meats. Salami. Fatback. Hotdogs. Bratwurst. Salted nuts and seeds. Canned beans with added salt. Canned or smoked fish. Whole eggs or egg yolks. Chicken or turkey with skin. Dairy Whole or 2% milk, cream, and half-and-half. Whole or full-fat cream cheese. Whole-fat or sweetened yogurt. Full-fat cheese. Nondairy creamers. Whipped toppings.  Processed cheese and cheese spreads. Fats and oils Butter. Stick margarine. Lard. Shortening. Ghee. Bacon fat. Tropical oils, such as coconut, palm kernel, or palm oil. Seasoning and other foods Salted popcorn and pretzels. Onion salt, garlic salt, seasoned salt, table salt, and sea salt. Worcestershire sauce. Tartar sauce. Barbecue sauce. Teriyaki sauce. Soy sauce, including reduced-sodium. Steak sauce. Canned and packaged gravies. Fish sauce. Oyster sauce. Cocktail sauce. Horseradish that you find on the shelf. Ketchup. Mustard. Meat flavorings and tenderizers. Bouillon cubes. Hot sauce and Tabasco sauce. Premade or packaged marinades. Premade or packaged taco seasonings. Relishes. Regular salad dressings. Where to find more information:  National Heart, Lung, and Blood Institute: www.nhlbi.nih.gov  American Heart Association: www.heart.org Summary  The DASH eating plan is a healthy eating plan that has been shown to reduce high blood pressure (hypertension). It may also reduce your risk for type 2 diabetes, heart disease, and stroke.  With the DASH eating plan, you should limit salt (sodium) intake to 2,300 mg a day. If you have hypertension, you may need to reduce your sodium intake to 1,500 mg a day.  When on the DASH eating plan, aim to eat more fresh fruits and vegetables, whole grains, lean proteins, low-fat dairy, and heart-healthy fats.  Work with your health care provider or diet and nutrition specialist (dietitian) to adjust your eating plan to your individual   calorie needs. This information is not intended to replace advice given to you by your health care provider. Make sure you discuss any questions you have with your health care provider. Document Released: 07/14/2011 Document Revised: 07/18/2016 Document Reviewed: 07/18/2016 Elsevier Interactive Patient Education  2018 Elsevier Inc.  

## 2018-05-14 NOTE — Assessment & Plan Note (Signed)
Mostly knees Uses the ibuprofen

## 2018-05-14 NOTE — Addendum Note (Signed)
Addended by: Jacqualin Combes on: 05/14/2018 12:37 PM   Modules accepted: Orders

## 2018-05-14 NOTE — Progress Notes (Signed)
Subjective:    Patient ID: Natalie [name], female    DOB: 13-Aug-1950, 67 y.o.   MRN: [id number]  HPI Here for Medicare wellness visit and follow up of chronic health conditions Reviewed form and advanced directives Reviewed other doctors No alcohol or tobacco Does try to walk occasionally Vision and hearing are okay No falls No depression or anhedonia Independent with instrumental ADLs No sig memory issues  Daughter still notes some apnea at night Not always refreshed after sleep Some sleep pressure during the day  Asthma seems fairly quiet Just gets SOB doing steps or walking longer distances Hasn't needed the albuterol Does use the flovent sparingly Does keep up with allergy meds OTC No regular wheezing or cough  Some knee pain at times--usually with the weather changes Uses ibuprofen regularly  Reviewed the high cholesterol Still not excited abut medications for this  Current Outpatient Medications on File Prior to Visit  Medication Sig Dispense Refill  . albuterol (PROAIR HFA) 108 (90 Base) MCG/ACT inhaler Inhale 2 puffs into the lungs every 6 (six) hours as needed for wheezing or shortness of breath. 1 Inhaler 1  . cetirizine (ZYRTEC) 10 MG tablet Take 10 mg by mouth daily.      . fluticasone (FLOVENT HFA) 44 MCG/ACT inhaler Use 2 puffs daily 10.6 g 4  . ibuprofen (ADVIL,MOTRIN) 200 MG tablet Take 400 mg by mouth 2 (two) times daily.     No current facility-administered medications on file prior to visit.     No Known Allergies  Past Medical History:  Diagnosis Date  . Allergy   . Arthritis   . Asthma   . Cataract   . GERD (gastroesophageal reflux disease)    pt. denied  . Heart murmur    was told years ago  . History of shingles 2006  . Hyperlipidemia     Past Surgical History:  Procedure Laterality Date  . ABDOMINAL HYSTERECTOMY    . COLONOSCOPY    . POLYPECTOMY    . VAGINAL DELIVERY     x2    Family History  Problem Relation Age of  Onset  . Arthritis Mother   . Diabetes Mother   . Hypertension Mother   . Liver cancer Mother   . Colon cancer Father 8  . Pancreatic cancer Brother   . Colon polyps Brother   . Breast cancer Daughter 53  . Heart disease Neg Hx   . Esophageal cancer Neg Hx   . Rectal cancer Neg Hx   . Stomach cancer Neg Hx     Social History   Socioeconomic History  . Marital status: Single    Spouse name: Not on file  . Number of children: 2  . Years of education: Not on file  . Highest education level: Not on file  Occupational History  . 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 Financial resource strain: Not on file  . Food insecurity:    Worry: Not on file    Inability: Not on file  . Transportation needs:    Medical: Not on file    Non-medical: Not on file  Tobacco Use  . Smoking status: Never Smoker  . Smokeless tobacco: Never Used  Substance and Sexual Activity  . Alcohol use: No  . Drug use: No  . Sexual activity: Not on file  Lifestyle  . Physical activity:    Days per week: Not on file  Minutes per session: Not on file  . Stress: Not on file  Relationships  . Social connections:    Talks on phone: Not on file    Gets together: Not on file    Attends religious service: Not on file    Active member of club or organization: Not on file    Attends meetings of clubs or organizations: Not on file    Relationship status: Not on file  . Intimate partner violence:    Fear of current or ex partner: Not on file    Emotionally abused: Not on file    Physically abused: Not on file    Forced sexual activity: Not on file  Other Topics Concern  . Not on file  Social History Narrative   No living will   Requests daughter Natalie [name] as health care POA   Not sure about resuscitation   Not sure about tube feeds   Review of Systems Appetite is fine Weight stable Sleeping fair--- some issues No heartburn or  dysphagia Variable bowels--some constipation. No blood Left knee Baker's cyst has recurred a couple of times Wears seat belt Teeth okay---regular with dentist Voids fine. No dysuria or hematuria. Some urge incontinence --better since cutting back on soda No chest pain No palpitations No dizziness or syncope Mild ankle puffiness at times Has mole on neck she doesn't like (benign keratosis)     Objective:   Physical Exam  Constitutional: She is oriented to person, place, and time. She appears well-developed. No distress.  HENT:  Mouth/Throat: Oropharynx is clear and moist. No oropharyngeal exudate.  Full upper, partial lower  Neck: No thyromegaly present.  Cardiovascular: Normal rate, regular rhythm, normal heart sounds and intact distal pulses. Exam reveals no gallop.  No murmur heard. Respiratory: Effort normal and breath sounds normal. No respiratory distress. She has no wheezes. She has no rales.  GI: Soft. There is no tenderness.  Musculoskeletal: She exhibits no edema or tenderness.  Lymphadenopathy:    She has no cervical adenopathy.  Neurological: She is alert and oriented to person, place, and time.  President--- "Dwaine Deter, Bush" [phone] [id number] Recall 3/3  Skin: No rash noted. No erythema.  Pedunculated mass on neck (benign)  Psychiatric: She has a normal mood and affect. Her behavior is normal.           Assessment & Plan:

## 2018-05-14 NOTE — Assessment & Plan Note (Signed)
See social history Hasn't done formal documents

## 2018-05-14 NOTE — Assessment & Plan Note (Signed)
I have personally reviewed the Medicare Annual Wellness questionnaire and have noted 1. The patient's medical and social history 2. Their use of alcohol, tobacco or illicit drugs 3. Their current medications and supplements 4. The patient's functional ability including ADL's, fall risks, home safety risks and hearing or visual             impairment. 5. Diet and physical activities 6. Evidence for depression or mood disorders  The patients weight, height, BMI and visual acuity have been recorded in the chart I have made referrals, counseling and provided education to the patient based review of the above and I have provided the pt with a written personalized care plan for preventive services.  I have provided you with a copy of your personalized plan for preventive services. Please take the time to review along with your updated medication list.  Will give flu vaccine--she still prefers no pneumonia vaccines Recent colonoscopy---due again 2024 Keeping up with mammograms--on 6 months recall No pap due to age/hyster Discussed exercise

## 2018-05-14 NOTE — Assessment & Plan Note (Signed)
Doesn't appear to be active Not using inhalers DOE seems to be fitness related

## 2018-06-07 ENCOUNTER — Telehealth: Payer: Self-pay | Admitting: Internal Medicine

## 2018-06-07 DIAGNOSIS — R928 Other abnormal and inconclusive findings on diagnostic imaging of breast: Secondary | ICD-10-CM

## 2018-06-07 NOTE — Telephone Encounter (Signed)
Order placed

## 2018-06-07 NOTE — Telephone Encounter (Signed)
Pt need referral for diagnostic mammogram at [facility] for 6 month fu.

## 2018-06-07 NOTE — Telephone Encounter (Signed)
Please enter appropriate order for me

## 2018-06-08 NOTE — Addendum Note (Signed)
Addended by: Pilar Grammes on: 06/08/2018 09:30 AM   Modules accepted: Orders

## 2018-06-08 NOTE — Telephone Encounter (Signed)
Spoke with [facility] @ [place] they need ultra sound ordered Use [id number]

## 2018-06-08 NOTE — Telephone Encounter (Signed)
Order placed

## 2018-06-27 ENCOUNTER — Ambulatory Visit
Admission: RE | Admit: 2018-06-27 | Discharge: 2018-06-27 | Disposition: A | Discharge status: Home / Self Care | Payer: Medicare Other | Source: Ambulatory Visit | Attending: Internal Medicine | Admitting: Internal Medicine

## 2018-06-27 ENCOUNTER — Other Ambulatory Visit: Payer: Self-pay | Admitting: Internal Medicine

## 2018-06-27 DIAGNOSIS — R928 Other abnormal and inconclusive findings on diagnostic imaging of breast: Secondary | ICD-10-CM

## 2018-06-27 DIAGNOSIS — N6322 Unspecified lump in the left breast, upper inner quadrant: Secondary | ICD-10-CM | POA: Diagnosis not present

## 2018-08-13 ENCOUNTER — Encounter: Payer: Self-pay | Admitting: Internal Medicine

## 2018-08-13 ENCOUNTER — Ambulatory Visit (INDEPENDENT_AMBULATORY_CARE_PROVIDER_SITE_OTHER): Payer: Medicare Other | Admitting: Internal Medicine

## 2018-08-13 VITALS — BP 102/68 | HR 77 | Temp 97.9°F | Resp 18 | Ht 63.5 in | Wt 195.0 lb

## 2018-08-13 DIAGNOSIS — J0100 Acute maxillary sinusitis, unspecified: Secondary | ICD-10-CM | POA: Diagnosis not present

## 2018-08-13 MED ORDER — AMOXICILLIN 500 MG PO TABS: 1000.0000 mg | ORAL_TABLET | Freq: Two times a day (BID) | ORAL | 0 refills | Status: AC

## 2018-08-13 MED ORDER — BENZONATATE 200 MG PO CAPS: 200.0000 mg | ORAL_CAPSULE | Freq: Three times a day (TID) | ORAL | 0 refills | Status: DC | PRN

## 2018-08-13 NOTE — Progress Notes (Signed)
Subjective:    Patient ID: Natalie [name], female    DOB: 1951/02/11, 68 y.o.   MRN: [id number]  HPI Here due to respiratory infection Started over a week ago --12/27 Bad frontal congestion Nasal drainage with blood Bad cough despite robitussin Some chest congestion  No clear fever--but has some chills No sweats Bad cough after doing vacuuming No sig SOB Some wheezing--- used the inhaler once (not clearly helpful then)  No sore throat No ear pain No other Rx besides the robitussin  Current Outpatient Medications on File Prior to Visit  Medication Sig Dispense Refill  . albuterol (PROAIR HFA) 108 (90 Base) MCG/ACT inhaler Inhale 2 puffs into the lungs every 6 (six) hours as needed for wheezing or shortness of breath. 1 Inhaler 1  . cetirizine (ZYRTEC) 10 MG tablet Take 10 mg by mouth daily.      . fluticasone (FLOVENT HFA) 44 MCG/ACT inhaler Use 2 puffs daily 10.6 g 4  . ibuprofen (ADVIL,MOTRIN) 200 MG tablet Take 400 mg by mouth 2 (two) times daily.     No current facility-administered medications on file prior to visit.     No Known Allergies  Past Medical History:  Diagnosis Date  . Allergy   . Arthritis   . Asthma   . Cataract   . GERD (gastroesophageal reflux disease)    pt. denied  . Heart murmur    was told years ago  . History of shingles 2006  . Hyperlipidemia     Past Surgical History:  Procedure Laterality Date  . ABDOMINAL HYSTERECTOMY    . COLONOSCOPY    . POLYPECTOMY    . VAGINAL DELIVERY     x2    Family History  Problem Relation Age of Onset  . Arthritis Mother   . Diabetes Mother   . Hypertension Mother   . Liver cancer Mother   . Colon cancer Father 55  . Pancreatic cancer Brother   . Colon polyps Brother   . Breast cancer Daughter 70  . Heart disease Neg Hx   . Esophageal cancer Neg Hx   . Rectal cancer Neg Hx   . Stomach cancer Neg Hx     Social History   Socioeconomic History  . Marital status: Single    Spouse  name: Not on file  . Number of children: 2  . Years of education: Not on file  . Highest education level: Not on file  Occupational History  . 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 Financial resource strain: Not on file  . Food insecurity:    Worry: Not on file    Inability: Not on file  . Transportation needs:    Medical: Not on file    Non-medical: Not on file  Tobacco Use  . Smoking status: Never Smoker  . Smokeless tobacco: Never Used  Substance and Sexual Activity  . Alcohol use: No  . Drug use: No  . Sexual activity: Not on file  Lifestyle  . Physical activity:    Days per week: Not on file    Minutes per session: Not on file  . Stress: Not on file  Relationships  . Social connections:    Talks on phone: Not on file    Gets together: Not on file    Attends religious service: Not on file    Active member of club or organization: Not on file    Attends  meetings of clubs or organizations: Not on file    Relationship status: Not on file  . Intimate partner violence:    Fear of current or ex partner: Not on file    Emotionally abused: Not on file    Physically abused: Not on file    Forced sexual activity: Not on file  Other Topics Concern  . Not on file  Social History Narrative   No living will   Requests daughter Natalie [name] as health care POA   Not sure about resuscitation   Not sure about tube feeds   Review of Systems  Not sleeping great---- propping up No vomiting or diarrhea Eating okay     Objective:   Physical Exam  Constitutional: She appears well-developed. No distress.  HENT:  No sinus tenderness TMs normal Moderate nasal inflammation Slight pharyngeal injection --no exudates  Neck: No thyromegaly present.  Respiratory: Effort normal and breath sounds normal. No respiratory distress. She has no wheezes. She has no rales.  Lymphadenopathy:    She has no cervical adenopathy.            Assessment & Plan:

## 2018-08-13 NOTE — Assessment & Plan Note (Signed)
10 days and not any better Will try amoxil Tylenol and cough suppressant

## 2018-09-04 ENCOUNTER — Encounter: Payer: Self-pay | Admitting: Family Medicine

## 2018-09-04 ENCOUNTER — Ambulatory Visit (INDEPENDENT_AMBULATORY_CARE_PROVIDER_SITE_OTHER): Payer: Medicare Other | Admitting: Family Medicine

## 2018-09-04 VITALS — BP 110/74 | HR 92 | Temp 98.8°F | Resp 10 | Ht 63.5 in | Wt 192.2 lb

## 2018-09-04 DIAGNOSIS — J4541 Moderate persistent asthma with (acute) exacerbation: Secondary | ICD-10-CM

## 2018-09-04 MED ORDER — PREDNISONE 20 MG PO TABS: 40.0000 mg | ORAL_TABLET | Freq: Every day | ORAL | 0 refills | Status: AC

## 2018-09-04 NOTE — Progress Notes (Signed)
Subjective:     Natalie [name] is a 68 y.o. female presenting for Cough (almost 1 month. Patient was seen on 08/13/2018 by Dr. Silvio Pate and was diagnosed with sinusitis and was prescribed Amoxil. She was not able to get Benzonatate cough medication due to the cost. She has taking Mucinex. having cough spells, chest congestion.)     Cough  This is a new problem. The current episode started more than 1 month ago. The cough is productive of blood-tinged sputum. Associated symptoms include nasal congestion, postnasal drip, shortness of breath and wheezing. Pertinent negatives include no chest pain, chills, ear congestion, ear pain, fever, headaches or rhinorrhea. The symptoms are aggravated by lying down. She has tried OTC cough suppressant and a beta-agonist inhaler (antibtiotics, mucinex) for the symptoms. The treatment provided mild relief. Her past medical history is significant for asthma and environmental allergies.   Completed the antibiotic with some improvement  Does not take the flovent daily due to cost, but has been taking it due to illness  Review of Systems  Constitutional: Negative for chills and fever.  HENT: Positive for postnasal drip. Negative for ear pain and rhinorrhea.   Respiratory: Positive for cough, shortness of breath and wheezing.   Cardiovascular: Negative for chest pain.  Allergic/Immunologic: Positive for environmental allergies.  Neurological: Negative for headaches.   08/13/2018: Clinic - prescribed amoxicillin and tessalon but it was too expensive to take  Social History   Tobacco Use  Smoking Status Never Smoker  Smokeless Tobacco Never Used        Objective:    BP Readings from Last 3 Encounters:  09/04/18 110/74  08/13/18 102/68  05/14/18 124/80   Wt Readings from Last 3 Encounters:  09/04/18 192 lb 4 oz (87.2 kg)  08/13/18 195 lb (88.5 kg)  05/14/18 195 lb (88.5 kg)    BP 110/74   Pulse 92   Temp 98.8 F (37.1 C)   Resp 10   Ht  5' 3.5" (1.613 m)   Wt 192 lb 4 oz (87.2 kg)   SpO2 96%   BMI 33.52 kg/m    Physical Exam Constitutional:      General: She is not in acute distress.    Appearance: She is well-developed. She is not diaphoretic.  HENT:     Head: Normocephalic and atraumatic.     Right Ear: Tympanic membrane and ear canal normal.     Left Ear: Tympanic membrane and ear canal normal.     Nose: Mucosal edema and rhinorrhea present.     Right Sinus: No maxillary sinus tenderness or frontal sinus tenderness.     Left Sinus: No maxillary sinus tenderness or frontal sinus tenderness.     Mouth/Throat:     Pharynx: Uvula midline. Posterior oropharyngeal erythema present. No oropharyngeal exudate.     Tonsils: Swelling: 0 on the right. 0 on the left.  Eyes:     General: No scleral icterus.    Conjunctiva/sclera: Conjunctivae normal.  Neck:     Musculoskeletal: Neck supple.  Cardiovascular:     Rate and Rhythm: Normal rate and regular rhythm.     Heart sounds: Normal heart sounds. No murmur.  Pulmonary:     Effort: Pulmonary effort is normal. No respiratory distress.     Breath sounds: Decreased air movement present. Wheezing present.  Lymphadenopathy:     Cervical: No cervical adenopathy.  Skin:    General: Skin is warm and dry.     Capillary Refill: Capillary  refill takes less than 2 seconds.  Neurological:     Mental Status: She is alert.           Assessment & Plan:   Problem List Items Addressed This Visit      Respiratory   Moderate persistent asthma with exacerbation - Primary    Decreased air movement and wheezing on exam. Not regularly using controller due to cost. Will route to RN to see if patient qualifies for medication assistance.       Relevant Medications   predniSONE (DELTASONE) 20 MG tablet       Return if symptoms worsen or fail to improve.  Lesleigh Noe, MD

## 2018-09-04 NOTE — Patient Instructions (Signed)
Based on your symptoms, it looks like you have a virus.   Antibiotics are not need for a viral infection but the following will help:   1. Drink plenty of fluids 2. Get lots of rest  Sinus Congestion 1) Neti Pot (Saline rinse) -- 2 times day -- if tolerated 2) Flonase (Store Brand ok) - once daily 3) Over the counter congestion medications  Cough 1) Cough drops can be helpful 2) Nyquil (or nighttime cough medication) 3) Honey is proven to be one of the best cough medications   Asthma 1) Take Flovent daily 2) Use albuterol 3 times per day over the next 1-2 days 3) Prednisone daily for 5 days  If you develop fevers (Temperature >100.4), chills, worsening symptoms or symptoms lasting longer than 10 days return to clinic.

## 2018-09-04 NOTE — Assessment & Plan Note (Signed)
Decreased air movement and wheezing on exam. Not regularly using controller due to cost. Will route to RN to see if patient qualifies for medication assistance.

## 2018-09-05 ENCOUNTER — Telehealth: Payer: Self-pay

## 2018-09-05 NOTE — Telephone Encounter (Signed)
Dr. Einar Pheasant requested that I investigate medication assistance coverage options for this patient in regards to her flovent.  Dr. Einar Pheasant saw patient last week for asthma exacerbation.   Patient states that she has not been taking the flovent as she cannot afford the monthly copay of (189.00 per month).   She has [facility] medicare prescription drug coverage.  Due to this issue she does not qualify for [place] [facility] or the online assistance programs/coupons offered through the company.  I spoke with the pharmacist and they suggested considering switching her to one of the following generic inhalers if appropriate:  1. Generic Advair (fluticasone/salmeterol) Wixela inhub  Or   2. Generic Symbicort (budesonide/formoterol fumarate dihydrate) Foracort  The [place] Ruts has actually been on the market longer than the foracort and will likely be the cheaper of the 2.  Due to patient's insurance there aren't any discounts that she qualifies for the Flovent.  Even the good r/x program cannot get it cheaper than 189/mth for patient.    Please advise if appropriate to try a different inhaler.  Thanks.

## 2018-09-06 MED ORDER — FLUTICASONE-SALMETEROL 250-50 MCG/DOSE IN AEPB: 1.0000 | INHALATION_SPRAY | Freq: Two times a day (BID) | RESPIRATORY_TRACT | 11 refills | Status: DC

## 2018-09-06 NOTE — Telephone Encounter (Signed)
Spoke to pt about the generic Advair. I have sent it in to the pharmacy for her.

## 2018-09-06 NOTE — Telephone Encounter (Signed)
Okay to try the generic advair 250/50--- 1 inhalation bid (1 year Rx) She generally hasn't used it regularly and wasn't using the albuterol that much, but given her recent exacerbation, should probably use this regularly if she can afford. It may be acceptable to just use 1 inhalation daily as preventative, if that is easier to afford

## 2018-10-18 DIAGNOSIS — H2513 Age-related nuclear cataract, bilateral: Secondary | ICD-10-CM | POA: Diagnosis not present

## 2019-01-19 IMAGING — MG DIGITAL DIAGNOSTIC UNILATERAL LEFT MAMMOGRAM WITH TOMO AND CAD
8 of 9 series · 8 of 17 positions shown · non-contrast
Comparison: Previous exam(s).

CLINICAL DATA: Follow-up of probably benign left breast 11 o'clock
nodules.

EXAM:
DIGITAL DIAGNOSTIC LEFT MAMMOGRAM WITH CAD AND TOMO
ULTRASOUND LEFT BREAST

[L MLO (1 of 2)]
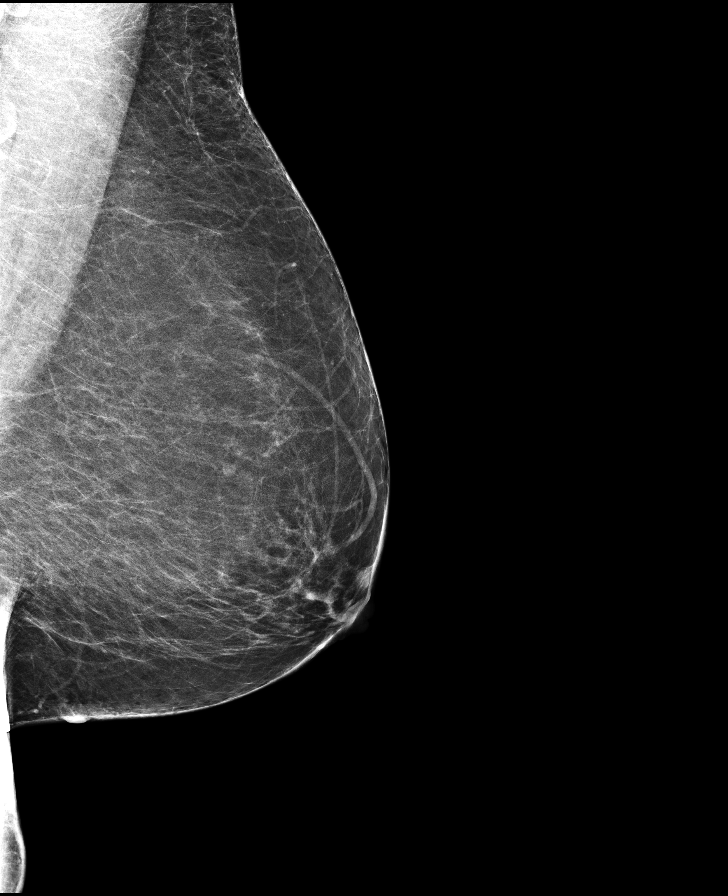

[L MLO synth-2D]
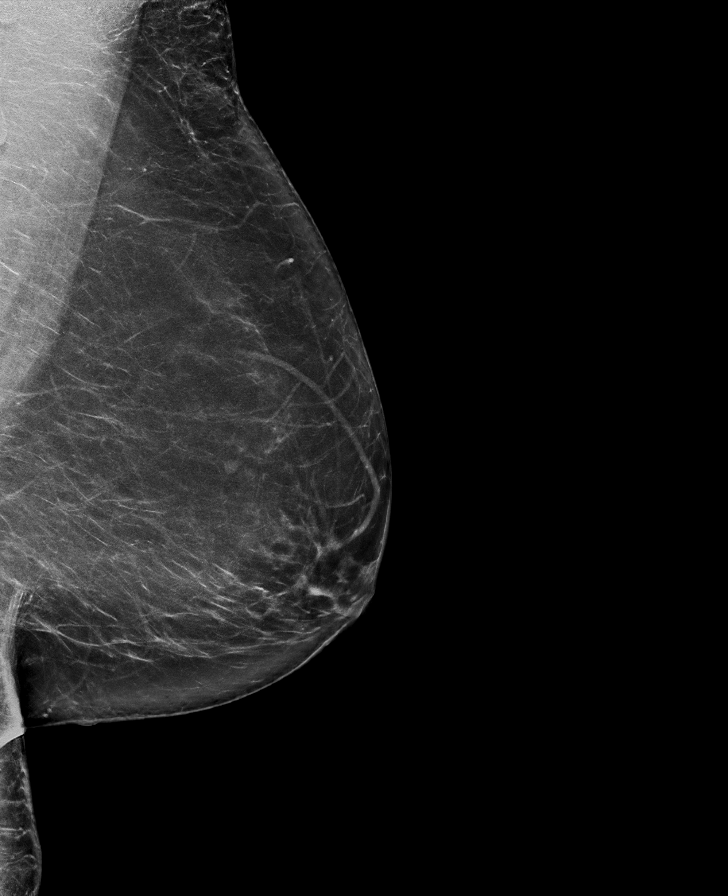

[L CC synth-2D]
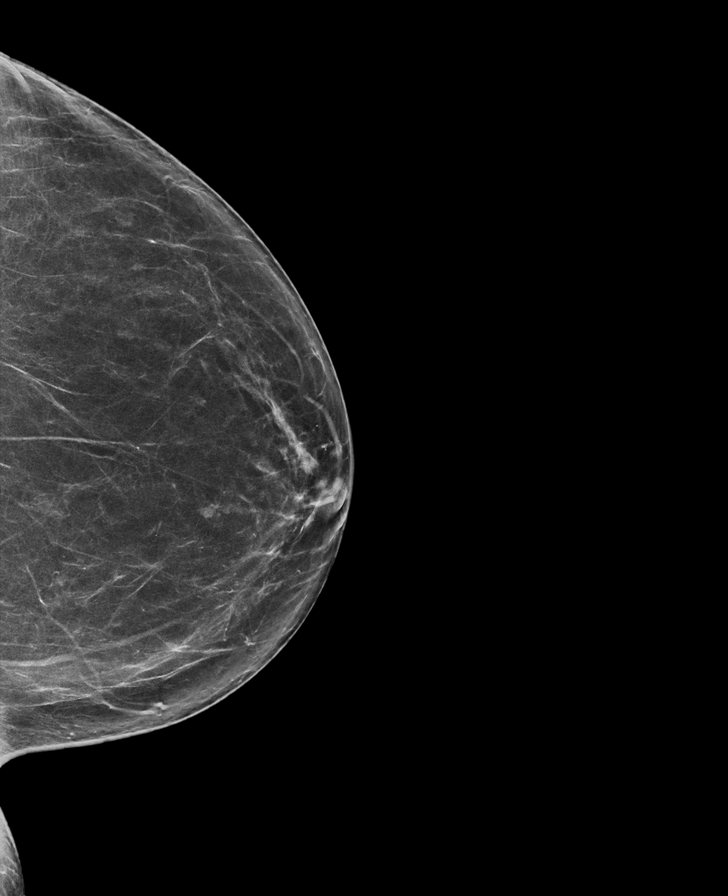

[L CC (1 of 3)]
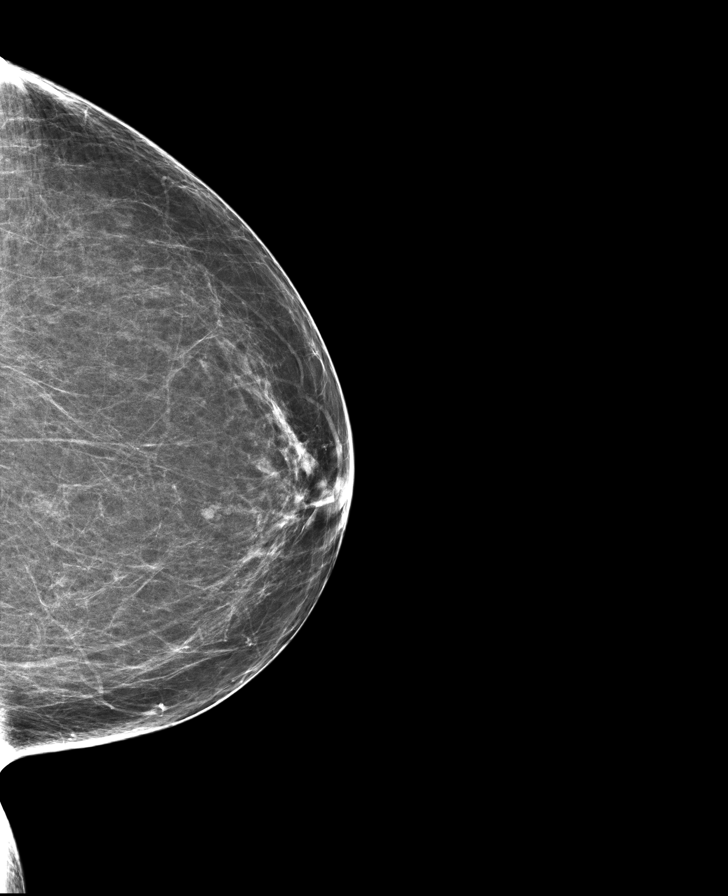

[L CC tomo · tomo slice 36/71.0]
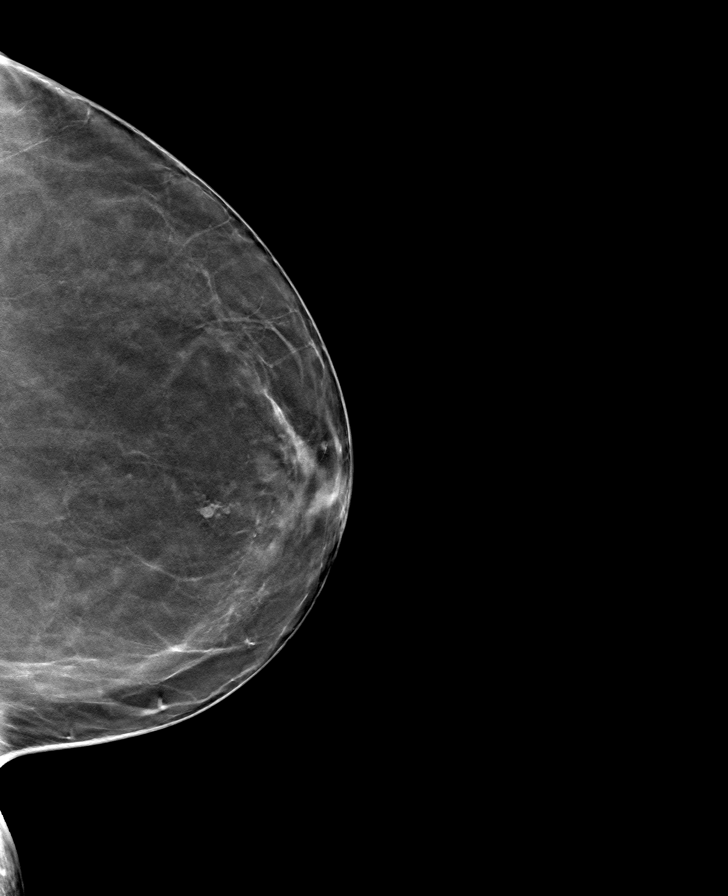

[L CC (2 of 3)]
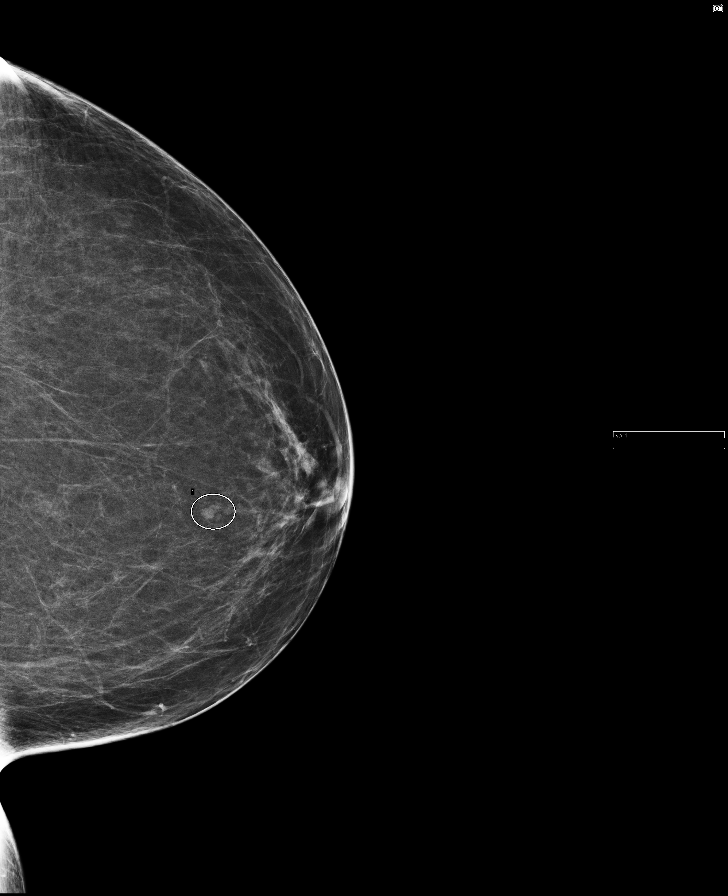

[L MLO (2 of 2)]
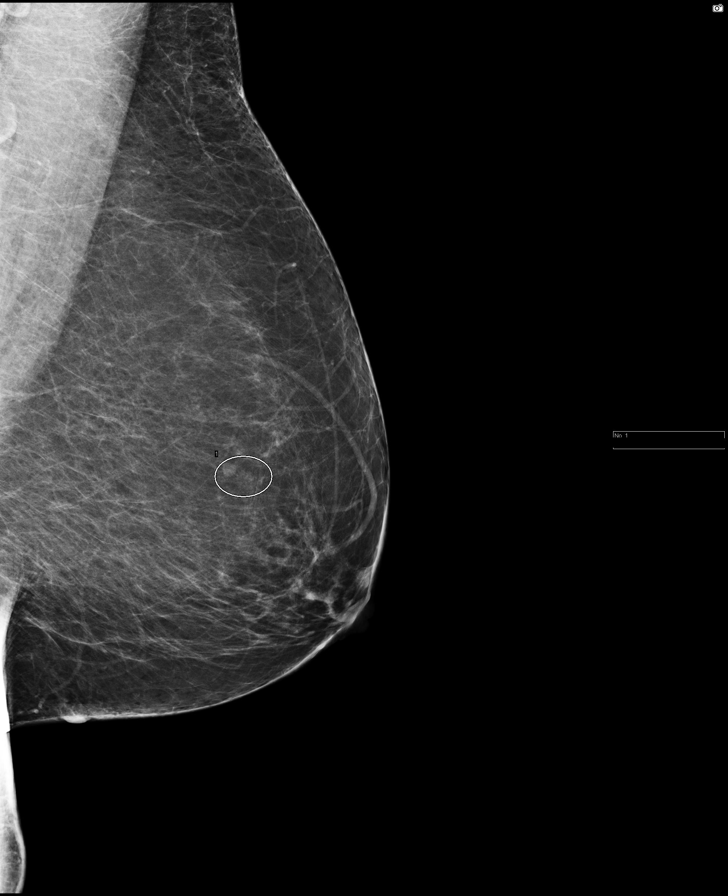

[L CC (3 of 3)]
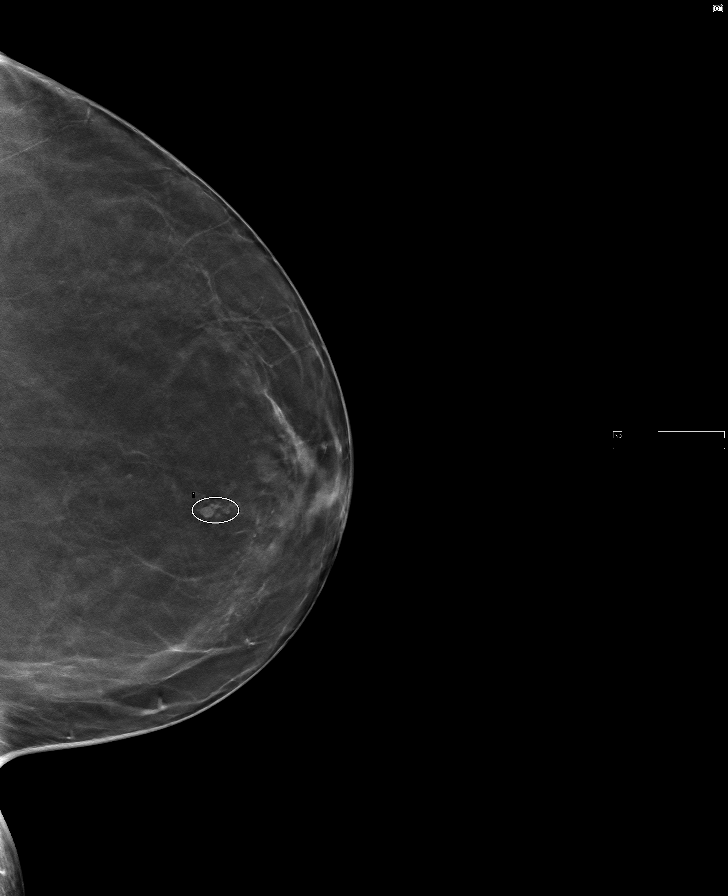

[8 of 17 positions shown; findings below may reference images not displayed]

ACR Breast Density Category b: There are scattered areas of
fibroglandular density.
FINDINGS: Mammographically, there are no new suspicious masses, areas of
architectural distortion or microcalcifications. There is a slightly
decreased in size cluster of circumscribed few mm nodules in the
left breast upper slightly inner quadrant, anterior depth.

Mammographic images were processed with CAD.

Targeted ultrasound is performed, showing left breast 11 o'clock 3
cm from the nipple 0.3 x 0.3 by 0.3 cm benign-appearing cyst. This
finding corresponds to the mammographically seen abnormality.
IMPRESSION: Decreased in prominence left breast 11 o'clock cluster of nodules,
likely representing a cluster of cysts/microcysts.

RECOMMENDATION:
Diagnostic mammogram and possibly ultrasound of the left breast in 6
months. (Code:[id number])

I have discussed the findings and recommendations with the patient.
Results were also provided in writing at the conclusion of the
visit. If applicable, a reminder letter will be sent to the patient
regarding the next appointment.

BI-RADS CATEGORY  3: Probably benign.

## 2019-02-28 ENCOUNTER — Telehealth: Payer: Self-pay

## 2019-02-28 NOTE — Telephone Encounter (Signed)
Antoinette pts daughter(DPR not signed) said that her moms logical thinking is off. [name] said last year her sister reached out to [place] and no one responded. See 02/01/18 phone note and 05/14/18 annual exam. I advised Antoinette I was sorry but I could not give any information since no DPR. Antoinette said pt thinks people are following her and watching pt in bathroom. No SI/HI. I offered appt with Dr Silvio Pate;Pt has no covid symptoms, no travel and no known exposure to + covid. Antoinette said she would call her sister and for me to hold on. I agreed but call was disconnected. I called Antoinette back and she sent sister a text but no response and [name] is going to call sister now. Antoinette scheduled appt with Dr Silvio Pate in office on 03/01/19 at 11;15. [name] will be bringing pt and antoinette has no covid symptoms, no travel and no known exposure to covid. ED precautions given. FYI to Dr Silvio Pate.

## 2019-02-28 NOTE — Telephone Encounter (Signed)
Okay---I will assess her at tomorrow's visit 

## 2019-03-01 ENCOUNTER — Encounter: Payer: Self-pay | Admitting: Internal Medicine

## 2019-03-01 ENCOUNTER — Ambulatory Visit (INDEPENDENT_AMBULATORY_CARE_PROVIDER_SITE_OTHER): Payer: Medicare Other | Admitting: Internal Medicine

## 2019-03-01 ENCOUNTER — Other Ambulatory Visit: Payer: Self-pay

## 2019-03-01 DIAGNOSIS — R4189 Other symptoms and signs involving cognitive functions and awareness: Secondary | ICD-10-CM | POA: Diagnosis not present

## 2019-03-01 LAB — COMPREHENSIVE METABOLIC PANEL
ALT: 12 U/L (ref 0–35)
AST: 21 U/L (ref 0–37)
Albumin: 4.2 g/dL (ref 3.5–5.2)
Alkaline Phosphatase: 65 U/L (ref 39–117)
BUN: 13 mg/dL (ref 6–23)
CO2: 30 mEq/L (ref 19–32)
Calcium: 9.5 mg/dL (ref 8.4–10.5)
Chloride: 106 mEq/L (ref 96–112)
Creatinine, Ser: 0.97 mg/dL (ref 0.40–1.20)
GFR: 68.67 mL/min (ref >60.00)
Glucose, Bld: 80 mg/dL (ref 70–99)
Potassium: 3.5 mEq/L (ref 3.5–5.1)
Sodium: 143 mEq/L (ref 135–145)
Total Bilirubin: 0.6 mg/dL (ref 0.2–1.2)
Total Protein: 7.1 g/dL (ref 6.0–8.3)

## 2019-03-01 LAB — CBC
HCT: 42.8 % (ref 36.0–46.0)
Hemoglobin: 14.1 g/dL (ref 12.0–15.0)
MCHC: 32.9 g/dL (ref 30.0–36.0)
MCV: 92 fl (ref 78.0–100.0)
Platelets: 263 10*3/uL (ref 150.0–400.0)
RBC: 4.65 Mil/uL (ref 3.87–5.11)
RDW: 14 % (ref 11.5–15.5)
WBC: 5.8 10*3/uL (ref 4.0–10.5)

## 2019-03-01 LAB — T4, FREE: Free T4: 0.88 ng/dL (ref 0.60–1.60)

## 2019-03-01 LAB — VITAMIN B12: Vitamin B-12: 257 pg/mL (ref 211–911)

## 2019-03-01 MED ORDER — ALBUTEROL SULFATE HFA 108 (90 BASE) MCG/ACT IN AERS: 2.0000 | INHALATION_SPRAY | Freq: Four times a day (QID) | RESPIRATORY_TRACT | 3 refills | Status: DC | PRN

## 2019-03-01 NOTE — Progress Notes (Signed)
Subjective:    Patient ID: Natalie [name], female    DOB: 1951/03/06, 68 y.o.   MRN: [id number]  HPI Here with daughter [name] "they say I forget stuff" Doesn't remember the color of her clothes, what coat she has Thinks her couch was not her couch---thought someone had "switched it out" Thinks someone is following her, peeping in her windows, etc Going on since last year  Lives alone Still drives Still shops and cooks  Current Outpatient Medications on File Prior to Visit  Medication Sig Dispense Refill  . albuterol (PROAIR HFA) 108 (90 Base) MCG/ACT inhaler Inhale 2 puffs into the lungs every 6 (six) hours as needed for wheezing or shortness of breath. 1 Inhaler 1  . cetirizine (ZYRTEC) 10 MG tablet Take 10 mg by mouth daily.      . Fluticasone-Salmeterol (ADVAIR) 250-50 MCG/DOSE AEPB Inhale 1 puff into the lungs 2 (two) times daily. 60 each 11  . ibuprofen (ADVIL,MOTRIN) 200 MG tablet Take 400 mg by mouth 2 (two) times daily.     No current facility-administered medications on file prior to visit.     No Known Allergies  Past Medical History:  Diagnosis Date  . Allergy   . Arthritis   . Asthma   . Cataract   . GERD (gastroesophageal reflux disease)    pt. denied  . Heart murmur    was told years ago  . History of shingles 2006  . Hyperlipidemia     Past Surgical History:  Procedure Laterality Date  . ABDOMINAL HYSTERECTOMY    . COLONOSCOPY    . POLYPECTOMY    . VAGINAL DELIVERY     x2    Family History  Problem Relation Age of Onset  . Arthritis Mother   . Diabetes Mother   . Hypertension Mother   . Liver cancer Mother   . Colon cancer Father 57  . Pancreatic cancer Brother   . Colon polyps Brother   . Breast cancer Daughter 53  . Heart disease Neg Hx   . Esophageal cancer Neg Hx   . Rectal cancer Neg Hx   . Stomach cancer Neg Hx     Social History   Socioeconomic History  . Marital status: Single    Spouse name: Not on file  .  Number of children: 2  . Years of education: Not on file  . Highest education level: Not on file  Occupational History  . 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 Financial resource strain: Not on file  . Food insecurity    Worry: Not on file    Inability: Not on file  . Transportation needs    Medical: Not on file    Non-medical: Not on file  Tobacco Use  . Smoking status: Never Smoker  . Smokeless tobacco: Never Used  Substance and Sexual Activity  . Alcohol use: No  . Drug use: No  . Sexual activity: Not on file  Lifestyle  . Physical activity    Days per week: Not on file    Minutes per session: Not on file  . Stress: Not on file  Relationships  . Social Herbalist on phone: Not on file    Gets together: Not on file    Attends religious service: Not on file    Active member of club or organization: Not on file    Attends meetings of clubs or  organizations: Not on file    Relationship status: Not on file  . Intimate partner violence    Fear of current or ex partner: Not on file    Emotionally abused: Not on file    Physically abused: Not on file    Forced sexual activity: Not on file  Other Topics Concern  . Not on file  Social History Narrative   No living will   Requests daughter [name] as health care POA   Not sure about resuscitation   Not sure about tube feeds   Review of Systems Appetite is okay Not losing weight Some sleep issues---not clearly daytime somnolence No chest pain No regular SOB--but doesn't like the mask    Objective:   Physical Exam  Constitutional: She is oriented to person, place, and time. She appears well-developed. No distress.  Neurological: She is alert and oriented to person, place, and time.  President-- "Dwaine Deter, Bush" Asked for words with d---became argumentative and only got 3 or 4            Assessment & Plan:

## 2019-03-01 NOTE — Assessment & Plan Note (Signed)
I am concerned for early frontotemporal dementia Will set up with neurology

## 2019-03-04 ENCOUNTER — Encounter (HOSPITAL_COMMUNITY): Payer: Self-pay

## 2019-03-04 ENCOUNTER — Emergency Department (HOSPITAL_COMMUNITY): Payer: Medicare Other

## 2019-03-04 ENCOUNTER — Emergency Department (HOSPITAL_COMMUNITY)
Admission: EM | Admit: 2019-03-04 | Discharge: 2019-03-08 | Disposition: A | Discharge status: Other Acute Inpatient Hospital | Payer: Medicare Other | Attending: Emergency Medicine | Admitting: Emergency Medicine

## 2019-03-04 ENCOUNTER — Telehealth: Payer: Self-pay | Admitting: *Deleted

## 2019-03-04 ENCOUNTER — Other Ambulatory Visit: Payer: Self-pay

## 2019-03-04 DIAGNOSIS — R4189 Other symptoms and signs involving cognitive functions and awareness: Secondary | ICD-10-CM | POA: Diagnosis present

## 2019-03-04 DIAGNOSIS — Z20828 Contact with and (suspected) exposure to other viral communicable diseases: Secondary | ICD-10-CM | POA: Insufficient documentation

## 2019-03-04 DIAGNOSIS — Z03818 Encounter for observation for suspected exposure to other biological agents ruled out: Secondary | ICD-10-CM | POA: Diagnosis not present

## 2019-03-04 DIAGNOSIS — F29 Unspecified psychosis not due to a substance or known physiological condition: Secondary | ICD-10-CM | POA: Diagnosis not present

## 2019-03-04 DIAGNOSIS — J45909 Unspecified asthma, uncomplicated: Secondary | ICD-10-CM | POA: Insufficient documentation

## 2019-03-04 DIAGNOSIS — Z79899 Other long term (current) drug therapy: Secondary | ICD-10-CM | POA: Insufficient documentation

## 2019-03-04 DIAGNOSIS — F600 Paranoid personality disorder: Secondary | ICD-10-CM | POA: Diagnosis not present

## 2019-03-04 DIAGNOSIS — R6510 Systemic inflammatory response syndrome (SIRS) of non-infectious origin without acute organ dysfunction: Secondary | ICD-10-CM | POA: Diagnosis not present

## 2019-03-04 DIAGNOSIS — R440 Auditory hallucinations: Secondary | ICD-10-CM | POA: Diagnosis not present

## 2019-03-04 DIAGNOSIS — F209 Schizophrenia, unspecified: Secondary | ICD-10-CM | POA: Diagnosis present

## 2019-03-04 LAB — COMPREHENSIVE METABOLIC PANEL
ALT: 17 U/L (ref 0–44)
AST: 35 U/L (ref 15–41)
Albumin: 4.4 g/dL (ref 3.5–5.0)
Alkaline Phosphatase: 69 U/L (ref 38–126)
Anion gap: 12 (ref 5–15)
BUN: 15 mg/dL (ref 8–23)
CO2: 24 mmol/L (ref 22–32)
Calcium: 9.5 mg/dL (ref 8.9–10.3)
Chloride: 104 mmol/L (ref 98–111)
Creatinine, Ser: 1.03 mg/dL — ABNORMAL HIGH (ref 0.44–1.00)
GFR calc Af Amer: >60 mL/min (ref >60)
GFR calc non Af Amer: 55 mL/min — ABNORMAL LOW (ref >60)
Glucose, Bld: 128 mg/dL — ABNORMAL HIGH (ref 70–99)
Potassium: 3.3 mmol/L — ABNORMAL LOW (ref 3.5–5.1)
Sodium: 140 mmol/L (ref 135–145)
Total Bilirubin: 0.6 mg/dL (ref 0.3–1.2)
Total Protein: 8.3 g/dL — ABNORMAL HIGH (ref 6.5–8.1)

## 2019-03-04 LAB — URINALYSIS, ROUTINE W REFLEX MICROSCOPIC
Bacteria, UA: NONE SEEN
Bilirubin Urine: NEGATIVE
Glucose, UA: NEGATIVE mg/dL
Ketones, ur: NEGATIVE mg/dL
Nitrite: NEGATIVE
Protein, ur: NEGATIVE mg/dL
Specific Gravity, Urine: 1.026 (ref 1.005–1.030)
pH: 5 (ref 5.0–8.0)

## 2019-03-04 LAB — CBC
HCT: 45.2 % (ref 36.0–46.0)
Hemoglobin: 14.6 g/dL (ref 12.0–15.0)
MCH: 29.9 pg (ref 26.0–34.0)
MCHC: 32.3 g/dL (ref 30.0–36.0)
MCV: 92.4 fL (ref 80.0–100.0)
Platelets: 289 10*3/uL (ref 150–400)
RBC: 4.89 MIL/uL (ref 3.87–5.11)
RDW: 13.6 % (ref 11.5–15.5)
WBC: 8.3 10*3/uL (ref 4.0–10.5)
nRBC: 0 % (ref 0.0–0.2)

## 2019-03-04 LAB — ETHANOL: Alcohol, Ethyl (B): <10 mg/dL (ref <10)

## 2019-03-04 LAB — RAPID URINE DRUG SCREEN, HOSP PERFORMED
Amphetamines: NOT DETECTED
Barbiturates: NOT DETECTED
Benzodiazepines: NOT DETECTED
Cocaine: NOT DETECTED
Opiates: NOT DETECTED
Tetrahydrocannabinol: NOT DETECTED

## 2019-03-04 LAB — CBG MONITORING, ED: Glucose-Capillary: 156 mg/dL — ABNORMAL HIGH (ref 70–99)

## 2019-03-04 LAB — SARS CORONAVIRUS 2 BY RT PCR (HOSPITAL ORDER, PERFORMED IN ~~LOC~~ HOSPITAL LAB): SARS Coronavirus 2: NEGATIVE

## 2019-03-04 LAB — TSH: TSH: 1.448 u[IU]/mL (ref 0.350–4.500)

## 2019-03-04 MED ORDER — LORATADINE 10 MG PO TABS
10.0000 mg | ORAL_TABLET | Freq: Every day | ORAL | Status: DC
  Administered 2019-03-04 – 2019-03-06 (×2): 10 mg via ORAL
  Filled 2019-03-04 (×4): qty 1

## 2019-03-04 MED ORDER — SODIUM CHLORIDE 0.9% FLUSH: 3.0000 mL | Freq: Once | INTRAVENOUS | Status: DC

## 2019-03-04 MED ORDER — MOMETASONE FURO-FORMOTEROL FUM 200-5 MCG/ACT IN AERO
2.0000 | INHALATION_SPRAY | Freq: Two times a day (BID) | RESPIRATORY_TRACT | Status: DC
  Administered 2019-03-06: 2 via RESPIRATORY_TRACT
  Filled 2019-03-04 (×2): qty 8.8

## 2019-03-04 MED ORDER — POTASSIUM CHLORIDE CRYS ER 20 MEQ PO TBCR
40.0000 meq | EXTENDED_RELEASE_TABLET | Freq: Once | ORAL | Status: AC
  Administered 2019-03-04: 21:00:00 40 meq via ORAL
  Filled 2019-03-04: qty 2

## 2019-03-04 MED ORDER — ALBUTEROL SULFATE HFA 108 (90 BASE) MCG/ACT IN AERS
2.0000 | INHALATION_SPRAY | Freq: Four times a day (QID) | RESPIRATORY_TRACT | Status: DC | PRN
  Administered 2019-03-04: 2 via RESPIRATORY_TRACT
  Filled 2019-03-04 (×2): qty 6.7

## 2019-03-04 NOTE — ED Notes (Signed)
Spoke to lab. Need another dark green for Ethanol.

## 2019-03-04 NOTE — ED Notes (Addendum)
Pt said, "Can you sit that computer in the hallway. There isn't suppose to be one in my room. They can get in that way." I asked the pt what she had been doing today, she said, "Riding around with my brother and eating."

## 2019-03-04 NOTE — ED Notes (Signed)
Patient has urine in traige

## 2019-03-04 NOTE — ED Notes (Signed)
Sam from [facility] called, said pt meets criteria for geriatric inpatient. They are looking for a location to place her. Contact her daughter, Ryin Schillo [phone], with updates. [name] picked up her belongings earlier today.

## 2019-03-04 NOTE — Telephone Encounter (Signed)
Patient's daughter [name] notified as instructed by telephone and verbalized understanding. [name] stated that she is a Marine scientist and plans on taking her to the ER shortly. [name] stated that her mom found her gun last night and it was taken away from her. [name] stated that after she finishes with one clinic she will be picking her mom up and taking her to the ER and plans on having her committed for at least 72 hours.

## 2019-03-04 NOTE — ED Notes (Signed)
Patient has extra blood in the main lab one gold top and one red top
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
## 2019-03-04 NOTE — ED Notes (Addendum)
Gave pt ham sandwich and water.

## 2019-03-04 NOTE — ED Notes (Signed)
TTS being done via telepsych at this time.

## 2019-03-04 NOTE — Telephone Encounter (Signed)
Patient's daughter Natalie [name] left a voicemail stating that her mom was seen last week and her sister brought her in. Natalie [name] stated that her mom is not okay and the family is real concerned about her. Natalie [name] stated that they think that she may be bipolar or manic. Natalie [name] thinks that she needs to be committed or at least evaluated.  Natalie [name] stated that her mom is very paranoid and is telling the family that someone is watching her and that homeland security is monitorying her. Natalie [name] stated that she is concerned that her mom may hurt herself or someone else and wants recommendation as to what the family should do to get her mom the help that she needs?

## 2019-03-04 NOTE — BH Assessment (Addendum)
Pt provided clinician verbal consent to contact her daughter for collateral. Clinician called pt's daughter at [phone] but there was no answer; clinician left a HIPPA-compliant message on pt's daughter's voicemail requesting she return clinician's phone call.   [place]ra Reliford, daughter: [phone]   Addendum: Pt's daughter returned clinician's phone call at [phone]; the information gathered in the phone call can be found in pt's [facility] Assessment dated 7/27 and time-stamped 8:42PM.

## 2019-03-04 NOTE — Telephone Encounter (Signed)
Given concern for risk that she would harm herself or someone else would recommend that she go to the ER for psych evaluation.   Natalie [name]
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
## 2019-03-04 NOTE — ED Notes (Signed)
Holding Dulera inhaler because pt is asleep and snoring. Daughter told [place] that pt hasn't slept in several days and she needs sleep.
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
## 2019-03-04 NOTE — ED Notes (Signed)
Spoke to [employer] health OBS unit. Will not take anyone above 65.

## 2019-03-05 ENCOUNTER — Encounter (HOSPITAL_COMMUNITY): Payer: Self-pay | Admitting: Registered Nurse

## 2019-03-05 DIAGNOSIS — F209 Schizophrenia, unspecified: Secondary | ICD-10-CM | POA: Diagnosis present

## 2019-03-05 DIAGNOSIS — F29 Unspecified psychosis not due to a substance or known physiological condition: Secondary | ICD-10-CM | POA: Diagnosis present

## 2019-03-05 MED ORDER — MOMETASONE FURO-FORMOTEROL FUM 200-5 MCG/ACT IN AERO: 2.0000 | INHALATION_SPRAY | Freq: Two times a day (BID) | RESPIRATORY_TRACT | 0 refills | Status: DC

## 2019-03-05 MED ORDER — SODIUM CHLORIDE 0.9 % IV BOLUS
1000.0000 mL | Freq: Once | INTRAVENOUS | Status: AC
  Administered 2019-03-06: 1000 mL via INTRAVENOUS
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
## 2019-03-05 NOTE — ED Notes (Signed)
Pt awake, alert & responsive, no distress noted, calm & cooperative at present.  Sitter at bedside. Monitoring for safety.

## 2019-03-05 NOTE — ED Notes (Signed)
Charge Nurse Bobby at bedside to obtain bloodwork on pt.  Saline lock started by [employer].  Pt pulled out saline lock,

## 2019-03-05 NOTE — ED Notes (Signed)
Pt caught several times walking around emergency department after being escorted back to her room.

## 2019-03-05 NOTE — ED Notes (Signed)
Pt given lunch tray, but pt did not eat it stating she will eat something later.

## 2019-03-05 NOTE — ED Notes (Signed)
Pt in room resting. Pt HR elevated (see chart). Per MD pt is not to be transported to [place] at this time.  Will continue to be monitored.
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
## 2019-03-05 NOTE — ED Notes (Signed)
Assisted pt back to room. She said she would sit on the bed.
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
## 2019-03-05 NOTE — ED Notes (Signed)
Pt to room 29. Oriented to unit. Pt daughter came to visit for about 20 minutes. Pt has been calm, quiet, guarded.

## 2019-03-05 NOTE — Discharge Summary (Addendum)
Patient to be transferred to [facility] for inpatient psychiatric treatment.  Patient's chart reviewed. Reviewed the information documented and agree with the treatment plan.  Buford Dresser, DO 03/05/19 5:17 PM

## 2019-03-05 NOTE — ED Notes (Signed)
Patient has been changed out into scrubs and sitter placed at bedside

## 2019-03-05 NOTE — ED Notes (Signed)
Pt said she does not need her Dulera inhaler now

## 2019-03-06 LAB — COMPREHENSIVE METABOLIC PANEL
ALT: 19 U/L (ref 0–44)
AST: 33 U/L (ref 15–41)
Albumin: 4.2 g/dL (ref 3.5–5.0)
Alkaline Phosphatase: 63 U/L (ref 38–126)
Anion gap: 10 (ref 5–15)
BUN: 10 mg/dL (ref 8–23)
CO2: 28 mmol/L (ref 22–32)
Calcium: 9.3 mg/dL (ref 8.9–10.3)
Chloride: 101 mmol/L (ref 98–111)
Creatinine, Ser: 0.77 mg/dL (ref 0.44–1.00)
GFR calc Af Amer: >60 mL/min (ref >60)
GFR calc non Af Amer: >60 mL/min (ref >60)
Glucose, Bld: 122 mg/dL — ABNORMAL HIGH (ref 70–99)
Potassium: 3.2 mmol/L — ABNORMAL LOW (ref 3.5–5.1)
Sodium: 139 mmol/L (ref 135–145)
Total Bilirubin: 0.8 mg/dL (ref 0.3–1.2)
Total Protein: 8.2 g/dL — ABNORMAL HIGH (ref 6.5–8.1)

## 2019-03-06 LAB — URINALYSIS, ROUTINE W REFLEX MICROSCOPIC
Bilirubin Urine: NEGATIVE
Glucose, UA: NEGATIVE mg/dL
Hgb urine dipstick: NEGATIVE
Ketones, ur: NEGATIVE mg/dL
Leukocytes,Ua: NEGATIVE
Nitrite: NEGATIVE
Protein, ur: NEGATIVE mg/dL
Specific Gravity, Urine: 1.004 — ABNORMAL LOW (ref 1.005–1.030)
pH: 7 (ref 5.0–8.0)

## 2019-03-06 LAB — CBC WITH DIFFERENTIAL/PLATELET
Abs Immature Granulocytes: 0.03 10*3/uL (ref 0.00–0.07)
Basophils Absolute: 0 10*3/uL (ref 0.0–0.1)
Basophils Relative: 1 %
Eosinophils Absolute: 0 10*3/uL (ref 0.0–0.5)
Eosinophils Relative: 0 %
HCT: 47 % — ABNORMAL HIGH (ref 36.0–46.0)
Hemoglobin: 14.8 g/dL (ref 12.0–15.0)
Immature Granulocytes: 1 %
Lymphocytes Relative: 39 %
Lymphs Abs: 2.5 10*3/uL (ref 0.7–4.0)
MCH: 29.5 pg (ref 26.0–34.0)
MCHC: 31.5 g/dL (ref 30.0–36.0)
MCV: 93.6 fL (ref 80.0–100.0)
Monocytes Absolute: 0.5 10*3/uL (ref 0.1–1.0)
Monocytes Relative: 8 %
Neutro Abs: 3.4 10*3/uL (ref 1.7–7.7)
Neutrophils Relative %: 51 %
Platelets: 265 10*3/uL (ref 150–400)
RBC: 5.02 MIL/uL (ref 3.87–5.11)
RDW: 13.5 % (ref 11.5–15.5)
WBC: 6.5 10*3/uL (ref 4.0–10.5)
nRBC: 0 % (ref 0.0–0.2)

## 2019-03-06 LAB — LACTIC ACID, PLASMA
Lactic Acid, Venous: 1.1 mmol/L (ref 0.5–1.9)
Lactic Acid, Venous: 2 mmol/L (ref 0.5–1.9)
Lactic Acid, Venous: 2.8 mmol/L (ref 0.5–1.9)

## 2019-03-06 MED ORDER — VANCOMYCIN HCL IN DEXTROSE 1-5 GM/200ML-% IV SOLN
1000.0000 mg | Freq: Once | INTRAVENOUS | Status: AC
  Administered 2019-03-06: 02:00:00 1000 mg via INTRAVENOUS
  Filled 2019-03-06: qty 200

## 2019-03-06 MED ORDER — PIPERACILLIN-TAZOBACTAM 3.375 G IVPB 30 MIN
3.3750 g | Freq: Once | INTRAVENOUS | Status: AC
  Administered 2019-03-06: 3.375 g via INTRAVENOUS
  Filled 2019-03-06: qty 50

## 2019-03-06 MED ORDER — SODIUM CHLORIDE 0.9 % IV SOLN
INTRAVENOUS | Status: DC
  Administered 2019-03-06: 125 mL/h via INTRAVENOUS

## 2019-03-06 MED ORDER — LORAZEPAM 1 MG PO TABS
1.0000 mg | ORAL_TABLET | Freq: Four times a day (QID) | ORAL | Status: DC | PRN
  Filled 2019-03-06: qty 1

## 2019-03-06 MED ORDER — ACETAMINOPHEN 500 MG PO TABS: 1000.0000 mg | ORAL_TABLET | Freq: Four times a day (QID) | ORAL | Status: DC | PRN

## 2019-03-06 NOTE — ED Notes (Signed)
Showered a short while ago and she fixed her own hair. She is pacing her room, sits for only brief amounts of time, seconds and resumes pacing and standing in her doorway.

## 2019-03-06 NOTE — ED Notes (Signed)
Offered her [facility] Inhaler which is due at this time, She said to lay it on the table, her throat was clogging at this time. Writer informed her that I needed to see her use it, I couldn't leave it in her room. Said then"go ahead and take it on out of here then. "

## 2019-03-06 NOTE — ED Notes (Signed)
This Probation officer was able to retreive pt's inhaler.

## 2019-03-06 NOTE — ED Notes (Signed)
Date and time results received: 03/06/19 12:12 AM (use smartphrase ".now" to insert current time)  Test: Lactic acid Critical Value: 2.8  Name of Provider Notified: ward  Orders Received? Or Actions Taken?:

## 2019-03-06 NOTE — Progress Notes (Signed)
CSW received a call from [place] at [place]. She stated that regardless of when pt's blood culture results come back, the earliest pt will be able to be transferred to [facility] is tomorrow.   Audree Camel, LCSW, [place] [facility] [phone]

## 2019-03-06 NOTE — BH Assessment (Signed)
[facility] Assessment Progress Note  Transfer to [facility] has been postponed due to concerns regarding medical clearance.  Blood culture, repeat blood culture, and urine culture have been ordered and drawn with results pending as of this writing.  When results come in, please fax them to [phone] and follow up with a call to [phone].  Jalene Mullet, [place] Coordinator [phone]

## 2019-03-06 NOTE — ED Notes (Signed)
[facility] REQUESTED.

## 2019-03-06 NOTE — ED Notes (Signed)
Report called to Kristy RN

## 2019-03-06 NOTE — ED Notes (Signed)
Pt took her inhaler from this writer, did not use it because, "I use it when I want to"  She would not return it to this Probation officer.  She has another inhaler in her possession as well.

## 2019-03-06 NOTE — ED Notes (Signed)
Sitting eating dinner, complains of back of her head hurting and not feeling right but declined anything for pain and is vague as to pain and issue. She is confused and disorganized, cant organize to eat her dinner. Dinner tray untouched since it arrived about 1730 made her soup and she ate her salad. Difficulty opening her salad container but wouldn't accept help. Asked when she was leaving here and when writer attempted explanation, she was unable to understand it, lost interest in it, and moved on to eating. Sitter at her doorway for safety.

## 2019-03-07 DIAGNOSIS — F600 Paranoid personality disorder: Secondary | ICD-10-CM

## 2019-03-07 DIAGNOSIS — R440 Auditory hallucinations: Secondary | ICD-10-CM

## 2019-03-07 DIAGNOSIS — F29 Unspecified psychosis not due to a substance or known physiological condition: Secondary | ICD-10-CM

## 2019-03-07 LAB — URINE CULTURE: Culture: <10000 — AB

## 2019-03-07 LAB — SARS CORONAVIRUS 2 BY RT PCR (HOSPITAL ORDER, PERFORMED IN ~~LOC~~ HOSPITAL LAB): SARS Coronavirus 2: NEGATIVE

## 2019-03-07 MED ORDER — HYDRALAZINE HCL 50 MG PO TABS
50.0000 mg | ORAL_TABLET | Freq: Once | ORAL | Status: DC
  Filled 2019-03-07: qty 1

## 2019-03-07 MED ORDER — STERILE WATER FOR INJECTION IJ SOLN
INTRAMUSCULAR | Status: AC
  Filled 2019-03-07: qty 10

## 2019-03-07 MED ORDER — HYDRALAZINE HCL 20 MG/ML IJ SOLN
10.0000 mg | Freq: Once | INTRAMUSCULAR | Status: AC
  Administered 2019-03-07: 10 mg via INTRAMUSCULAR
  Filled 2019-03-07: qty 0.5

## 2019-03-07 MED ORDER — OLANZAPINE 5 MG PO TBDP
5.0000 mg | ORAL_TABLET | Freq: Every day | ORAL | Status: DC
  Administered 2019-03-08: 5 mg via ORAL
  Filled 2019-03-07: qty 1

## 2019-03-07 MED ORDER — OLANZAPINE 10 MG IM SOLR
5.0000 mg | Freq: Every day | INTRAMUSCULAR | Status: DC
  Administered 2019-03-07: 14:00:00 5 mg via INTRAMUSCULAR
  Filled 2019-03-07: qty 10

## 2019-03-07 NOTE — ED Notes (Signed)
Pt refused her medication.  She said "You can just type it in that computer of your that I refuse"  She is very paranoid and uninterested.

## 2019-03-07 NOTE — ED Notes (Signed)
Unaware of her sleeping even 10 minutes this shift. At times looked like she was going to fall asleep in the recliner in her room but did not. Went to her bed three times but didn't lie down for more than 15 min and never saw her sleep. Fought against sleeping but tech that has been on unit since 1500 said she slept most of the day shift.

## 2019-03-07 NOTE — Consult Note (Addendum)
Telepsych Consultation   Reason for Consult: Psychosis Referring Physician:  EDP Location of Patient:  [facility] Location of Provider: [facility]  Patient Identification: Natalie [name] MRN:  [id number] Principal Diagnosis: Psychosis [facility]) Diagnosis:  Principal Problem:   Psychosis ([place]) Active Problems:   Cognitive changes   Total Time spent with patient: 20 minutes  Subjective:   Natalie [name] is a 68 y.o. female patient admitted with psychosis.  HPI:  Natalie [name], 68 y.o., female patient seen via telepsych by this provider; chart reviewed and consulted with Dr. Mariea Clonts on 03/07/19.  On evaluation Pt stated she is ready to go home. She is sitting in the chair and combing her hair. She is smiling and cooperative with the assessment. She stated she did not eat because she doesn't trust the food. She stated she thinks they might put something in her food to make her sleep. She did state she slept all day yesterday because she was so "doped up." Pt's daughter reported she has not slept in several days.  Patient's daughter previously stated she has had episodes of paranoia on/off for years.  Patient denies illicit drug/alcohol use, her UDS and BAL are negative..  Patient states that she lives alone but her daughter lives up the road. Pt is recommended for an inpatient gero psych bed for medication management and stabilization.   Past Psychiatric History: No  Risk to Self: Suicidal Ideation: No Suicidal Intent: No Is patient at risk for suicide?: No Suicidal Plan?: No Access to Means: No What has been your use of drugs/alcohol within the last 12 months?: Pt denies SA How many times?: 0 Other Self Harm Risks: Pt has been experiencing AH and having difficulties with ADLS Triggers for Past Attempts: None known Intentional Self Injurious Behavior: None Risk to Others: Homicidal Ideation: No Thoughts of Harm to Others: No Current Homicidal Intent: No Current  Homicidal Plan: No Access to Homicidal Means: No Identified Victim: None noted History of harm to others?: No Assessment of Violence: On admission Violent Behavior Description: Pt had a gun, stated she was protecting her family w/ it Does patient have access to weapons?: No(Pt had a gun; her daughter took it from her) Criminal Charges Pending?: No Does patient have a court date: No Prior Inpatient Therapy: Prior Inpatient Therapy: No Prior Outpatient Therapy: Prior Outpatient Therapy: No Does patient have an ACCT team?: No Does patient have Intensive In-House Services?  : No Does patient have [facility] services? : No Does patient have P4CC services?: No  Past Medical History:  Past Medical History:  Diagnosis Date  . Allergy   . Arthritis   . Asthma   . Cataract   . GERD (gastroesophageal reflux disease)    pt. denied  . Heart murmur    was told years ago  . History of shingles 2006  . Hyperlipidemia     Past Surgical History:  Procedure Laterality Date  . ABDOMINAL HYSTERECTOMY    . COLONOSCOPY    . POLYPECTOMY    . VAGINAL DELIVERY     x2   Family History:  Family History  Problem Relation Age of Onset  . Arthritis Mother   . Diabetes Mother   . Hypertension Mother   . Liver cancer Mother   . Colon cancer Father 70  . Pancreatic cancer Brother   . Colon polyps Brother   . Breast cancer Daughter 77  . Heart disease Neg Hx   . Esophageal cancer Neg Hx   .  Rectal cancer Neg Hx   . Stomach cancer Neg Hx    Family Psychiatric  History: None per chart review.     Social History:  Social History   Substance and Sexual Activity  Alcohol Use No     Social History   Substance and Sexual Activity  Drug Use No    Social History   Socioeconomic History  . Marital status: Single    Spouse name: Not on file  . Number of children: 2  . Years of education: Not on file  . Highest education level: Not on file  Occupational History  . 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 Financial resource strain: Not on file  . Food insecurity    Worry: Not on file    Inability: Not on file  . Transportation needs    Medical: Not on file    Non-medical: Not on file  Tobacco Use  . Smoking status: Never Smoker  . Smokeless tobacco: Never Used  Substance and Sexual Activity  . Alcohol use: No  . Drug use: No  . Sexual activity: Not on file  Lifestyle  . Physical activity    Days per week: Not on file    Minutes per session: Not on file  . Stress: Not on file  Relationships  . Social Herbalist on phone: Not on file    Gets together: Not on file    Attends religious service: Not on file    Active member of club or organization: Not on file    Attends meetings of clubs or organizations: Not on file    Relationship status: Not on file  Other Topics Concern  . Not on file  Social History Narrative   No living will   Requests daughter [name] as health care POA   Not sure about resuscitation   Not sure about tube feeds   Additional Social History: N/A    Allergies:  No Known Allergies  Labs:  Results for orders placed or performed during the hospital encounter of 03/04/19 (from the past 48 hour(s))  Blood culture (routine x 2)     Status: None (Preliminary result)   Collection Time: 03/05/19  9:44 PM   Specimen: BLOOD  Result Value Ref Range   Specimen Description      BLOOD LEFT ANTECUBITAL Performed at [facility], [place] [address], [place], [place] [address]    Special Requests      BOTTLES DRAWN AEROBIC AND ANAEROBIC Blood Culture adequate volume Performed at [facility] [address], [place], [place] [address]    Culture      NO GROWTH 1 DAY Performed at [facility] [address], [place],  [address]    Report Status PENDING   Urinalysis, Routine w reflex microscopic     Status: Abnormal   Collection  Time: 03/05/19  9:44 PM  Result Value Ref Range   Color, Urine STRAW (A) YELLOW   APPearance CLEAR CLEAR   Specific Gravity, Urine 1.004 (L) 1.005 - 1.030   pH 7.0 5.0 - 8.0   Glucose, UA NEGATIVE NEGATIVE mg/dL   Hgb urine dipstick NEGATIVE NEGATIVE   Bilirubin Urine NEGATIVE NEGATIVE   Ketones, ur NEGATIVE NEGATIVE mg/dL   Protein, ur NEGATIVE NEGATIVE mg/dL   Nitrite NEGATIVE NEGATIVE   Leukocytes,Ua NEGATIVE NEGATIVE    Comment: Performed at [facility], [place] [address], [place], [place] [address]  Urine culture     Status: Abnormal   Collection Time: 03/05/19  9:44 PM   Specimen: Urine, Clean Catch  Result Value Ref Range   Specimen Description      URINE, CLEAN CATCH Performed at [facility], [place] [address], [place], [place] [address]    Special Requests      NONE Performed at [facility], [place] [address] [place], [place] [address]    Culture (A)     <10,000 COLONIES/mL INSIGNIFICANT GROWTH Performed at [facility] [address] [place], [place] [address]    Report Status 03/07/2019 FINAL   Lactic acid, plasma     Status: Abnormal   Collection Time: 03/05/19 11:05 PM  Result Value Ref Range   Lactic Acid, Venous 2.8 (HH) 0.5 - 1.9 mmol/L    Comment: CRITICAL RESULT CALLED TO, READ BACK BY AND VERIFIED WITH: RN H LACIVITA AT 0010 03/06/19 CRUICKSHANK A Performed at [facility] [address] [place], [place] [address]   Blood culture (routine x 2)     Status: None (Preliminary result)   Collection Time: 03/05/19 11:53 PM   Specimen: BLOOD  Result Value Ref Range   Specimen Description      BLOOD RIGHT ANTECUBITAL Performed at [facility] [address], [place], [place] [address]    Special Requests      BOTTLES DRAWN AEROBIC ONLY Blood Culture adequate volume Performed at [facility] [address], [place], [place] [address]     Culture      NO GROWTH 1 DAY Performed at [facility] [address], [place], [place] [address]    Report Status PENDING   CBC with Differential     Status: Abnormal   Collection Time: 03/05/19 11:53 PM  Result Value Ref Range   WBC 6.5 4.0 - 10.5 K/uL   RBC 5.02 3.87 - 5.11 MIL/uL   Hemoglobin 14.8 12.0 - 15.0 g/dL   HCT 47.0 (H) 36.0 - 46.0 %   MCV 93.6 80.0 - 100.0 fL   MCH 29.5 26.0 - 34.0 pg   MCHC 31.5 30.0 - 36.0 g/dL   RDW 13.5 11.5 - 15.5 %   Platelets 265 150 - 400 K/uL   nRBC 0.0 0.0 - 0.2 %   Neutrophils Relative % 51 %   Neutro Abs 3.4 1.7 - 7.7 K/uL   Lymphocytes Relative 39 %   Lymphs Abs 2.5 0.7 - 4.0 K/uL   Monocytes Relative 8 %   Monocytes Absolute 0.5 0.1 - 1.0 K/uL   Eosinophils Relative 0 %   Eosinophils Absolute 0.0 0.0 - 0.5 K/uL   Basophils Relative 1 %   Basophils Absolute 0.0 0.0 - 0.1 K/uL   Immature Granulocytes 1 %   Abs Immature Granulocytes 0.03 0.00 - 0.07 K/uL    Comment: Performed at [facility], [place] [address] [place],  [address]  Comprehensive metabolic panel     Status: Abnormal   Collection Time: 03/05/19 11:53 PM  Result Value Ref Range   Sodium 139 135 - 145 mmol/L   Potassium 3.2 (L) 3.5 - 5.1 mmol/L   Chloride 101 98 - 111 mmol/L   CO2 28 22 - 32 mmol/L   Glucose, Bld 122 (H) 70 - 99 mg/dL   BUN 10 8 - 23 mg/dL   Creatinine, Ser 0.77 0.44 -  1.00 mg/dL   Calcium 9.3 8.9 - 10.3 mg/dL   Total Protein 8.2 (H) 6.5 - 8.1 g/dL   Albumin 4.2 3.5 - 5.0 g/dL   AST 33 15 - 41 U/L   ALT 19 0 - 44 U/L   Alkaline Phosphatase 63 38 - 126 U/L   Total Bilirubin 0.8 0.3 - 1.2 mg/dL   GFR calc non Af Amer >60 >60 mL/min   GFR calc Af Amer >60 >60 mL/min   Anion gap 10 5 - 15    Comment: Performed at [facility], [place] [address] [place], [place] [address]  Lactic acid, plasma     Status: Abnormal   Collection Time: 03/05/19 11:53 PM  Result Value Ref Range   Lactic Acid, Venous 2.0  (HH) 0.5 - 1.9 mmol/L    Comment: CRITICAL RESULT CALLED TO, READ BACK BY AND VERIFIED WITH: L[place]ON,L @ 1194 ON [id number] BY POTEAT,S Performed at [facility] [address], [place], [place] [address]   Lactic acid, plasma     Status: None   Collection Time: 03/06/19  2:25 AM  Result Value Ref Range   Lactic Acid, Venous 1.1 0.5 - 1.9 mmol/L    Comment: Performed at [facility], [place] [address] [place], [place] [address]    Medications:  Current Facility-Administered Medications  Medication Dose Route Frequency Provider Last Rate Last Dose  . acetaminophen (TYLENOL) tablet 1,000 mg  1,000 mg Oral Q6H PRN Ward, Kristen N, DO      . albuterol (VENTOLIN HFA) 108 (90 Base) MCG/ACT inhaler 2 puff  2 puff Inhalation Q6H PRN Davonna Belling, MD   2 puff at 03/04/19 2123  . hydrALAZINE (APRESOLINE) tablet 50 mg  50 mg Oral Once Maudie Flakes, MD      . loratadine (CLARITIN) tablet 10 mg  10 mg Oral Daily Davonna Belling, MD   10 mg at 03/06/19 0951  . LORazepam (ATIVAN) tablet 1 mg  1 mg Oral Q6H PRN Ripley Fraise, MD      . mometasone-formoterol [facility]) 200-5 MCG/ACT inhaler 2 puff  2 puff Inhalation BID Davonna Belling, MD   2 puff at 03/06/19 0809  . sodium chloride flush (NS) 0.9 % injection 3 mL  3 mL Intravenous Once Davonna Belling, MD       Current Outpatient Medications  Medication Sig Dispense Refill  . cetirizine (ZYRTEC) 10 MG tablet Take 10 mg by mouth daily.      Marland Kitchen albuterol (PROAIR HFA) 108 (90 Base) MCG/ACT inhaler Inhale 2 puffs into the lungs every 6 (six) hours as needed for wheezing or shortness of breath. 6.7 g 3  . mometasone-formoterol (DULERA) 200-5 MCG/ACT AERO Inhale 2 puffs into the lungs 2 (two) times daily. 8.8 g 0    Musculoskeletal: Strength & Muscle Tone: within normal limits Gait & Station: normal Patient leans: N/A  Psychiatric Specialty Exam: Physical Exam  Nursing note and vitals  reviewed. Constitutional: She is oriented to person, place, and time. She appears well-developed and well-nourished.  HENT:  Head: Normocephalic and atraumatic.  Neck: Normal range of motion.  Respiratory: Effort normal.  Musculoskeletal: Normal range of motion.  Neurological: She is alert and oriented to person, place, and time.  Skin: Skin is warm and dry.  Psychiatric: Her speech is normal. Her mood appears anxious. She is actively hallucinating. Thought content is paranoid and delusional. Cognition and memory are normal. She expresses impulsivity and inappropriate judgment.  Review of Systems  Psychiatric/Behavioral: Negative for substance abuse and suicidal ideas.  All other systems reviewed and are negative.   Blood pressure (!) 162/113, pulse 91, temperature 98.4 F (36.9 C), temperature source Oral, resp. rate 20, height 6' (1.829 m), weight 72.3 kg, SpO2 96 %.Body mass index is 21.62 kg/m.  General Appearance: Casual , dressed in purple scrubs, combing her hair  Eye Contact:  Good  Speech:  Clear and Coherent and Normal Rate  Volume:  Normal  Mood:  Euthymic  Affect:  Congruent and smiling and pleasant today  Thought Process:  Disorganized and Descriptions of Associations: Loose  Orientation:  Full (Time, Place, and Person)  Thought Content:  Delusions and Paranoid Ideation  Suicidal Thoughts:  No, denies  Homicidal Thoughts:  No, denies  Memory:  Immediate;   Fair Recent;   Fair Remote;   Fair  Judgement:  Impaired  Insight:  Lacking  Psychomotor Activity:  Normal  Concentration:  Concentration: Fair  Recall:  [employer] of Knowledge:  Fair  Language:  Good  Akathisia:  NA  Handed:  Right  AIMS (if indicated):   N/A  Assets:  Agricultural consultant Housing Social Support  ADL's:  Intact  Cognition: Impaired due to psychiatric condition.   Sleep:   N/A     Treatment Plan Summary: Daily contact with patient to assess and  evaluate symptoms and progress in treatment, Medication management and Plan Continue to seek inpatient geri psych placement for stabilization  Continue to monitor for safety -Patient is refusing medications and warrants forced medications due to lack of insight about her medical and psychiatric condition secondary to psychosis.  -Will order Zyprexa Zydis 5 mg daily and IM if refuses.  -EKG reviewed and QTc 454 on 7/29. Will closely monitor when starting or increasing QTc prolonging agents.     Disposition: Recommend psychiatric Inpatient admission when medically cleared. Supportive therapy provided about ongoing stressors.  This service was provided via telemedicine using a 2-way, interactive audio and video technology.  Names of all persons participating in this telemedicine service and their role in this encounter. Name: Jinny Blossom  Role: PMHNP  Name: Alvira Monday  Role: Patient  Name: Buford Dresser, DO Role: Psychiatrist    Ethelene Hal, NP 03/07/2019 11:17 AM   Patient seen by telemedicine for psychiatric evaluation, chart reviewed and case discussed with the physician extender and developed treatment plan. Reviewed the information documented and agree with the treatment plan.  Buford Dresser, DO 03/07/19 12:45 PM

## 2019-03-07 NOTE — ED Notes (Signed)
COVID test done per request of receiving hospital. She was cooperative with the procedure though she stated she wanted to wait for her family that she was expecting tonight. Reminded her it was too late for visitors but she responded they would be at the front desk. Went forward with the test as family wouldn't interfere with the test and it is unlikely she has spoken to them or that anyone will be coming tonight. She has refused all offers of fluids or food. Sitting in recliner at the edge of the chair watching. Sitter at doorway.

## 2019-03-07 NOTE — ED Notes (Signed)
Said when Probation officer told her that her daughter brought her in because her daughter reported her behavior was different than usual, she said she was being poisoned at home. She often is staring at Probation officer when I look over at her, and appears angry and suspicious.

## 2019-03-07 NOTE — ED Notes (Signed)
Told she would need to have another COVID test per the request of the hospital she will be going to in [facility]. She said OK, but when Probation officer asked to perform it said she would like to wait. Writer agreed to wait for now but it will be done this shift. States she got a shot today and it made her sleep. Continues to appear to be on a watch of some type. Very alert to unit activities.

## 2019-03-07 NOTE — ED Notes (Signed)
I asked the pt to allow me to get her vitals and she refused asking to wait until she was informed she was leaving. I stated obtaining her vitals would assist with her leaving and she replied by saying she wouldn't allow a man to do anything for her.

## 2019-03-07 NOTE — ED Notes (Signed)
Pt refused to give permission to speak to her daughter.  Patient did talk to her on the phone.

## 2019-03-07 NOTE — Progress Notes (Signed)
CSW received message from patient's daughter, Natalie [name], for a call back. Of note, patient refused for a RN to speak to a daughter. CSW got clarification of this and RN reports it was another daughter she spoke to. CSW asked patient if she was agreeable for her to speak with her daughter. Patient stated that she would rather talk to her daughter, but did not want to do it at that time. CSW informed patient that she would call her daughter and just let her know the message was received and patient's request.   CSW called Sherri and explained patient's request. Natalie [name] reports she was confused as to why CSW could not give information as she was the one that brought patient in. Sherri asked how to get health care POA for patient. CSW explained the process and also explained that it may be different due to [place] visitor restrictions. CSW also explained because her mom is psych health care POA may not work as she intends. CSW recommended the guardianship process. Sherri reports she really just wants to talk to her mother. CSW had RN assist patient in getting in contact with her daughter.   Golden Circle, LCSW Transitions of Care Department [facility] ED [phone]

## 2019-03-07 NOTE — ED Notes (Signed)
Refuses all offers of comfort measures to help her relax and possibly sleep. Refused to recline chair she is sitting in, refused tea and refused a warm blanket. Came up to the nurses station after going to the threshold of her door twice to tell writer there was something in the wall was I aware of it. Returned to her room when Probation officer said I was not aware or hearing anything in the wall.

## 2019-03-07 NOTE — ED Notes (Signed)
Consulted Dr for something to help her relax and sleep. She has been awake all shift and has been pacing the room and even when sitting she is moving. Her BP and pulse have been elevated. Order for Ativan given for her prn and when writer tried to give it to her she was sarcastic, resistant, took it but tech noted she put it down her shirt and didn't take it at all. When confronted with this knowledge she gave Probation officer the pill. States she doesn't want to take it. She seems angry, asking for details of how she got her and then arguing with Probation officer re the answers.

## 2019-03-07 NOTE — ED Notes (Signed)
Alert, sitting on recliner, states she didn't sleep today. Offered her scheduled inhaler, refused it and said her "mouth is messed up" Offered no other details.

## 2019-03-08 ENCOUNTER — Telehealth: Payer: Self-pay

## 2019-03-08 NOTE — ED Notes (Signed)
The belongings returned were in one hospital bag. Informed her daughter [name] that she has gone to [facility]. Her daughter was hostile towards this Probation officer demanding to know my credentials and last name because I would not discuss the particulars of pts admission findings. Was also wanting to argue about why she was going to [place] instead of somewhere else.  The call was a courtesy to the family to let them know where the pt was sent so that they could follow up with them. This Probation officer attempted to communicate that exactly and then this Probation officer ended the conversation.

## 2019-03-08 NOTE — ED Notes (Signed)
Transported to [facility]  By [place]. All belongings were given to the [facility]. Pt continued to be confused and paranoid.

## 2019-03-08 NOTE — ED Notes (Signed)
Abruptly came out of her room briskly walking toward what I thought was going to be the exit but ran into room 27 where a patient was sleeping. She did leave the room when staff asked her to but seems to be responding to internal thoughts or voices, paranoid that compelled her out of her room. Moments later sitting in the recliner in her room she pulled her emergency button in her room, when writer checked on her she was safe and fine. Prior to these events writer had asked her if nights are harder for her since she is not sleeping tonight or last night, she asked me what bothered me, her tone is sarcastic and she didn't answer the question. Then she asked me what I am doing making all those lists and giving them to people which I have not been. Stays hypervigilant, not a relaxed posture in chair, not reclined rather sitting on the edge of the seat and often looking around and leaning forward as if to see or hear something better.

## 2019-03-08 NOTE — ED Notes (Signed)
Report called to [place]. [facility] to transport.

## 2019-03-08 NOTE — Telephone Encounter (Signed)
[facility] Has calling (DPR signed);[name] said she spoke with Dr Silvio Pate on 03/06/19 and was to cb with update on pt. [name] said she received a call this morning that her mom had been transferred from staying in ED for 3 days to a hospital in [place]. Because it was the first place with an open bed. When [name] was speaking with person from [facility] said the individual would not give her her full name and was disrespectful and ugly. [name] is very upset with how she was talked to and also that pts mom was transferred to [facility] wanted Dr Silvio Pate to have this update and I gave [name] the Patient Experience # [phone] to discuss her concerns. [name] voiced understanding and was appreciative. FYI to Dr Silvio Pate.

## 2019-03-08 NOTE — ED Provider Notes (Signed)
Patient accepted to [facility]. Accepting is Dr. Sunnie Nielsen, Nicki Reaper, MD 03/08/19 [phone]

## 2019-03-09 NOTE — Telephone Encounter (Signed)
Let her know that she spoke to the nurse in the ER who was just trying to call as a courtesy. She was involuntarily committed to a geriatric psychiatry center (the first available bed was at [facility] that is why she went there) Hopefully they can find a way to control her psychotic behavior so she can come home soon

## 2019-03-10 DIAGNOSIS — J45909 Unspecified asthma, uncomplicated: Secondary | ICD-10-CM | POA: Diagnosis not present

## 2019-03-11 LAB — CULTURE, BLOOD (ROUTINE X 2)
Culture: NO GROWTH
Culture: NO GROWTH
Special Requests: ADEQUATE
Special Requests: ADEQUATE

## 2019-03-11 NOTE — Telephone Encounter (Signed)
Noted I fear she may have Lewy Body dementia

## 2019-03-11 NOTE — Telephone Encounter (Signed)
Spoke to pt's daughter. Pt will not sign paperwork for clearance to speak to them. They are saying she may have had a traumatic experience or Dementia has escalated. The family is hoping to see her this week.

## 2019-03-22 ENCOUNTER — Telehealth: Payer: Self-pay

## 2019-03-22 NOTE — Telephone Encounter (Signed)
Spoke with patient.  She has appointment on Monday with mental health provider to discuss this medication.  It is actually 700 dollars and she cannot afford it.   Daughter is checking cost at other insurance offices.    I did inform her of Dr. Alla German message, but told her that since she will see Behavioral health on Monday, it would be best to follow them for this concern.   Patient aware and in agreement with plan.  She knows to contact us for follow up as needed or if she/daughter have any questions or concerns.

## 2019-03-22 NOTE — Telephone Encounter (Signed)
Let her know that I can prescribe oral medications (though not that one) or IM haldol if she can't find someone to give her mom this injection. Check back next week to see what happens

## 2019-03-22 NOTE — Telephone Encounter (Signed)
Daughter calls and states that they just got her mother out of the hospital.  They ordered an injectable medication (inzega) paliperidon, and was told to have either an urgent care, Primary care provider or mental health provider administer it to her.    She is due the injection today and needs it given.    After reviewing this medication with Dr. Silvio Pate, we are in agreement that we are not familiar with this antipsychotic medication and are not comfortable administering it from our office.    Daughter was made aware or this and instructed to call her mental health provider that she is to follow up with and/or the discharging behavioral health hospital to inform them.  Mental Health team provider should provide adequately for administration.   Natalie [name] Nature conservation officer) also involved in communication and passed along information to daughter as stated above.

## 2019-03-24 NOTE — Telephone Encounter (Signed)
Noted This would be better handled through Mental Health if possible--but I will help if needed

## 2019-03-25 ENCOUNTER — Ambulatory Visit (INDEPENDENT_AMBULATORY_CARE_PROVIDER_SITE_OTHER): Payer: Medicare Other | Admitting: Psychiatry

## 2019-03-25 ENCOUNTER — Encounter: Payer: Self-pay | Admitting: Psychiatry

## 2019-03-25 ENCOUNTER — Other Ambulatory Visit: Payer: Self-pay

## 2019-03-25 ENCOUNTER — Telehealth: Payer: Self-pay

## 2019-03-25 DIAGNOSIS — F29 Unspecified psychosis not due to a substance or known physiological condition: Secondary | ICD-10-CM

## 2019-03-25 DIAGNOSIS — F09 Unspecified mental disorder due to known physiological condition: Secondary | ICD-10-CM | POA: Insufficient documentation

## 2019-03-25 DIAGNOSIS — G4739 Other sleep apnea: Secondary | ICD-10-CM

## 2019-03-25 DIAGNOSIS — F0670 Mild neurocognitive disorder due to known physiological condition without behavioral disturbance: Secondary | ICD-10-CM | POA: Insufficient documentation

## 2019-03-25 MED ORDER — RISPERIDONE 1 MG PO TABS: 1.0000 mg | ORAL_TABLET | Freq: Every day | ORAL | 1 refills | Status: DC

## 2019-03-25 NOTE — Telephone Encounter (Signed)
orders along with office note was faxed and confirmed to pt
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
## 2019-03-25 NOTE — Telephone Encounter (Signed)
per dr. [name] order a sleep study needed to be ordered

## 2019-03-27 ENCOUNTER — Encounter: Payer: Self-pay | Admitting: Neurology

## 2019-03-27 ENCOUNTER — Telehealth: Payer: Self-pay | Admitting: Internal Medicine

## 2019-03-27 NOTE — Telephone Encounter (Signed)
Patient's Daughter called to give an update. They stated that patient has been scheduled with Neurologist  Oct 29th 2020    She Stated that the Patient has also seen her physiatrist this week at [facility]   C/B # [phone]

## 2019-03-28 NOTE — Telephone Encounter (Signed)
Yes And I see that they changed her to daily oral medications (which is fine as long as she takes it) Schedule follow up within a month or so--after neurologist visit

## 2019-03-28 NOTE — Telephone Encounter (Signed)
Spoke to pt's daughter. She said she will call close to that October Neuro appt or just after when she knows her schedule and schedule F/U here.

## 2019-03-28 NOTE — Telephone Encounter (Signed)
pt has appt set up for aug 25,2020 for a psg study.

## 2019-04-02 DIAGNOSIS — G4733 Obstructive sleep apnea (adult) (pediatric): Secondary | ICD-10-CM | POA: Diagnosis not present

## 2019-04-08 ENCOUNTER — Telehealth: Payer: Self-pay | Admitting: *Deleted

## 2019-04-08 ENCOUNTER — Telehealth: Payer: Self-pay

## 2019-04-08 DIAGNOSIS — G4701 Insomnia due to medical condition: Secondary | ICD-10-CM | POA: Insufficient documentation

## 2019-04-08 DIAGNOSIS — G4700 Insomnia, unspecified: Secondary | ICD-10-CM

## 2019-04-08 MED ORDER — ZOLPIDEM TARTRATE 5 MG PO TABS: 5.0000 mg | ORAL_TABLET | Freq: Every evening | ORAL | 0 refills | Status: DC | PRN

## 2019-04-08 NOTE — Telephone Encounter (Signed)
Ok thanks 

## 2019-04-08 NOTE — Telephone Encounter (Signed)
Spoke to pt. Advised her that Dr Silvio Pate sent the message to Dr [name].

## 2019-04-08 NOTE — Telephone Encounter (Signed)
Attempted to call patient - no response. Left message. Will sent Ambien 5 mg as needed for sleep to pharmacy- left vm.

## 2019-04-08 NOTE — Telephone Encounter (Signed)
She should really get anything for sleep from the psychiatrist Let her know that and I will forward to Dr [name] as well

## 2019-04-08 NOTE — Telephone Encounter (Signed)
pt sleep study is in you box for your review.

## 2019-04-08 NOTE — Telephone Encounter (Signed)
Patient called stating that she went to [facility] and had a sleep study done and she was told that she has sleep apnea. Patient stated that she has to go back tomorrow night to have a second sleep study done so that she can be fitted for her CPAP machine. Patient stated that she was told by [facility] to contact her PCP to see if he would prescribe a sleeping to pill to help her relax and sleep so that they can do the fittings for her machine. Patient requested that a script for a sleeping pill be sent to the pharmacy. Pharmacy [facility]

## 2019-04-08 NOTE — Telephone Encounter (Signed)
pt daughter called states that the sleep study place told her mother that she had to come back to have additional testing done and that they recomend that she asked ordering doctor to order one sleeping pill for the test tonight.

## 2019-04-09 DIAGNOSIS — G4733 Obstructive sleep apnea (adult) (pediatric): Secondary | ICD-10-CM | POA: Diagnosis not present

## 2019-04-09 NOTE — Telephone Encounter (Signed)
Called patient on 04/08/2019 - left message since she did not pick up. Ambien sent to pharmacy for patient for sleep.

## 2019-04-22 DIAGNOSIS — G4733 Obstructive sleep apnea (adult) (pediatric): Secondary | ICD-10-CM | POA: Diagnosis not present

## 2019-05-06 ENCOUNTER — Encounter: Payer: Self-pay | Admitting: Psychiatry

## 2019-05-06 ENCOUNTER — Ambulatory Visit (INDEPENDENT_AMBULATORY_CARE_PROVIDER_SITE_OTHER): Payer: Medicare Other | Admitting: Psychiatry

## 2019-05-06 ENCOUNTER — Other Ambulatory Visit: Payer: Self-pay

## 2019-05-06 DIAGNOSIS — F09 Unspecified mental disorder due to known physiological condition: Secondary | ICD-10-CM

## 2019-05-06 DIAGNOSIS — F29 Unspecified psychosis not due to a substance or known physiological condition: Secondary | ICD-10-CM | POA: Diagnosis not present

## 2019-05-06 DIAGNOSIS — G4700 Insomnia, unspecified: Secondary | ICD-10-CM

## 2019-05-06 MED ORDER — RISPERIDONE 0.5 MG PO TABS: 0.5000 mg | ORAL_TABLET | Freq: Every day | ORAL | 1 refills | Status: DC

## 2019-05-06 NOTE — Progress Notes (Signed)
Virtual Visit via Video Note  I connected with Natalie [name] on 05/06/19 at 10:00 AM EDT by a video enabled telemedicine application and verified that I am speaking with the correct person using two identifiers.   I discussed the limitations of evaluation and management by telemedicine and the availability of in person appointments. The patient expressed understanding and agreed to proceed.  I discussed the assessment and treatment plan with the patient. The patient was provided an opportunity to ask questions and all were answered. The patient agreed with the plan and demonstrated an understanding of the instructions.   The patient was advised to call back or seek an in-person evaluation if the symptoms worsen or if the condition fails to improve as anticipated.   [place] MD OP Progress Note  05/06/2019 1:07 PM Natalie [name]  MRN:  [id number]  Chief Complaint:  Chief Complaint    Follow-up     HPI: Natalie [name] is a 68 year old African-American female, retired, currently lives with her daughter, has a history of psychosis, cognitive disorder, hypokalemia, asthma was evaluated by telemedicine today.  Patient was recently admitted to inpatient [facility] - psychiatric unit at [facility] in July 2020.  Patient today however reports she has been doing well.  She denies any voices or visual hallucinations.  She denies any paranoia.  She does not appear to be preoccupied with any delusions.  She reports sleep is good.  Patient appeared to be alert, oriented to person place and situation.  Patient reports she is compliant on medications.  Collateral information was obtained from her daughter Natalie [name].  Per daughter patient seems to be sleeping too much and also appears to have some delay when she is asked a question.  Daughter feels maybe she is overmedicated.  Discussed with her that her risperidone can be reduced to a lower dosage to see if that will help.  She does have upcoming appointment  with neurology for consultation.  Also reviewed and discussed sleep study report.  Patient had sleep study done on 04/02/2019 and also 04/12/2019-' per sleep study a CPAP titration would be recommended for sleep apnea.  Would also recommend adding a sleep aid which does not significantly interfere with REM sleep like Ambien, Lunesta.  Would need to avoid Benadryl and Tylenol PM and benzodiazepines.  Would recommend weight loss in a patient with a BMI of 31.9'  Visit Diagnosis:    ICD-10-CM   1. Psychosis, unspecified psychosis type ([place])  F29 risperiDONE (RISPERDAL) 0.5 MG tablet  2. Insomnia, unspecified type  G47.00   3. Cognitive disorder  F09     Past Psychiatric History: I have reviewed past psychiatric history from my progress note on 03/25/2019.  Past trials of Invega Sustenna, risperidone  Past Medical History:  Past Medical History:  Diagnosis Date  . Allergy   . Arthritis   . Asthma   . Cataract   . GERD (gastroesophageal reflux disease)    pt. denied  . Heart murmur    was told years ago  . History of shingles 2006  . Hyperlipidemia     Past Surgical History:  Procedure Laterality Date  . ABDOMINAL HYSTERECTOMY    . COLONOSCOPY    . POLYPECTOMY    . VAGINAL DELIVERY     x2    Family Psychiatric History: Reviewed family psychiatric history from my progress note on 03/25/2019  Family History:  Family History  Problem Relation Age of Onset  . Arthritis Mother   . Diabetes  Mother   . Hypertension Mother   . Liver cancer Mother   . Colon cancer Father 74  . Pancreatic cancer Brother   . Colon polyps Brother   . Breast cancer Daughter 40  . Alcohol abuse Other   . Heart disease Neg Hx   . Esophageal cancer Neg Hx   . Rectal cancer Neg Hx   . Stomach cancer Neg Hx     Social History: Reviewed social history from my progress note on 03/25/2019 Social History   Socioeconomic History  . Marital status: Single    Spouse name: Not on file  . Number of children:  2  . Years of education: Not on file  . Highest education level: Not on file  Occupational History  . 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 Financial resource strain: Not very hard  . Food insecurity    Worry: Sometimes true    Inability: Sometimes true  . Transportation needs    Medical: Yes    Non-medical: Yes  Tobacco Use  . Smoking status: Never Smoker  . Smokeless tobacco: Never Used  Substance and Sexual Activity  . Alcohol use: No  . Drug use: No  . Sexual activity: Not Currently  Lifestyle  . Physical activity    Days per week: 0 days    Minutes per session: 0 min  . Stress: Not on file  Relationships  . Social Herbalist on phone: Not on file    Gets together: Not on file    Attends religious service: Never    Active member of club or organization: No    Attends meetings of clubs or organizations: Never    Relationship status: Never married  Other Topics Concern  . Not on file  Social History Narrative   No living will   Requests daughter Natalie [name] as health care POA   Not sure about resuscitation   Not sure about tube feeds    Allergies: No Known Allergies  Metabolic Disorder Labs: No results found for: HGBA1C, MPG No results found for: PROLACTIN Lab Results  Component Value Date   CHOL 228 (H) 05/14/2018   TRIG 167.0 (H) 05/14/2018   HDL 48.60 05/14/2018   CHOLHDL 5 05/14/2018   VLDL 33.4 05/14/2018   LDLCALC 146 (H) 05/14/2018   LDLCALC 161 (H) 04/11/2017   Lab Results  Component Value Date   TSH 1.448 03/04/2019   TSH 0.952 04/04/2012    Therapeutic Level Labs: No results found for: LITHIUM No results found for: VALPROATE No components found for:  CBMZ  Current Medications: Current Outpatient Medications  Medication Sig Dispense Refill  . albuterol (PROAIR HFA) 108 (90 Base) MCG/ACT inhaler Inhale 2 puffs into the lungs every 6 (six) hours as needed for wheezing or  shortness of breath. 6.7 g 3  . cetirizine (ZYRTEC) 10 MG tablet Take 10 mg by mouth daily.      . mometasone-formoterol (DULERA) 200-5 MCG/ACT AERO Inhale 2 puffs into the lungs 2 (two) times daily. 8.8 g 0  . potassium chloride (K-DUR) 10 MEQ tablet Take by mouth.    . risperiDONE (RISPERDAL) 0.5 MG tablet Take 1 tablet (0.5 mg total) by mouth at bedtime. 30 tablet 1  . zolpidem (AMBIEN) 5 MG tablet Take 1 tablet (5 mg total) by mouth at bedtime as needed for sleep. 5 tablet 0   No current facility-administered medications for this visit.  Musculoskeletal: Strength & Muscle Tone: UTA Gait & Station: Observed as seated Patient leans: N/A  Psychiatric Specialty Exam: Review of Systems  Psychiatric/Behavioral: Negative for depression, hallucinations, substance abuse and suicidal ideas. The patient is not nervous/anxious.        Possible excessive sleep  All other systems reviewed and are negative.   There were no vitals taken for this visit.There is no height or weight on file to calculate BMI.  General Appearance: Casual  Eye Contact:  Fair  Speech:  Clear and Coherent  Volume:  Normal  Mood:  Euthymic  Affect:  Congruent  Thought Process:  Goal Directed and Descriptions of Associations: Intact  Orientation:  Full (Time, Place, and Person)  Thought Content: Logical   Suicidal Thoughts:  No  Homicidal Thoughts:  No  Memory:  Immediate;   Fair Recent;   Fair Remote;   Fair  Judgement:  Fair  Insight:  Fair  Psychomotor Activity:  Normal  Concentration:  Concentration: Fair and Attention Span: Fair  Recall:  [employer] of Knowledge: Fair  Language: Fair  Akathisia:  No  Handed:  Right  AIMS (if indicated): denies tremors, rigidity,stiffness  Assets:  Communication Skills Desire for Improvement Housing Social Support  ADL's:  Intact  Cognition: WNL  Sleep:  excessive   Screenings: PHQ2-9     Office Visit from 04/11/2017 in [place] at [place] from 04/06/2016 in [place] at [place] from 04/06/2015 in [place] at [place] from 04/04/2014 in [place] at [place] from 04/03/2013 in [place] at [facility] Total Score  0  0  0  0  0       Assessment and Plan: Jonia is a 68 year old African-American female, single, retired, currently lives with her daughter Natalie [name], has a history of recent psychotic episode, cognitive problems, asthma, hypokalemia was evaluated by telemedicine today.  Patient is currently making progress with regards to her mood symptoms and denies any psychosis.  She however appears to have some side effects to medications.  Will reduce dosage.  Collateral information was obtained from her daughter.  Plan Psychosis unspecified- stable Reduce risperidone to 0.5 mg p.o. nightly  Rule out cognitive disorder-patient has been referred to neurology and has upcoming appointment  Patient with obstructive sleep apnea- had sleep study done on 04/02/2019 as summarized above.  Patient will benefit from CPAP titration.  Collateral information was obtained from [place] [facility] who reports patient appears to have some side effects to the risperidone, has excessive sleep, delayed response when a question is asked.  Follow-up in clinic in 1 month or sooner if needed.  October 27 at 4:30 PM  I have spent atleast 25 minutes non  face to face with patient today. More than 50 % of the time was spent for psychoeducation and supportive psychotherapy and care coordination. This note was generated in part or whole with voice recognition software. Voice recognition is usually quite accurate but there are transcription errors that can and very often do occur. I apologize for any typographical errors that were not detected and corrected.       Ursula Alert, MD 05/06/2019, 1:07 PM

## 2019-05-17 ENCOUNTER — Other Ambulatory Visit: Payer: Self-pay

## 2019-05-17 ENCOUNTER — Encounter: Payer: Self-pay | Admitting: Internal Medicine

## 2019-05-17 ENCOUNTER — Ambulatory Visit (INDEPENDENT_AMBULATORY_CARE_PROVIDER_SITE_OTHER): Payer: Medicare Other | Admitting: Internal Medicine

## 2019-05-17 VITALS — BP 120/86 | HR 107 | Temp 97.3°F | Ht 62.5 in | Wt 191.0 lb

## 2019-05-17 DIAGNOSIS — E876 Hypokalemia: Secondary | ICD-10-CM | POA: Insufficient documentation

## 2019-05-17 DIAGNOSIS — Z23 Encounter for immunization: Secondary | ICD-10-CM

## 2019-05-17 DIAGNOSIS — Z0000 Encounter for general adult medical examination without abnormal findings: Secondary | ICD-10-CM

## 2019-05-17 DIAGNOSIS — G4739 Other sleep apnea: Secondary | ICD-10-CM | POA: Diagnosis not present

## 2019-05-17 DIAGNOSIS — J4520 Mild intermittent asthma, uncomplicated: Secondary | ICD-10-CM

## 2019-05-17 DIAGNOSIS — F29 Unspecified psychosis not due to a substance or known physiological condition: Secondary | ICD-10-CM | POA: Diagnosis not present

## 2019-05-17 DIAGNOSIS — Z7189 Other specified counseling: Secondary | ICD-10-CM

## 2019-05-17 LAB — RENAL FUNCTION PANEL
Albumin: 4.3 g/dL (ref 3.5–5.2)
BUN: 7 mg/dL (ref 6–23)
CO2: 30 mEq/L (ref 19–32)
Calcium: 9.9 mg/dL (ref 8.4–10.5)
Chloride: 102 mEq/L (ref 96–112)
Creatinine, Ser: 0.87 mg/dL (ref 0.40–1.20)
GFR: 77.81 mL/min (ref >60.00)
Glucose, Bld: 103 mg/dL — ABNORMAL HIGH (ref 70–99)
Phosphorus: 3.2 mg/dL (ref 2.3–4.6)
Potassium: 4.1 mEq/L (ref 3.5–5.1)
Sodium: 140 mEq/L (ref 135–145)

## 2019-05-17 NOTE — Assessment & Plan Note (Signed)
See social history 

## 2019-05-17 NOTE — Assessment & Plan Note (Signed)
Better on low dose risperidone Due for neurology evaluation on Monday Still worrisome for early Lewy body dementia despite no major cognitive changes now

## 2019-05-17 NOTE — Assessment & Plan Note (Signed)
No clear reason for this If normal this time---will stop the potassium

## 2019-05-17 NOTE — Progress Notes (Signed)
Subjective:    Patient ID: Natalie [name], female    DOB: 09/16/1950, 68 y.o.   MRN: [id number]  HPI Here for Medicare wellness visit and follow up of chronic health conditions Reviewed form and advanced directives Reviewed other doctors No tobacco or alcohol Does a little walking Vision and hearing are fine No falls No depression or anhedonia--other than COVID related isolation Independent with instrumental ADLs No obvious memory problems  Reviewed psychiatry note--gero-psych admission Feels better on the lower dose of risperidone No longer having the hallucinations Sleeping better --not using the zolpidem  Had sleep study Was looking into getting CPAP but had insurance problems Had the [facility] at [facility] in [place] seems controlled Not using the dulera Only has the rescue inhaler ---at most once a day (discussed using spacer) No regular cough Occasional DOE with steps---stable  Mild joint issues Uses 2 advil once a day--this helps  Current Outpatient Medications on File Prior to Visit  Medication Sig Dispense Refill  . albuterol (PROAIR HFA) 108 (90 Base) MCG/ACT inhaler Inhale 2 puffs into the lungs every 6 (six) hours as needed for wheezing or shortness of breath. 6.7 g 3  . cetirizine (ZYRTEC) 10 MG tablet Take 10 mg by mouth daily.      . potassium chloride (K-DUR) 10 MEQ tablet Take by mouth.    . risperiDONE (RISPERDAL) 0.5 MG tablet Take 1 tablet (0.5 mg total) by mouth at bedtime. 30 tablet 1   No current facility-administered medications on file prior to visit.     No Known Allergies  Past Medical History:  Diagnosis Date  . Allergy   . Arthritis   . Asthma   . Cataract   . GERD (gastroesophageal reflux disease)    pt. denied  . Heart murmur    was told years ago  . History of shingles 2006  . Hyperlipidemia     Past Surgical History:  Procedure Laterality Date  . ABDOMINAL HYSTERECTOMY    . COLONOSCOPY    . POLYPECTOMY     . VAGINAL DELIVERY     x2    Family History  Problem Relation Age of Onset  . Arthritis Mother   . Diabetes Mother   . Hypertension Mother   . Liver cancer Mother   . Colon cancer Father 67  . Pancreatic cancer Brother   . Colon polyps Brother   . Breast cancer Daughter 33  . Alcohol abuse Other   . Heart disease Neg Hx   . Esophageal cancer Neg Hx   . Rectal cancer Neg Hx   . Stomach cancer Neg Hx     Social History   Socioeconomic History  . Marital status: Single    Spouse name: Not on file  . Number of children: 2  . Years of education: Not on file  . Highest education level: Not on file  Occupational History  . 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 Financial resource strain: Not very hard  . Food insecurity    Worry: Sometimes true    Inability: Sometimes true  . Transportation needs    Medical: Yes    Non-medical: Yes  Tobacco Use  . Smoking status: Never Smoker  . Smokeless tobacco: Never Used  Substance and Sexual Activity  . Alcohol use: No  . Drug use: No  . Sexual activity: Not Currently  Lifestyle  . Physical activity  Days per week: 0 days    Minutes per session: 0 min  . Stress: Not on file  Relationships  . Social Herbalist on phone: Not on file    Gets together: Not on file    Attends religious service: Never    Active member of club or organization: No    Attends meetings of clubs or organizations: Never    Relationship status: Never married  . Intimate partner violence    Fear of current or ex partner: No    Emotionally abused: Not on file    Physically abused: No    Forced sexual activity: Not on file  Other Topics Concern  . Not on file  Social History Narrative   No living will   Requests daughter [name] as health care POA   Not sure about resuscitation   Not sure about tube feeds   Review of Systems Appetite is good Weight down slightly Wears  seat belt Teeth okay---overdue with dentist (COVID). Has partial Has some skin tags--no suspicious lesions No heartburn or dysphagia Bowels move fine. No blood Voids okay. Some urgency--but no incontinence No chest pain or palpitations No dizziness or syncope    Objective:   Physical Exam  Constitutional: She is oriented to person, place, and time. She appears well-developed. No distress.  HENT:  Mouth/Throat: Oropharynx is clear and moist. No oropharyngeal exudate.  Neck: No thyromegaly present.  Cardiovascular: Normal rate, regular rhythm, normal heart sounds and intact distal pulses. Exam reveals no gallop.  No murmur heard. Respiratory: Effort normal and breath sounds normal. No respiratory distress. She has no wheezes. She has no rales.  GI: Soft. There is no abdominal tenderness.  Musculoskeletal:        General: No tenderness or edema.  Lymphadenopathy:    She has no cervical adenopathy.  Neurological: She is alert and oriented to person, place, and time.  President--- "Trump, Obama, Clinton----Bush" [phone] [id number] Recall 3/3  Skin: No rash noted. No erythema.  Psychiatric: She has a normal mood and affect. Her behavior is normal.           Assessment & Plan:

## 2019-05-17 NOTE — Assessment & Plan Note (Signed)
Seems to be quiet now Off dulera Albuterol prn only

## 2019-05-17 NOTE — Patient Instructions (Signed)
Please call [facility] again to find out about the CPAP machine

## 2019-05-17 NOTE — Assessment & Plan Note (Addendum)
She needs to check back with the place she had the study ---[facility] AHI was only 13--but unclear if this could be related to the psychosis she had

## 2019-05-17 NOTE — Assessment & Plan Note (Signed)
I have personally reviewed the Medicare Annual Wellness questionnaire and have noted 1. The patient's medical and social history 2. Their use of alcohol, tobacco or illicit drugs 3. Their current medications and supplements 4. The patient's functional ability including ADL's, fall risks, home safety risks and hearing or visual             impairment. 5. Diet and physical activities 6. Evidence for depression or mood disorders  The patients weight, height, BMI and visual acuity have been recorded in the chart I have made referrals, counseling and provided education to the patient based review of the above and I have provided the pt with a written personalized care plan for preventive services.  I have provided you with a copy of your personalized plan for preventive services. Please take the time to review along with your updated medication list.  Will be due for mammogram next month Colon due 2024 Discussed increasing walking Flu vaccine today Prefers no pneumonia vaccines

## 2019-05-20 ENCOUNTER — Ambulatory Visit: Payer: Medicare Other | Admitting: Neurology

## 2019-05-20 ENCOUNTER — Encounter: Payer: Self-pay | Admitting: Neurology

## 2019-05-20 ENCOUNTER — Other Ambulatory Visit: Payer: Self-pay

## 2019-05-20 VITALS — BP 116/77 | HR 97 | Ht 62.5 in | Wt 190.5 lb

## 2019-05-20 DIAGNOSIS — F200 Paranoid schizophrenia: Secondary | ICD-10-CM | POA: Diagnosis not present

## 2019-05-20 DIAGNOSIS — R443 Hallucinations, unspecified: Secondary | ICD-10-CM
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
## 2019-05-20 NOTE — Patient Instructions (Addendum)
1. Schedule MRI brain with and without contrast. This will be scheduled by [facility]. Their office is located at [address]. Telephone number [phone]. They will call you with an appointment date and time.  2. Continue follow-up with Psychiatry, discuss side effects of Risperdal  3. Continue close supervision, follow-up in 6 months, call for any changes  FALL PRECAUTIONS: Be cautious when walking. Scan the area for obstacles that may increase the risk of trips and falls. When getting up in the mornings, sit up at the edge of the bed for a few minutes before getting out of bed. Consider elevating the bed at the head end to avoid drop of blood pressure when getting up. Walk always in a well-lit room (use night lights in the walls). Avoid area rugs or power cords from appliances in the middle of the walkways. Use a walker or a cane if necessary and consider physical therapy for balance exercise. Get your eyesight checked regularly.  HOME SAFETY: Consider the safety of the kitchen when operating appliances like stoves, microwave oven, and blender. Consider having supervision and share cooking responsibilities until no longer able to participate in those. Accidents with firearms and other hazards in the house should be identified and addressed as well.  DRIVING: Regarding driving, in patients with progressive memory problems, driving will be impaired. We advise to have someone else do the driving if trouble finding directions or if minor accidents are reported. Independent driving assessment is available to determine safety of driving.  ABILITY TO BE LEFT ALONE: If patient is unable to contact 911 operator, consider using LifeLine, or when the need is there, arrange for someone to stay with patients. Smoking is a fire hazard, consider supervision or cessation. Risk of wandering should be assessed by caregiver and if detected at any point, supervision and safe proof recommendations should  be instituted.  MEDICATION SUPERVISION: Inability to self-administer medication needs to be constantly addressed. Implement a mechanism to ensure safe administration of the medications.  RECOMMENDATIONS FOR ALL PATIENTS WITH MEMORY PROBLEMS: 1. Continue to exercise (Recommend 30 minutes of walking everyday, or 3 hours every week) 2. Increase social interactions - continue going to [place] and enjoy social gatherings with friends and family 3. Eat healthy, avoid fried foods and eat more fruits and vegetables 4. Maintain adequate blood pressure, blood sugar, and blood cholesterol level. Reducing the risk of stroke and cardiovascular disease also helps promoting better memory. 5. Avoid stressful situations. Live a simple life and avoid aggravations. Organize your time and prepare for the next day in anticipation. 6. Sleep well, avoid any interruptions of sleep and avoid any distractions in the bedroom that may interfere with adequate sleep quality 7. Avoid sugar, avoid sweets as there is a strong link between excessive sugar intake, diabetes, and cognitive impairment The Mediterranean diet has been shown to help patients reduce the risk of progressive memory disorders and reduces cardiovascular risk. This includes eating fish, eat fruits and green leafy vegetables, nuts like almonds and hazelnuts, walnuts, and also use olive oil. Avoid fast foods and fried foods as much as possible. Avoid sweets and sugar as sugar use has been linked to worsening of memory function.  There is always a concern of gradual progression of memory problems. If this is the case, then we may need to adjust level of care according to patient needs. Support, both to the patient and caregiver, should then be put into place.

## 2019-05-30 ENCOUNTER — Telehealth: Payer: Self-pay | Admitting: Internal Medicine

## 2019-05-30 DIAGNOSIS — N6320 Unspecified lump in the left breast, unspecified quadrant: Secondary | ICD-10-CM

## 2019-05-30 NOTE — Telephone Encounter (Signed)
Well, I told her to stop it if the potassium is normal now---so confirm this with her. I see my lab note had a typo----said "not" normal instead of "now" normal

## 2019-05-30 NOTE — Telephone Encounter (Signed)
Patient called stating that she is needing a referral sent over to [facility] @ [facility] so she can schedule her appointment with them

## 2019-05-30 NOTE — Telephone Encounter (Signed)
Please let her know that the orders were put in

## 2019-05-30 NOTE — Telephone Encounter (Signed)
I will let her know when I let her know about the potassium in another message.

## 2019-05-30 NOTE — Telephone Encounter (Signed)
Pt called to get lab results   She wanted to know if she needs to get a refill on her potassium  [facility] [address]   Please advise  Pt has not received results in mail yet

## 2019-05-30 NOTE — Telephone Encounter (Signed)
Since her potassium level is normal, I would assume she is supposed to keep taking it. Will check with Dr Silvio Pate

## 2019-05-31 NOTE — Telephone Encounter (Signed)
Spoke to pt. Stopped potassium on her list.

## 2019-06-02 ENCOUNTER — Encounter: Payer: Self-pay | Admitting: Neurology

## 2019-06-04 ENCOUNTER — Encounter: Payer: Self-pay | Admitting: Psychiatry

## 2019-06-04 ENCOUNTER — Ambulatory Visit (INDEPENDENT_AMBULATORY_CARE_PROVIDER_SITE_OTHER): Payer: Medicare Other | Admitting: Psychiatry

## 2019-06-04 ENCOUNTER — Other Ambulatory Visit: Payer: Self-pay

## 2019-06-04 DIAGNOSIS — F29 Unspecified psychosis not due to a substance or known physiological condition: Secondary | ICD-10-CM

## 2019-06-04 DIAGNOSIS — G4700 Insomnia, unspecified: Secondary | ICD-10-CM | POA: Diagnosis not present

## 2019-06-04 DIAGNOSIS — F09 Unspecified mental disorder due to known physiological condition: Secondary | ICD-10-CM | POA: Diagnosis not present

## 2019-06-04 NOTE — Progress Notes (Signed)
virtual Visit via Telephone Note  I connected with Natalie [name] on 06/04/19 at  4:15 PM EDT by telephone and verified that I am speaking with the correct person using two identifiers.   I discussed the limitations, risks, security and privacy concerns of performing an evaluation and management service by telephone and the availability of in person appointments. I also discussed with the patient that there may be a patient responsible charge related to this service. The patient expressed understanding and agreed to proceed.     I discussed the assessment and treatment plan with the patient. The patient was provided an opportunity to ask questions and all were answered. The patient agreed with the plan and demonstrated an understanding of the instructions.   The patient was advised to call back or seek an in-person evaluation if the symptoms worsen or if the condition fails to improve as anticipated.   [place] MD OP Progress Note  06/04/2019 4:59 PM [name]  MRN:  [id number]  Chief Complaint:  Chief Complaint    Follow-up     HPI: Natalie [name] is a 68 year old African-American female, retired, currently lives with her daughter, has a history of psychosis, cognitive disorder, asthma, hypokalemia was evaluated by phone today.  Patient preferred to do a phone call.  Writer initially spoke to daughter-[name].  Per daughter patient currently does not have any psychosis.  She denies any voices or visual hallucinations.  She does not appear to be delusional or paranoid.  However daughter reports patient is having negative side effects to risperidone.  She stays in bed all day and appears to have a very delayed response time.  Writer spoke to patient by phone.  Per patient she is currently sleeping okay at night.  She was unable to get her CPAP machine due to the cost.  Unknown if her fatigue and excessive sleep during the day likely is also due to the same.  Patient currently denies any  depression or anxiety symptoms.  She is bored about the social isolation due to the pandemic.  She currently denies any perceptual disturbances.  Patient appeared to be alert, oriented to person place time and situation.  Patient denies any memory problems however she had neurology consult recently.  Per review of records from her neurologist-Dr. Silvio Pate' patient probably with mild cognitive disorder-slums-25 out of 30.  Patient is scheduled to get an MRI.'  Discussed with patient about discontinuing risperidone since she does not have any psychotic symptoms at this time.  Discussed with daughter as well as patient to monitor symptoms closely.  Visit Diagnosis:    ICD-10-CM   1. Psychosis, unspecified psychosis type ([place])  F29    resolved  2. Insomnia, unspecified type  G47.00    resolved  3. Cognitive disorder  F09     Past Psychiatric History: I have reviewed past psychiatric history from my progress note on 03/25/2019.  Past trials of Invega Sustenna, risperidone  Past Medical History:  Past Medical History:  Diagnosis Date  . Allergy   . Arthritis   . Asthma   . Cataract   . GERD (gastroesophageal reflux disease)    pt. denied  . Heart murmur    was told years ago  . History of shingles 2006  . Hyperlipidemia     Past Surgical History:  Procedure Laterality Date  . ABDOMINAL HYSTERECTOMY    . COLONOSCOPY    . POLYPECTOMY    . VAGINAL DELIVERY     x2  Family Psychiatric History: I have reviewed family psychiatric history from my progress note on 03/25/2019.  Family History:  Family History  Problem Relation Age of Onset  . Arthritis Mother   . Diabetes Mother   . Hypertension Mother   . Liver cancer Mother   . Colon cancer Father 75  . Pancreatic cancer Brother   . Colon polyps Brother   . Breast cancer Daughter 71  . Alcohol abuse Other   . Heart disease Neg Hx   . Esophageal cancer Neg Hx   . Rectal cancer Neg Hx   . Stomach cancer Neg Hx     Social  History: I have reviewed social history from my progress note on 03/25/2019. Social History   Socioeconomic History  . Marital status: Single    Spouse name: Not on file  . Number of children: 2  . Years of education: Not on file  . Highest education level: Not on file  Occupational History  . 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 Financial resource strain: Not very hard  . Food insecurity    Worry: Sometimes true    Inability: Sometimes true  . Transportation needs    Medical: Yes    Non-medical: Yes  Tobacco Use  . Smoking status: Never Smoker  . Smokeless tobacco: Never Used  Substance and Sexual Activity  . Alcohol use: No  . Drug use: No  . Sexual activity: Not Currently  Lifestyle  . Physical activity    Days per week: 0 days    Minutes per session: 0 min  . Stress: Not on file  Relationships  . Social Herbalist on phone: Not on file    Gets together: Not on file    Attends religious service: Never    Active member of club or organization: No    Attends meetings of clubs or organizations: Never    Relationship status: Never married  Other Topics Concern  . Not on file  Social History Narrative   No living will   Requests daughter [name] as health care POA   Would accept resuscitation but no prolonged machines   Not sure about tube feeds      Right handed      Currently lives with daughter      Completed HS    Allergies: No Known Allergies  Metabolic Disorder Labs: No results found for: HGBA1C, MPG No results found for: PROLACTIN Lab Results  Component Value Date   CHOL 228 (H) 05/14/2018   TRIG 167.0 (H) 05/14/2018   HDL 48.60 05/14/2018   CHOLHDL 5 05/14/2018   VLDL 33.4 05/14/2018   LDLCALC 146 (H) 05/14/2018   LDLCALC 161 (H) 04/11/2017   Lab Results  Component Value Date   TSH 1.448 03/04/2019   TSH 0.952 04/04/2012    Therapeutic Level Labs: No results found  for: LITHIUM No results found for: VALPROATE No components found for:  CBMZ  Current Medications: Current Outpatient Medications  Medication Sig Dispense Refill  . albuterol (PROAIR HFA) 108 (90 Base) MCG/ACT inhaler Inhale 2 puffs into the lungs every 6 (six) hours as needed for wheezing or shortness of breath. 6.7 g 3  . cetirizine (ZYRTEC) 10 MG tablet Take 10 mg by mouth daily.       No current facility-administered medications for this visit.      Musculoskeletal: Strength & Muscle Tone: UTA Gait & Station: Reports  as WNL Patient leans: N/A  Psychiatric Specialty Exam: Review of Systems  Psychiatric/Behavioral: Negative for depression, hallucinations, substance abuse and suicidal ideas. The patient is not nervous/anxious and does not have insomnia.   All other systems reviewed and are negative.   There were no vitals taken for this visit.There is no height or weight on file to calculate BMI.  General Appearance: UTA  Eye Contact:  UTA  Speech:  Clear and Coherent  Volume:  Normal  Mood:  Euthymic  Affect:  UTA  Thought Process:  Goal Directed and Descriptions of Associations: Intact  Orientation:  Full (Time, Place, and Person)  Thought Content: Logical   Suicidal Thoughts:  No  Homicidal Thoughts:  No  Memory:  Immediate;   Fair Recent;   Fair Remote;   Fair  Judgement:  Fair  Insight:  Fair  Psychomotor Activity:  UTA  Concentration:  Concentration: Fair and Attention Span: Fair  Recall:  [employer] of Knowledge: Fair  Language: Fair  Akathisia:  No  Handed:  Right  AIMS (if indicated):Denies tremors, rigidity  Assets:  Communication Skills Desire for Improvement Housing Social Support  ADL's:  Intact  Cognition: WNL  Sleep:  excessive    Screenings: PHQ2-9     Office Visit from 05/17/2019 in [place] at [facility] Visit from 04/11/2017 in [place] at [facility] from 04/06/2016 in [place] at [facility] from 04/06/2015 in [place] at [facility] from 04/04/2014 in [place] at [facility] Total Score  0  0  0  0  0       Assessment and Plan: Trudi is a 68 year old African-American female, single, retired, currently lives with her daughter [name], recent episode of psychosis, cognitive problems, asthma, hypokalemia was evaluated by phone today.  Patient currently denies any significant mood symptoms and her psychosis seems to have resolved.  Patient however with negative side effects to the risperidone may benefit from discontinuation of risperidone.  Collateral information was obtained from daughter as summarized above.  Plan Psychosis-resolved Discontinue risperidone. Discussed with patient as well as daughter [name] to monitor symptoms closely.  If she has any recurrence of psychosis to Secondary school teacher or return for a follow-up sooner.  Cognitive disorder-likely mild Patient was referred to neurologist. I have reviewed Dr. Rodman Pickle as summarized above.  Patient is scheduled for MRI.  Patient with obstructive sleep apnea-had sleep study done on 04/02/2019 however patient has been unable to get the CPAP due to the cost.  Unknown if her obstructive sleep apnea could also be contributing to excessive sleepiness during the day.  Follow-up in clinic in 2 to 3 weeks or sooner if needed.  November 18th at 9:30 in the morning  I have spent atleast 15 minutes non  face to face with patient today. More than 50 % of the time was spent for psychoeducation and supportive psychotherapy and care coordination. This note was generated in part or whole with voice recognition software. Voice recognition is usually quite accurate but there are transcription errors that can and very often do occur. I apologize for any typographical errors that were not detected and corrected.       Ursula Alert, MD 06/04/2019, 4:59 PM

## 2019-06-10 ENCOUNTER — Other Ambulatory Visit: Payer: Medicare Other

## 2019-06-12 ENCOUNTER — Ambulatory Visit
Admission: RE | Admit: 2019-06-12 | Discharge: 2019-06-12 | Disposition: A | Discharge status: Home / Self Care | Payer: Medicare Other | Source: Ambulatory Visit | Attending: Neurology | Admitting: Neurology

## 2019-06-12 ENCOUNTER — Other Ambulatory Visit: Payer: Self-pay

## 2019-06-12 DIAGNOSIS — I6502 Occlusion and stenosis of left vertebral artery: Secondary | ICD-10-CM | POA: Diagnosis not present

## 2019-06-12 DIAGNOSIS — J3489 Other specified disorders of nose and nasal sinuses: Secondary | ICD-10-CM | POA: Diagnosis not present

## 2019-06-12 DIAGNOSIS — R443 Hallucinations, unspecified: Secondary | ICD-10-CM

## 2019-06-12 DIAGNOSIS — R410 Disorientation, unspecified: Secondary | ICD-10-CM | POA: Diagnosis not present

## 2019-06-12 DIAGNOSIS — F200 Paranoid schizophrenia: Secondary | ICD-10-CM

## 2019-06-12 MED ORDER — GADOBENATE DIMEGLUMINE 529 MG/ML IV SOLN
18.0000 mL | Freq: Once | INTRAVENOUS | Status: AC | PRN
  Administered 2019-06-12: 18 mL via INTRAVENOUS

## 2019-06-14 ENCOUNTER — Telehealth: Payer: Self-pay

## 2019-06-14 NOTE — Telephone Encounter (Signed)
-----   Message from Cameron Sprang, MD sent at 06/13/2019 10:47 AM EST ----- Pls let daughter know I reviewed MRI brain, no evidence of tumor, stroke, or bleed. It shows age-related changes. Thanks

## 2019-06-14 NOTE — Telephone Encounter (Signed)
Pt informed of MRI results. No concerns at this time.

## 2019-06-26 ENCOUNTER — Ambulatory Visit (INDEPENDENT_AMBULATORY_CARE_PROVIDER_SITE_OTHER): Payer: Medicare Other | Admitting: Psychiatry

## 2019-06-26 ENCOUNTER — Encounter: Payer: Self-pay | Admitting: Psychiatry

## 2019-06-26 ENCOUNTER — Other Ambulatory Visit: Payer: Self-pay

## 2019-06-26 DIAGNOSIS — F29 Unspecified psychosis not due to a substance or known physiological condition: Secondary | ICD-10-CM

## 2019-06-26 DIAGNOSIS — R4189 Other symptoms and signs involving cognitive functions and awareness: Secondary | ICD-10-CM | POA: Diagnosis not present

## 2019-06-26 DIAGNOSIS — F09 Unspecified mental disorder due to known physiological condition: Secondary | ICD-10-CM

## 2019-06-26 DIAGNOSIS — G4700 Insomnia, unspecified: Secondary | ICD-10-CM

## 2019-06-26 NOTE — Progress Notes (Signed)
Virtual Visit via Telephone Note  I connected with Natalie [name] on 06/26/19 at  9:30 AM EST by telephone and verified that I am speaking with the correct person using two identifiers.   I discussed the limitations, risks, security and privacy concerns of performing an evaluation and management service by telephone and the availability of in person appointments. I also discussed with the patient that there may be a patient responsible charge related to this service. The patient expressed understanding and agreed to proceed.      I discussed the assessment and treatment plan with the patient. The patient was provided an opportunity to ask questions and all were answered. The patient agreed with the plan and demonstrated an understanding of the instructions.   The patient was advised to call back or seek an in-person evaluation if the symptoms worsen or if the condition fails to improve as anticipated.   [place] MD OP Progress Note  06/26/2019 10:25 AM Natalie [name]  MRN:  [id number]  Chief Complaint:  Chief Complaint    Follow-up     HPI: Natalie [name] is a 68 year old African-American female, retired, currently lives with her daughter, has a history of psychosis, cognitive disorder, asthma, hypokalemia was evaluated by phone today.  Patient preferred to do a phone call today.  Patient is completely off of the risperidone.  She was last seen on 06/04/2019.  At that visit she was taken off of the risperidone due to possible side effects.  Patient today reports she is currently doing well without the medication.  She denies any significant depression or anxiety symptoms.  She continues to feel bored due to the social isolation due to the pandemic.  She has been trying to go out for a walk when she can.  She denies any psychosis.  Patient denies any sleep problems.  She reports she slept good last night.  She appeared to be alert, oriented to person place time and situation.  She  reports she had an MRI scan of her brain done recently which came back within normal limits.  She appeared to be alert, oriented to person place time and situation, was able to answer questions appropriately.   Visit Diagnosis:    ICD-10-CM   1. Psychosis, unspecified psychosis type ([place])  F29   2. Insomnia, unspecified type  G47.00   3. Cognitive disorder  F09     Past Psychiatric History: Reviewed past psychiatric history from my progress note on 03/25/2019.  Past trials of Invega Sustenna, risperidone.  Past Medical History:  Past Medical History:  Diagnosis Date  . Allergy   . Arthritis   . Asthma   . Cataract   . GERD (gastroesophageal reflux disease)    pt. denied  . Heart murmur    was told years ago  . History of shingles 2006  . Hyperlipidemia     Past Surgical History:  Procedure Laterality Date  . ABDOMINAL HYSTERECTOMY    . COLONOSCOPY    . POLYPECTOMY    . VAGINAL DELIVERY     x2    Family Psychiatric History: Reviewed family psychiatric history from my progress note on 03/25/2019.  Family History:  Family History  Problem Relation Age of Onset  . Arthritis Mother   . Diabetes Mother   . Hypertension Mother   . Liver cancer Mother   . Colon cancer Father 64  . Pancreatic cancer Brother   . Colon polyps Brother   . Breast cancer Daughter 19  .  Alcohol abuse Other   . Heart disease Neg Hx   . Esophageal cancer Neg Hx   . Rectal cancer Neg Hx   . Stomach cancer Neg Hx     Social History: Reviewed social history from my progress note on 03/25/2019. Social History   Socioeconomic History  . Marital status: Single    Spouse name: Not on file  . Number of children: 2  . Years of education: Not on file  . Highest education level: Not on file  Occupational History  . 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 Financial resource strain: Not very hard  . Food insecurity    Worry: Sometimes  true    Inability: Sometimes true  . Transportation needs    Medical: Yes    Non-medical: Yes  Tobacco Use  . Smoking status: Never Smoker  . Smokeless tobacco: Never Used  Substance and Sexual Activity  . Alcohol use: No  . Drug use: No  . Sexual activity: Not Currently  Lifestyle  . Physical activity    Days per week: 0 days    Minutes per session: 0 min  . Stress: Not on file  Relationships  . Social Herbalist on phone: Not on file    Gets together: Not on file    Attends religious service: Never    Active member of club or organization: No    Attends meetings of clubs or organizations: Never    Relationship status: Never married  Other Topics Concern  . Not on file  Social History Narrative   No living will   Requests daughter Natalie [name] as health care POA   Would accept resuscitation but no prolonged machines   Not sure about tube feeds      Right handed      Currently lives with daughter      Completed HS    Allergies: No Known Allergies  Metabolic Disorder Labs: No results found for: HGBA1C, MPG No results found for: PROLACTIN Lab Results  Component Value Date   CHOL 228 (H) 05/14/2018   TRIG 167.0 (H) 05/14/2018   HDL 48.60 05/14/2018   CHOLHDL 5 05/14/2018   VLDL 33.4 05/14/2018   LDLCALC 146 (H) 05/14/2018   LDLCALC 161 (H) 04/11/2017   Lab Results  Component Value Date   TSH 1.448 03/04/2019   TSH 0.952 04/04/2012    Therapeutic Level Labs: No results found for: LITHIUM No results found for: VALPROATE No components found for:  CBMZ  Current Medications: Current Outpatient Medications  Medication Sig Dispense Refill  . albuterol (PROAIR HFA) 108 (90 Base) MCG/ACT inhaler Inhale 2 puffs into the lungs every 6 (six) hours as needed for wheezing or shortness of breath. 6.7 g 3  . cetirizine (ZYRTEC) 10 MG tablet Take 10 mg by mouth daily.       No current facility-administered medications for this visit.       Musculoskeletal: Strength & Muscle Tone: UTA Gait & Station: Reports as WNL Patient leans: N/A  Psychiatric Specialty Exam: Review of Systems  Psychiatric/Behavioral: Negative for depression, hallucinations, substance abuse and suicidal ideas. The patient is not nervous/anxious.   All other systems reviewed and are negative.   There were no vitals taken for this visit.There is no height or weight on file to calculate BMI.  General Appearance: UTA  Eye Contact:  UTA  Speech:  Clear and Coherent  Volume:  Normal  Mood:  Euthymic  Affect:  UTA  Thought Process:  Goal Directed and Descriptions of Associations: Intact  Orientation:  Full (Time, Place, and Person)  Thought Content: Logical   Suicidal Thoughts:  No  Homicidal Thoughts:  No  Memory:  Immediate;   Fair Recent;   Fair Remote;   Fair  Judgement:  Fair  Insight:  Fair  Psychomotor Activity:  UTA  Concentration:  Concentration: Fair and Attention Span: Fair  Recall:  [employer] of Knowledge: Fair  Language: Fair  Akathisia:  No  Handed:  Right  AIMS (if indicated): Denies tremors, rigidity  Assets:  Communication Skills Desire for Improvement Housing Social Support  ADL's:  Intact  Cognition: WNL  Sleep:  Fair   Screenings: PHQ2-9     Office Visit from 05/17/2019 in [place] at [facility] Visit from 04/11/2017 in [place] at [facility] from 04/06/2016 in [place] at [facility] from 04/06/2015 in [place] at [facility] from 04/04/2014 in [place] at [facility] Total Score  0  0  0  0  0       Assessment and Plan: Natalie [name] is a 68 year old African-American female, single, retired, currently lives with her daughter Natalie [name], recent episode of psychosis, cognitive problems, asthma, hypokalemia was evaluated by phone today.  Patient currently is completely off of the risperidone due to side effects.  She  currently denies any perceptual disturbances.  We will continue to monitor closely.  Plan Psychosis-resolved Will monitor closely. She has been off of the risperidone since 06/04/2019.  Cognitive disorder-likely mild Patient will continue to work with neurologist She had MRI scan done-reviewed MRI done on 06/12/2019-within normal limits  Follow-up in clinic in 1 month or sooner if needed.  December 18 at 9:20 AM  I have spent atleast 10 minutes non face to face with patient today. More than 50 % of the time was spent for psychoeducation and supportive psychotherapy and care coordination. This note was generated in part or whole with voice recognition software. Voice recognition is usually quite accurate but there are transcription errors that can and very often do occur. I apologize for any typographical errors that were not detected and corrected.       Ursula Alert, MD 06/26/2019, 10:24 AM

## 2019-06-27 ENCOUNTER — Telehealth: Payer: Self-pay

## 2019-06-27 DIAGNOSIS — F09 Unspecified mental disorder due to known physiological condition: Secondary | ICD-10-CM

## 2019-06-27 DIAGNOSIS — F29 Unspecified psychosis not due to a substance or known physiological condition: Secondary | ICD-10-CM

## 2019-06-27 MED ORDER — RISPERIDONE 0.25 MG PO TABS: 0.2500 mg | ORAL_TABLET | Freq: Every day | ORAL | 1 refills | Status: DC

## 2019-06-27 NOTE — Telephone Encounter (Signed)
pt daughter called states that her mom going back to saying that they are making movies of her and being like a zombie .  daughter states she feels like her mom needs something to help her.

## 2019-06-27 NOTE — Telephone Encounter (Signed)
Returned call to [employer] reports her mom appears to be delusional again, talking about movies that are being made while she was at the hospital.  She is currently off of the risperidone.  Will restart low dosage risperidone 0.25 mg at bedtime.  Advised to return for neurology evaluation-patient was referred for cognitive issues.  She agrees to get neurologist to call back.

## 2019-07-02 ENCOUNTER — Ambulatory Visit
Admission: RE | Admit: 2019-07-02 | Discharge: 2019-07-02 | Disposition: A | Discharge status: Home / Self Care | Payer: Medicare Other | Source: Ambulatory Visit | Attending: Internal Medicine | Admitting: Internal Medicine

## 2019-07-02 DIAGNOSIS — N6322 Unspecified lump in the left breast, upper inner quadrant: Secondary | ICD-10-CM | POA: Insufficient documentation

## 2019-07-02 DIAGNOSIS — R928 Other abnormal and inconclusive findings on diagnostic imaging of breast: Secondary | ICD-10-CM | POA: Diagnosis not present

## 2019-07-02 DIAGNOSIS — N6320 Unspecified lump in the left breast, unspecified quadrant: Secondary | ICD-10-CM

## 2019-07-08 ENCOUNTER — Telehealth: Payer: Self-pay

## 2019-07-08 NOTE — Telephone Encounter (Signed)
Returned call to daughter-left message.  Writer called patient and spoke to her.  Patient reports she is worried about her financial situation at this time.  She does not feel she is overtly depressed.  She reports she does get out of bed and does things around the house.  She does not believe she needs any medication changes or need to go to a therapist at this point.  Writer advised patient to call us back if she needs help.  Patient has upcoming appointment with writer on 07/26/2019.

## 2019-07-08 NOTE — Telephone Encounter (Signed)
daughter called states that her mom is depressed, she does want to do anything, and staying in the bed all day.

## 2019-07-26 ENCOUNTER — Ambulatory Visit (INDEPENDENT_AMBULATORY_CARE_PROVIDER_SITE_OTHER): Payer: Medicare Other | Admitting: Psychiatry

## 2019-07-26 ENCOUNTER — Other Ambulatory Visit: Payer: Self-pay

## 2019-07-26 ENCOUNTER — Encounter: Payer: Self-pay | Admitting: Psychiatry

## 2019-07-26 DIAGNOSIS — F29 Unspecified psychosis not due to a substance or known physiological condition: Secondary | ICD-10-CM | POA: Diagnosis not present

## 2019-07-26 DIAGNOSIS — F09 Unspecified mental disorder due to known physiological condition: Secondary | ICD-10-CM

## 2019-07-26 NOTE — Progress Notes (Signed)
Virtual Visit via Telephone Note  I connected with Natalie [name] on 07/26/19 at  9:20 AM EST by telephone and verified that I am speaking with the correct person using two identifiers.   I discussed the limitations, risks, security and privacy concerns of performing an evaluation and management service by telephone and the availability of in person appointments. I also discussed with the patient that there may be a patient responsible charge related to this service. The patient expressed understanding and agreed to proceed.      I discussed the assessment and treatment plan with the patient. The patient was provided an opportunity to ask questions and all were answered. The patient agreed with the plan and demonstrated an understanding of the instructions.   The patient was advised to call back or seek an in-person evaluation if the symptoms worsen or if the condition fails to improve as anticipated.   [place] MD OP Progress Note  07/26/2019 10:27 AM PAM FINNIGAN  MRN:  [id number]  Chief Complaint:  Chief Complaint    Follow-up     HPI: Natalie [name] is a 68 year old African-American female, retired, currently lives with her daughter, has a history of psychosis, cognitive disorder, asthma, hypokalemia, was evaluated by phone today.  Patient preferred to do a phone call.  Patient today reports she is compliant on the risperidone.  She denies any side effects.  She reports she has not had any significant depressive symptoms or anxiety symptoms recently.  She reports she has been going out more.  She went to [facility] recently.  She reports sleep as okay however last night she did not sleep that good since according to her, her daughter was up and it may have been noisy in the house which affected her sleep.  Patient however denies any auditory hallucinations or visual hallucinations.  She did not appear to be preoccupied with any paranoia or delusions.  Patient reports appetite is  fair.  Patient was alert oriented to person place time and situation.  Patient reports she currently does not have any memory problems that she was able to answer writer's questions including calculation well.  She however has upcoming appointment with neurology for follow-up. Visit Diagnosis:    ICD-10-CM   1. Psychosis, unspecified psychosis type ([place])  F29    Resolved  2. Cognitive disorder  F09     Past Psychiatric History: I have reviewed past psychiatric history from my progress note on 03/25/2019.  Past trials of Invega Sustenna, risperidone.  Past Medical History:  Past Medical History:  Diagnosis Date  . Allergy   . Arthritis   . Asthma   . Cataract   . GERD (gastroesophageal reflux disease)    pt. denied  . Heart murmur    was told years ago  . History of shingles 2006  . Hyperlipidemia     Past Surgical History:  Procedure Laterality Date  . ABDOMINAL HYSTERECTOMY    . COLONOSCOPY    . POLYPECTOMY    . VAGINAL DELIVERY     x2    Family Psychiatric History: I have reviewed family psychiatric history from my progress note on 03/25/2019.   Family History:  Family History  Problem Relation Age of Onset  . Arthritis Mother   . Diabetes Mother   . Hypertension Mother   . Liver cancer Mother   . Colon cancer Father 61  . Pancreatic cancer Brother   . Colon polyps Brother   . Breast cancer Daughter 40  .  Alcohol abuse Other   . Heart disease Neg Hx   . Esophageal cancer Neg Hx   . Rectal cancer Neg Hx   . Stomach cancer Neg Hx     Social History: Reviewed social history from my progress note on 03/25/2019. Social History   Socioeconomic History  . Marital status: Single    Spouse name: Not on file  . Number of children: 2  . Years of education: Not on file  . Highest education level: Not on file  Occupational History  . 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 Smoking status: Never Smoker  .  Smokeless tobacco: Never Used  Substance and Sexual Activity  . Alcohol use: No  . Drug use: No  . Sexual activity: Not Currently  Other Topics Concern  . Not on file  Social History Narrative   No living will   Requests daughter Natalie [name] as health care POA   Would accept resuscitation but no prolonged machines   Not sure about tube feeds      Right handed      Currently lives with daughter      Completed HS   Social Determinants of Health   Financial Resource Strain: Low Risk   . Difficulty of Paying Living Expenses: Not very hard  Food Insecurity: Food Insecurity Present  . Worried About Charity fundraiser in the Last Year: Sometimes true  . Ran Out of Food in the Last Year: Sometimes true  Transportation Needs: Unmet Transportation Needs  . Lack of Transportation (Medical): Yes  . Lack of Transportation (Non-Medical): Yes  Physical Activity: Inactive  . Days of Exercise per Week: 0 days  . Minutes of Exercise per Session: 0 min  Stress:   . Feeling of Stress : Not on file  Social Connections: Unknown  . Frequency of Communication with Friends and Family: Not on file  . Frequency of Social Gatherings with Friends and Family: Not on file  . Attends Religious Services: Never  . Active Member of Clubs or Organizations: No  . Attends Archivist Meetings: Never  . Marital Status: Never married    Allergies: No Known Allergies  Metabolic Disorder Labs: No results found for: HGBA1C, MPG No results found for: PROLACTIN Lab Results  Component Value Date   CHOL 228 (H) 05/14/2018   TRIG 167.0 (H) 05/14/2018   HDL 48.60 05/14/2018   CHOLHDL 5 05/14/2018   VLDL 33.4 05/14/2018   LDLCALC 146 (H) 05/14/2018   LDLCALC 161 (H) 04/11/2017   Lab Results  Component Value Date   TSH 1.448 03/04/2019   TSH 0.952 04/04/2012    Therapeutic Level Labs: No results found for: LITHIUM No results found for: VALPROATE No components found for:  CBMZ  Current  Medications: Current Outpatient Medications  Medication Sig Dispense Refill  . albuterol (PROAIR HFA) 108 (90 Base) MCG/ACT inhaler Inhale 2 puffs into the lungs every 6 (six) hours as needed for wheezing or shortness of breath. 6.7 g 3  . cetirizine (ZYRTEC) 10 MG tablet Take 10 mg by mouth daily.      . risperiDONE (RISPERDAL) 0.25 MG tablet Take 1 tablet (0.25 mg total) by mouth at bedtime. For psychosis 30 tablet 1   No current facility-administered medications for this visit.     Musculoskeletal: Strength & Muscle Tone: UTA Gait & Station: Reports as WNL Patient leans: N/A  Psychiatric Specialty Exam: Review of Systems  Psychiatric/Behavioral: Negative for agitation, behavioral problems, confusion, decreased concentration, dysphoric mood, hallucinations, self-injury and sleep disturbance. The patient is not nervous/anxious and is not hyperactive.   All other systems reviewed and are negative.   There were no vitals taken for this visit.There is no height or weight on file to calculate BMI.  General Appearance: UTA  Eye Contact:  UTA  Speech:  Clear and Coherent  Volume:  Normal  Mood:  Euthymic  Affect:  UTA  Thought Process:  Goal Directed and Descriptions of Associations: Intact  Orientation:  Full (Time, Place, and Person)  Thought Content: Logical   Suicidal Thoughts:  No  Homicidal Thoughts:  No  Memory:  Immediate;   Fair Recent;   Fair Remote;   Fair  Judgement:  Fair  Insight:  Fair  Psychomotor Activity:  UTA  Concentration:  Concentration: Fair and Attention Span: Fair  Recall:  [employer] of Knowledge: Fair  Language: Fair  Akathisia:  No  Handed:  Right  AIMS (if indicated): denies tremors, rigidity  Assets:  Communication Skills Desire for Improvement Housing Social Support  ADL's:  Intact  Cognition: WNL  Sleep:  Fair   Screenings: PHQ2-9     Office Visit from 05/17/2019 in [place] at [facility] Visit from 04/11/2017 in  [place] at [facility] from 04/06/2016 in [place] at [facility] from 04/06/2015 in [place] at [facility] from 04/04/2014 in [place] at [facility] Total Score  0  0  0  0  0       Assessment and Plan: Natalie [name] is a 68 year old African-American female, single, retired, currently lives with her daughter Natalie [name], has a history of psychosis, cognitive problems, asthma, hypokalemia was evaluated by phone today.  Patient is currently stable on current medication regimen.  Plan as noted below.  Plan Psychosis-resolved Unknown what may have caused her psychosis, unknown if this is secondary to her cognitive issues. She will continue risperidone 0.25 mg p.o. nightly  Cognitive disorder-likely mild Patient will continue to work with neurologist.  Discussed with patient that we have received a fax from sleep medicine that they have been trying to reach her and that she has not responded.  Provided her the phone number for sleep clinic to reach out to them.  Follow-up in clinic in 6 weeks or sooner if needed.  February 4 at 2:20 pm  I have spent atleast 10 minutes non face to face with patient today. More than 50 % of the time was spent for psychoeducation and supportive psychotherapy and care coordination. This note was generated in part or whole with voice recognition software. Voice recognition is usually quite accurate but there are transcription errors that can and very often do occur. I apologize for any typographical errors that were not detected and corrected.       Ursula Alert, MD 07/26/2019, 10:27 AM

## 2019-08-26 ENCOUNTER — Telehealth: Payer: Self-pay

## 2019-08-26 DIAGNOSIS — F29 Unspecified psychosis not due to a substance or known physiological condition: Secondary | ICD-10-CM

## 2019-08-26 NOTE — Telephone Encounter (Signed)
pt called stated that she needed a refill pt states she has 4 tablets left.

## 2019-08-27 MED ORDER — RISPERIDONE 0.25 MG PO TABS: 0.2500 mg | ORAL_TABLET | Freq: Every day | ORAL | 1 refills | Status: DC

## 2019-08-27 NOTE — Telephone Encounter (Signed)
Sent Risperidone to pharmacy.

## 2019-09-12 ENCOUNTER — Other Ambulatory Visit: Payer: Self-pay

## 2019-09-12 ENCOUNTER — Ambulatory Visit (INDEPENDENT_AMBULATORY_CARE_PROVIDER_SITE_OTHER): Payer: Medicare Other | Admitting: Psychiatry

## 2019-09-12 ENCOUNTER — Encounter: Payer: Self-pay | Admitting: Psychiatry

## 2019-09-12 DIAGNOSIS — F29 Unspecified psychosis not due to a substance or known physiological condition: Secondary | ICD-10-CM

## 2019-09-12 DIAGNOSIS — G3184 Mild cognitive impairment, so stated: Secondary | ICD-10-CM | POA: Diagnosis not present

## 2019-09-12 DIAGNOSIS — F0670 Mild neurocognitive disorder due to known physiological condition without behavioral disturbance: Secondary | ICD-10-CM

## 2019-09-12 NOTE — Progress Notes (Signed)
Provider Location : [facility] Patient Location : Home   Virtual Visit via Telephone Note  I connected with Natalie [name] on 09/12/19 at  2:20 PM EST by telephone and verified that I am speaking with the correct person using two identifiers.   I discussed the limitations, risks, security and privacy concerns of performing an evaluation and management service by telephone and the availability of in person appointments. I also discussed with the patient that there may be a patient responsible charge related to this service. The patient expressed understanding and agreed to proceed.   I discussed the assessment and treatment plan with the patient. The patient was provided an opportunity to ask questions and all were answered. The patient agreed with the plan and demonstrated an understanding of the instructions.   The patient was advised to call back or seek an in-person evaluation if the symptoms worsen or if the condition fails to improve as anticipated.  [place] MD OP Progress Note  09/12/2019 4:55 PM Natalie [name]  MRN:  [id number]  Chief Complaint:  Chief Complaint    Follow-up     HPI: Natalie [name] is a 69 year old African-American female, retired, currently lives with her daughter, has a history of psychosis, mild neurocognitive disorder, asthma, hypokalemia was evaluated by phone today.  Patient preferred to do a phone call.  Patient today reports she is currently doing well on the current medication regimen.  She denies any significant depressive symptoms.  She denies any psychosis.  She did not seem to be preoccupied with any paranoia or delusions.  She was able to answer all questions appropriately.  She appeared to be alert oriented to person place time and situation.  She reports she was able to do house chores as well as watch TV.  She is able to focus better.  She is compliant on medications as prescribed.  She denies side effects.  She denies any other concerns today. Visit  Diagnosis:    ICD-10-CM   1. Mild neurocognitive disorder due to multiple etiologies  G31.84   2. Psychosis, unspecified psychosis type ([place])  F29    resolved , likely secondary to cognitive disorder    Past Psychiatric History: I have reviewed past psychiatric history from my progress note on 03/25/2019.  Past trials of Invega Sustenna, risperidone  Past Medical History:  Past Medical History:  Diagnosis Date  . Allergy   . Arthritis   . Asthma   . Cataract   . GERD (gastroesophageal reflux disease)    pt. denied  . Heart murmur    was told years ago  . History of shingles 2006  . Hyperlipidemia     Past Surgical History:  Procedure Laterality Date  . ABDOMINAL HYSTERECTOMY    . COLONOSCOPY    . POLYPECTOMY    . VAGINAL DELIVERY     x2    Family Psychiatric History: I have reviewed family psychiatric history from my progress note on 03/25/2019.  Family History:  Family History  Problem Relation Age of Onset  . Arthritis Mother   . Diabetes Mother   . Hypertension Mother   . Liver cancer Mother   . Colon cancer Father 41  . Pancreatic cancer Brother   . Colon polyps Brother   . Breast cancer Daughter 6  . Alcohol abuse Other   . Heart disease Neg Hx   . Esophageal cancer Neg Hx   . Rectal cancer Neg Hx   . Stomach cancer Neg Hx  Social History: Reviewed social history from my progress note on 03/25/2019. Social History   Socioeconomic History  . Marital status: Single    Spouse name: Not on file  . Number of children: 2  . Years of education: Not on file  . Highest education level: Not on file  Occupational History  . 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 Smoking status: Never Smoker  . Smokeless tobacco: Never Used  Substance and Sexual Activity  . Alcohol use: No  . Drug use: No  . Sexual activity: Not Currently  Other Topics Concern  . Not on file  Social History Narrative   No  living will   Requests daughter Natalie [name] as health care POA   Would accept resuscitation but no prolonged machines   Not sure about tube feeds      Right handed      Currently lives with daughter      Completed HS   Social Determinants of Health   Financial Resource Strain: Low Risk   . Difficulty of Paying Living Expenses: Not very hard  Food Insecurity: Food Insecurity Present  . Worried About Charity fundraiser in the Last Year: Sometimes true  . Ran Out of Food in the Last Year: Sometimes true  Transportation Needs: Unmet Transportation Needs  . Lack of Transportation (Medical): Yes  . Lack of Transportation (Non-Medical): Yes  Physical Activity: Inactive  . Days of Exercise per Week: 0 days  . Minutes of Exercise per Session: 0 min  Stress:   . Feeling of Stress : Not on file  Social Connections: Unknown  . Frequency of Communication with Friends and Family: Not on file  . Frequency of Social Gatherings with Friends and Family: Not on file  . Attends Religious Services: Never  . Active Member of Clubs or Organizations: No  . Attends Archivist Meetings: Never  . Marital Status: Never married    Allergies: No Known Allergies  Metabolic Disorder Labs: No results found for: HGBA1C, MPG No results found for: PROLACTIN Lab Results  Component Value Date   CHOL 228 (H) 05/14/2018   TRIG 167.0 (H) 05/14/2018   HDL 48.60 05/14/2018   CHOLHDL 5 05/14/2018   VLDL 33.4 05/14/2018   LDLCALC 146 (H) 05/14/2018   LDLCALC 161 (H) 04/11/2017   Lab Results  Component Value Date   TSH 1.448 03/04/2019   TSH 0.952 04/04/2012    Therapeutic Level Labs: No results found for: LITHIUM No results found for: VALPROATE No components found for:  CBMZ  Current Medications: Current Outpatient Medications  Medication Sig Dispense Refill  . albuterol (PROAIR HFA) 108 (90 Base) MCG/ACT inhaler Inhale 2 puffs into the lungs every 6 (six) hours as needed for wheezing or  shortness of breath. 6.7 g 3  . cetirizine (ZYRTEC) 10 MG tablet Take 10 mg by mouth daily.      . risperiDONE (RISPERDAL) 0.25 MG tablet Take 1 tablet (0.25 mg total) by mouth at bedtime. For psychosis 30 tablet 1   No current facility-administered medications for this visit.     Musculoskeletal: Strength & Muscle Tone: UTA Gait & Station: Reports as WNL Patient leans: N/A  Psychiatric Specialty Exam: Review of Systems  Psychiatric/Behavioral: Negative for agitation, behavioral problems, confusion, decreased concentration, dysphoric mood, hallucinations, self-injury, sleep disturbance and suicidal ideas. The patient is not nervous/anxious and is not hyperactive.   All other systems reviewed and are  negative.   There were no vitals taken for this visit.There is no height or weight on file to calculate BMI.  General Appearance: UTA  Eye Contact:  UTA  Speech:  Clear and Coherent  Volume:  Normal  Mood:  Euthymic  Affect:  UTA  Thought Process:  Goal Directed and Descriptions of Associations: Intact  Orientation:  Full (Time, Place, and Person)  Thought Content: Logical   Suicidal Thoughts:  No  Homicidal Thoughts:  No  Memory:  Immediate;   Fair Recent;   Fair Remote;   Fair  Judgement:  Fair  Insight:  Fair  Psychomotor Activity:  UTA  Concentration:  Concentration: Fair and Attention Span: Fair  Recall:  [employer] of Knowledge: Fair  Language: Fair  Akathisia:  No  Handed:  Right  AIMS (if indicated):does have cramps of hands , unknown if this is due to arthritis per report  Assets:  Communication Skills Desire for Improvement Housing Social Support  ADL's:  Intact  Cognition: WNL  Sleep:  Fair   Screenings: PHQ2-9     Office Visit from 05/17/2019 in [place] at [facility] from 04/11/2017 in [place] at [place] from 04/06/2016 in [place] at [place] from 04/06/2015 in [place]  at [facility] from 04/04/2014 in [place] at [facility] Total Score  0  0  0  0  0       Assessment and Plan: Natalie [name] is a 69 year old African-American female, single, retired, currently lives with her daughter Natalie [name], has a history of psychosis, cognitive problems, asthma, hypokalemia was evaluated by phone today.  Patient is currently stable on medications.  Plan as noted below.  Plan Mild neurocognitive disorder-she will continue to work with neurology.  Psychosis likely secondary to cognitive problems.-Resolved. We will continue risperidone 0.25 mg p.o. nightly for now.  Patient with cramps of her hand on and off however reports she does have arthritis which may be causing her symptoms.  Discussed with patient to reach out to her primary care provider.  Follow-up in clinic in 2-3 months or sooner if needed.  April 15 at 11:20 AM  I have spent atleast 15 minutes non face to face with patient today. More than 50 % of the time was spent for ordering medications and test ,psychoeducation and supportive psychotherapy and care coordination,as well as documenting clinical information in electronic health record. This note was generated in part or whole with voice recognition software. Voice recognition is usually quite accurate but there are transcription errors that can and very often do occur. I apologize for any typographical errors that were not detected and corrected.       Ursula Alert, MD 09/12/2019, 4:55 PM

## 2019-09-20 ENCOUNTER — Ambulatory Visit: Payer: Medicare Other

## 2019-09-22 ENCOUNTER — Ambulatory Visit: Payer: Medicare Other | Attending: Internal Medicine

## 2019-09-22 ENCOUNTER — Ambulatory Visit: Payer: Medicare Other

## 2019-09-22 DIAGNOSIS — Z23 Encounter for immunization: Secondary | ICD-10-CM

## 2019-09-22 NOTE — Progress Notes (Signed)
   Covid-19 Vaccination Clinic  Name:  Natalie [name]    MRN: [id number] DOB: [birth year]  09/22/2019  Ms. Natalie [name] was observed post Covid-19 immunization for 15 minutes without incidence. She was provided with Vaccine Information Sheet and instruction to access the V-Safe system.   Ms. Natalie [name] was instructed to call 911 with any severe reactions post vaccine: Marland Kitchen Difficulty breathing  . Swelling of your face and throat  . A fast heartbeat  . A bad rash all over your body  . Dizziness and weakness    Immunizations Administered    Name Date Dose VIS Date Route   Pfizer COVID-19 Vaccine 09/22/2019 11:01 AM 0.3 mL 07/19/2019 Intramuscular   Manufacturer: [place]   Lot: [id number]   [place]: [id number]

## 2019-10-23 ENCOUNTER — Other Ambulatory Visit: Payer: Self-pay | Admitting: Psychiatry

## 2019-10-23 ENCOUNTER — Ambulatory Visit: Payer: Medicare Other | Attending: Internal Medicine

## 2019-10-23 DIAGNOSIS — Z23 Encounter for immunization: Secondary | ICD-10-CM

## 2019-10-23 DIAGNOSIS — F29 Unspecified psychosis not due to a substance or known physiological condition: Secondary | ICD-10-CM

## 2019-10-23 NOTE — Progress Notes (Signed)
   Covid-19 Vaccination Clinic  Name:  Natalie [name]    MRN: [id number] DOB: 06/24/1951  10/23/2019  Natalie [name] was observed post Covid-19 immunization for 15 minutes without incident. She was provided with Vaccine Information Sheet and instruction to access the V-Safe system.   Natalie [name] was instructed to call 911 with any severe reactions post vaccine: Marland Kitchen Difficulty breathing  . Swelling of face and throat  . A fast heartbeat  . A bad rash all over body  . Dizziness and weakness   Immunizations Administered    Name Date Dose VIS Date Route   Pfizer COVID-19 Vaccine 10/23/2019 10:32 AM 0.3 mL 07/19/2019 Intramuscular   Manufacturer: [place]   Lot: [id number]   [place]: [id number]

## 2019-10-24 ENCOUNTER — Telehealth (HOSPITAL_COMMUNITY): Payer: Self-pay

## 2019-10-24 NOTE — Telephone Encounter (Signed)
Error

## 2019-11-15 ENCOUNTER — Telehealth: Payer: Self-pay

## 2019-11-15 ENCOUNTER — Telehealth: Payer: Self-pay | Admitting: Psychiatry

## 2019-11-15 DIAGNOSIS — F0670 Mild neurocognitive disorder due to known physiological condition without behavioral disturbance: Secondary | ICD-10-CM

## 2019-11-15 DIAGNOSIS — G3184 Mild cognitive impairment, so stated: Secondary | ICD-10-CM

## 2019-11-15 DIAGNOSIS — F29 Unspecified psychosis not due to a substance or known physiological condition: Secondary | ICD-10-CM

## 2019-11-15 MED ORDER — TRAZODONE HCL 50 MG PO TABS: 25.0000 mg | ORAL_TABLET | Freq: Every evening | ORAL | 1 refills | Status: DC | PRN

## 2019-11-15 MED ORDER — RISPERIDONE 0.5 MG PO TABS: 0.5000 mg | ORAL_TABLET | Freq: Every day | ORAL | 1 refills | Status: DC

## 2019-11-15 NOTE — Telephone Encounter (Signed)
Patient's daughter called regarding patient's Risperidone 0.5mg . She stated that patient was doing good until 2 days ago. She stated that patient is not sleeping and the same thing is happening as her last episode. Daughter stated that she would like to talk with the doctor regarding her mom's medication. Please review and advise. Thank you.

## 2019-11-15 NOTE — Telephone Encounter (Signed)
I already spoke to patient and daughter.

## 2019-11-15 NOTE — Telephone Encounter (Signed)
Return call to daughterGennie Alma reports that patient is not doing well since the past 3 to 4 days.  She reports there was a loud noise in the neighborhood few nights ago and ever since then patient has not been doing well.  She believes that was a shooting and now she thinks everybody is out to get her.  She is restless at night and not sleeping.  She appears to be anxious.  Writer attempted to talk to patient-patient appeared to be alert, oriented and was able to answer questions appropriately.  She does report sleep issues however did not appear to be preoccupied with any delusions and denied any perceptual disturbances.  She denied any suicidality, homicidality.  Will increase the risperidone to 0.5 mg at bedtime Add trazodone 25 to 50 mg p.o. nightly as needed  Discussed with daughter to contact neurology since she also has cognitive issues.  Discussed crisis plan.  Advised to take the patient to the nearest emergency department and discussed that she may need inpatient admission if she continues to decompensate.

## 2019-11-17 ENCOUNTER — Emergency Department (EMERGENCY_DEPARTMENT_HOSPITAL)
Admission: EM | Admit: 2019-11-17 | Discharge: 2019-11-18 | Disposition: A | Discharge status: Other Acute Inpatient Hospital | Payer: Medicare Other | Source: Home / Self Care | Attending: Emergency Medicine | Admitting: Emergency Medicine

## 2019-11-17 ENCOUNTER — Other Ambulatory Visit: Payer: Self-pay

## 2019-11-17 DIAGNOSIS — F23 Brief psychotic disorder: Secondary | ICD-10-CM | POA: Insufficient documentation

## 2019-11-17 DIAGNOSIS — F22 Delusional disorders: Secondary | ICD-10-CM

## 2019-11-17 DIAGNOSIS — J45909 Unspecified asthma, uncomplicated: Secondary | ICD-10-CM | POA: Insufficient documentation

## 2019-11-17 DIAGNOSIS — Z20822 Contact with and (suspected) exposure to covid-19: Secondary | ICD-10-CM | POA: Insufficient documentation

## 2019-11-17 DIAGNOSIS — F29 Unspecified psychosis not due to a substance or known physiological condition: Secondary | ICD-10-CM | POA: Diagnosis not present

## 2019-11-17 DIAGNOSIS — F209 Schizophrenia, unspecified: Secondary | ICD-10-CM | POA: Diagnosis present

## 2019-11-17 DIAGNOSIS — Z79899 Other long term (current) drug therapy: Secondary | ICD-10-CM | POA: Insufficient documentation

## 2019-11-17 DIAGNOSIS — Z008 Encounter for other general examination: Secondary | ICD-10-CM

## 2019-11-17 LAB — COMPREHENSIVE METABOLIC PANEL
ALT: 15 U/L (ref 0–44)
AST: 28 U/L (ref 15–41)
Albumin: 4.2 g/dL (ref 3.5–5.0)
Alkaline Phosphatase: 61 U/L (ref 38–126)
Anion gap: 9 (ref 5–15)
BUN: 16 mg/dL (ref 8–23)
CO2: 28 mmol/L (ref 22–32)
Calcium: 9.3 mg/dL (ref 8.9–10.3)
Chloride: 105 mmol/L (ref 98–111)
Creatinine, Ser: 1.27 mg/dL — ABNORMAL HIGH (ref 0.44–1.00)
GFR calc Af Amer: 50 mL/min — ABNORMAL LOW (ref >60)
GFR calc non Af Amer: 43 mL/min — ABNORMAL LOW (ref >60)
Glucose, Bld: 106 mg/dL — ABNORMAL HIGH (ref 70–99)
Potassium: 3.1 mmol/L — ABNORMAL LOW (ref 3.5–5.1)
Sodium: 142 mmol/L (ref 135–145)
Total Bilirubin: 1 mg/dL (ref 0.3–1.2)
Total Protein: 7.7 g/dL (ref 6.5–8.1)

## 2019-11-17 LAB — ACETAMINOPHEN LEVEL: Acetaminophen (Tylenol), Serum: <10 ug/mL — ABNORMAL LOW (ref 10–30)

## 2019-11-17 LAB — SALICYLATE LEVEL: Salicylate Lvl: <7 mg/dL — ABNORMAL LOW (ref 7.0–30.0)

## 2019-11-17 LAB — ETHANOL: Alcohol, Ethyl (B): <10 mg/dL (ref <10)

## 2019-11-17 LAB — URINE DRUG SCREEN, QUALITATIVE (ARMC ONLY)
Amphetamines, Ur Screen: NOT DETECTED
Barbiturates, Ur Screen: NOT DETECTED
Benzodiazepine, Ur Scrn: NOT DETECTED
Cannabinoid 50 Ng, Ur ~~LOC~~: NOT DETECTED
Cocaine Metabolite,Ur ~~LOC~~: NOT DETECTED
MDMA (Ecstasy)Ur Screen: NOT DETECTED
Methadone Scn, Ur: NOT DETECTED
Opiate, Ur Screen: NOT DETECTED
Phencyclidine (PCP) Ur S: NOT DETECTED
Tricyclic, Ur Screen: NOT DETECTED

## 2019-11-17 LAB — RESPIRATORY PANEL BY RT PCR (FLU A&B, COVID)
Influenza A by PCR: NEGATIVE
Influenza B by PCR: NEGATIVE
SARS Coronavirus 2 by RT PCR: NEGATIVE

## 2019-11-17 LAB — CBC
HCT: 45.1 % (ref 36.0–46.0)
Hemoglobin: 15 g/dL (ref 12.0–15.0)
MCH: 30.5 pg (ref 26.0–34.0)
MCHC: 33.3 g/dL (ref 30.0–36.0)
MCV: 91.7 fL (ref 80.0–100.0)
Platelets: 304 10*3/uL (ref 150–400)
RBC: 4.92 MIL/uL (ref 3.87–5.11)
RDW: 13 % (ref 11.5–15.5)
WBC: 7.3 10*3/uL (ref 4.0–10.5)
nRBC: 0 % (ref 0.0–0.2)

## 2019-11-17 MED ORDER — POTASSIUM CHLORIDE CRYS ER 20 MEQ PO TBCR
40.0000 meq | EXTENDED_RELEASE_TABLET | Freq: Once | ORAL | Status: AC
  Administered 2019-11-17: 40 meq via ORAL
  Filled 2019-11-17: qty 2

## 2019-11-17 MED ORDER — TRAZODONE HCL 50 MG PO TABS
50.0000 mg | ORAL_TABLET | Freq: Every evening | ORAL | Status: DC | PRN
  Administered 2019-11-17: 50 mg via ORAL
  Filled 2019-11-17: qty 1

## 2019-11-17 MED ORDER — RISPERIDONE 1 MG PO TABS
1.0000 mg | ORAL_TABLET | Freq: Every day | ORAL | Status: DC
  Administered 2019-11-17: 1 mg via ORAL
  Filled 2019-11-17: qty 1

## 2019-11-17 NOTE — ED Notes (Signed)
Pt refused to dress out, stated "I'll wait on my test results. I'll leave and come back." This RN and Brittney, EDT were in room attempting to dress pt out, daughter present. Pt became slightly agitated and continued to refuse to dress out.

## 2019-11-17 NOTE — BH Assessment (Signed)
Assessment Note  Natalie [name] is an 69 y.o. female who presents to the ER via her daughter due to recent changes in her behaviors and mood. Patient daughter reports, approximately a week ago, during the night, there was loud sound and it scared the patient. The daughter didn't hear but her neighbor heard it. Since then, the patient hasn't slept and believes people are trying kill her and her family. The daughter reached out to her outpatient provider and medications were adjusted but no improvement. Patient continues lack sleep and paranoia increased. Patient wakes up the daughter once every hour, thinking she either dead or someone is in the home trying to kill them.  During the interview, the patient was guarded and withdrawn. She was careful how she answered. When daughter provided Probation officer with a recording with the patient saying the things, she became upset and told the daughter she "wasn't supposed to have done that." Patient started asking to go to the bathroom and for something to drink in order to avoid answering further questioning.  Patient was inpatient with [facility] 02/2019 for similar presentation. Daughter states the patient is having some of the same symptoms and they are progression. She wants her to get help before it worsened.   Diagnosis: Psychosis  Past Medical History:  Past Medical History:  Diagnosis Date  . Allergy   . Arthritis   . Asthma   . Cataract   . GERD (gastroesophageal reflux disease)    pt. denied  . Heart murmur    was told years ago  . History of shingles 2006  . Hyperlipidemia     Past Surgical History:  Procedure Laterality Date  . ABDOMINAL HYSTERECTOMY    . COLONOSCOPY    . POLYPECTOMY    . VAGINAL DELIVERY     x2    Family History:  Family History  Problem Relation Age of Onset  . Arthritis Mother   . Diabetes Mother   . Hypertension Mother   . Liver cancer Mother   . Colon cancer Father 30  . Pancreatic cancer Brother    . Colon polyps Brother   . Breast cancer Daughter 76  . Alcohol abuse Other   . Heart disease Neg Hx   . Esophageal cancer Neg Hx   . Rectal cancer Neg Hx   . Stomach cancer Neg Hx     Social History:  reports that she has never smoked. She has never used smokeless tobacco. She reports that she does not drink alcohol or use drugs.  Additional Social History:  Alcohol / Drug Use Pain Medications: See PTA Prescriptions: See PTA Over the Counter: See PTA History of alcohol / drug use?: No history of alcohol / drug abuse Longest period of sobriety (when/how long): n/a  CIWA: CIWA-Ar BP: (!) 126/94 Pulse Rate: 84 COWS:    Allergies: No Known Allergies  Home Medications: (Not in a hospital admission)   OB/GYN Status:  No LMP recorded. Patient has had a hysterectomy.  General Assessment Data Location of Assessment: [facility] ED TTS Assessment: In system Is this a Tele or Face-to-Face Assessment?: Face-to-Face Is this an Initial Assessment or a Re-assessment for this encounter?: Initial Assessment Patient Accompanied by:: Other(Daughter) Language Other than English: No Living Arrangements: Other (Comment)(Lives with the daughter) What gender do you identify as?: Female Marital status: Single Pregnancy Status: No Living Arrangements: Children, Other relatives Can pt return to current living arrangement?: Yes Admission Status: Involuntary Petitioner: ED Attending Is patient capable of  signing voluntary admission?: No(Under IVC) Referral Source: Self/Family/Friend Insurance type: [facility] Medicare  Medical Screening Exam [facility] Walk-in ONLY) Medical Exam completed: Yes  Crisis Care Plan Living Arrangements: Children, Other relatives Legal Guardian: Other:(Self) Name of Psychiatrist: Dr. [name], [facility] Name of Therapist: None  Education Status Is patient currently in school?: No Is the patient employed, unemployed or receiving disability?: Unemployed,  Receiving disability income  Risk to self with the past 6 months Suicidal Ideation: No Has patient been a risk to self within the past 6 months prior to admission? : No Suicidal Intent: No Has patient had any suicidal intent within the past 6 months prior to admission? : No Is patient at risk for suicide?: No Suicidal Plan?: No Has patient had any suicidal plan within the past 6 months prior to admission? : No Access to Means: No What has been your use of drugs/alcohol within the last 12 months?: Reports of none Previous Attempts/Gestures: No How many times?: 0 Other Self Harm Risks: Reports of none Triggers for Past Attempts: None known Intentional Self Injurious Behavior: None Family Suicide History: Unknown Recent stressful life event(s): Other (Comment) Persecutory voices/beliefs?: No Depression: Yes Depression Symptoms: Insomnia, Feeling worthless/self pity, Isolating, Feeling angry/irritable Substance abuse history and/or treatment for substance abuse?: No Suicide prevention information given to non-admitted patients: Not applicable  Risk to Others within the past 6 months Homicidal Ideation: No Does patient have any lifetime risk of violence toward others beyond the six months prior to admission? : No Thoughts of Harm to Others: No Current Homicidal Intent: No Current Homicidal Plan: No Access to Homicidal Means: No Identified Victim: Reports of none History of harm to others?: No Assessment of Violence: None Noted Violent Behavior Description: Reports of none Does patient have access to weapons?: No Criminal Charges Pending?: No Does patient have a court date: No Is patient on probation?: No  Psychosis Hallucinations: Auditory Delusions: None noted  Mental Status Report Appearance/Hygiene: Unremarkable, In scrubs Eye Contact: Fair Motor Activity: Freedom of movement, Unremarkable Speech: Logical/coherent, Unremarkable Level of Consciousness: Alert Mood:  Depressed, Anxious, Fearful, Pleasant Affect: Appropriate to circumstance, Anxious Anxiety Level: Moderate Thought Processes: Coherent, Relevant Judgement: Partial Orientation: Person, Place, Time, Situation, Appropriate for developmental age Obsessive Compulsive Thoughts/Behaviors: Minimal  Cognitive Functioning Concentration: Normal Memory: Recent Intact, Remote Intact Is patient IDD: No Insight: Fair Impulse Control: Fair Appetite: Good Have you had any weight changes? : No Change Sleep: Decreased Total Hours of Sleep: 1(Per the daughter) Vegetative Symptoms: None  ADLScreening [facility] Assessment Services) Patient's cognitive ability adequate to safely complete daily activities?: Yes Patient able to express need for assistance with ADLs?: Yes Independently performs ADLs?: Yes (appropriate for developmental age)  Prior Inpatient Therapy Prior Inpatient Therapy: Yes Prior Therapy Dates: 02/2019 Prior Therapy Facilty/Provider(s[facility] Reason for Treatment: Psychosis  Prior Outpatient Therapy Prior Outpatient Therapy: Yes Prior Therapy Dates: Current Prior Therapy Facilty/Provider(s): [facility] Reason for Treatment: Psychosis Does patient have an ACCT team?: No Does patient have Intensive In-House Services?  : No Does patient have [facility] services? : No Does patient have P4CC services?: No  ADL Screening (condition at time of admission) Patient's cognitive ability adequate to safely complete daily activities?: Yes Is the patient deaf or have difficulty hearing?: No Does the patient have difficulty seeing, even when wearing glasses/contacts?: No Does the patient have difficulty concentrating, remembering, or making decisions?: No Patient able to express need for assistance with ADLs?: Yes Does the patient have  difficulty dressing or bathing?: No Independently performs ADLs?: Yes (appropriate for developmental age) Does the  patient have difficulty walking or climbing stairs?: No Weakness of Legs: None Weakness of Arms/Hands: None  Home Assistive Devices/Equipment Home Assistive Devices/Equipment: None  Therapy Consults (therapy consults require a physician order) PT Evaluation Needed: No OT Evalulation Needed: No SLP Evaluation Needed: No Abuse/Neglect Assessment (Assessment to be complete while patient is alone) Abuse/Neglect Assessment Can Be Completed: Yes Physical Abuse: Denies Verbal Abuse: Denies Sexual Abuse: Denies Exploitation of patient/patient's resources: Denies Self-Neglect: Denies Values / Beliefs Cultural Requests During Hospitalization: None Spiritual Requests During Hospitalization: None Consults Spiritual Care Consult Needed: No Transition of Care Team Consult Needed: No Advance Directives (For Healthcare) Does Patient Have a Medical Advance Directive?: No Would patient like information on creating a medical advance directive?: No - Patient declined  Child/Adolescent Assessment Running Away Risk: Denies(Patient is an adult)  Disposition:  Disposition Initial Assessment Completed for this Encounter: Yes  On Site Evaluation by:   Reviewed with Physician:    Gunnar Fusi MS, LCAS, [facility], [place] Therapeutic Triage Specialist 11/17/2019 7:23 PM

## 2019-11-17 NOTE — ED Notes (Signed)
Family at bedside. Daughter attempted to assist pt to change to purple scrubs. Unable at this moment.

## 2019-11-17 NOTE — Consult Note (Signed)
[facility] Face-to-Face Psychiatry Consult   Reason for Consult:  Paranoia, delusions,  Referring Physician:  EDP Patient Identification: Natalie [name] MRN:  [id number] Principal Diagnosis: Psychosis ([place]) Diagnosis:  Principal Problem:   Psychosis ([place])   Total Time spent with patient: 30 minutes  Subjective:   Natalie [name] is a 69 y.o. female patient reports that she is doing fine.  Patient reports that she has not had any issues at home.  She does admit to having some difficulty with sleeping and states that she has been sleeping fine recently.  She states that she did contact Dr. [name] for some additional help and they changed her medications but that was only a couple days ago and the medication needs more time to work for her.  Patient does report getting up during the night and going to her daughter's room.  The patient reports that she came to the hospital simply to get tested for drugs and when asked why she thought she had drugs in her system she stated that she got a drink from a restaurant the other day and after she had to drink she felt sleepy.  Patient's daughter, Natalie [name], is in the room with patient.  She reports that for several days the patient has about been sleeping well.  She states since approximately Tuesday she has been up all night.  She states the patient has been coming into her room all night approximately every hour to hour and half and standing over the bed.  She reports that the patient has been extremely paranoid and videotaping anyone they drive by or anyone that walks past her house.  She also reports the patient is stated she does not feel safe to return to her own home and that they need to stay out of the house.  She reports that the patient has been concerned about food and being poisoned and talking about needing to get the antidote.  She reports that the patient has continually progressed with worsening delusional and paranoia.  She states that the patient  is prescribed Risperdal 0.5 mg p.o. nightly and trazodone 100 mg p.o. nightly and these were started on approximately Wednesday or Thursday.  She states that the patient has not seemed to rest at all even with the increased medications.  She reports that the patient was on Risperdal 1 mg p.o. nightly but that seemed a little excessive at the time and so the medication was tapered back to 0.25 and then increased to 0.5.  The patient's daughter confronted her mother about the things that she had reported and the patient immediately started avoiding the conversation and drinking her drink excessively and asking to go to the bathroom to avoid answering the questions.  Daughter also reports that the patient has her own apartment and [place] that she has continued to pay rent a power on, however the patient has been too paranoid and concerned about being harmed if she returns there that she has remained living with her daughter.  HPI:  Per EDP: 69 y.o. female with hyperlipidemia who comes in with daughter.  Daughter states patient takes Risperdal every afternoon.  Patient has been pacing and acting more paranoid.  According to daughter she states that she has not been sleeping at nighttime.  She has been paranoid stating that people were to come to the home and kill them.  She also while driving open the car door and stated that she was just looking around.  Daughter is concerned about  these new changes.  She reports patient is had a prior psychiatric hospital admission is being seen by [place] psychiatrist.  [facility] went [facility] on the Risperdal and started trazodone but has not helped at all with the sleep.  She denies any fevers, abdominal pain, chest pain, shortness of breath or other concerns.  Patient is seen by this provider via face-to-face.  With collateral from daughter it appears that the patient is having worsening psychotic symptoms of paranoia and delusional thoughts.  Patient appears to be thought blocking and  avoiding conversation about the thoughts that she is having.  Patient's daughter reports that she is still continued to take a bath and get herself dressed and eat but that she is not been sleeping and her psychosis has worsened.  At this time with the patient showing worsening symptoms of psychosis feel that inpatient psychiatric treatment as needed.  The patient has been admitted to [facility] in the past.  Patient is under IVC and IVC will remain.  TTS we will seek placement for this patient.  Past Psychiatric History: Psychosis reportedly diagnosed in July 2020  Risk to Self:   Risk to Others:   Prior Inpatient Therapy:   Prior Outpatient Therapy:    Past Medical History:  Past Medical History:  Diagnosis Date  . Allergy   . Arthritis   . Asthma   . Cataract   . GERD (gastroesophageal reflux disease)    pt. denied  . Heart murmur    was told years ago  . History of shingles 2006  . Hyperlipidemia     Past Surgical History:  Procedure Laterality Date  . ABDOMINAL HYSTERECTOMY    . COLONOSCOPY    . POLYPECTOMY    . VAGINAL DELIVERY     x2   Family History:  Family History  Problem Relation Age of Onset  . Arthritis Mother   . Diabetes Mother   . Hypertension Mother   . Liver cancer Mother   . Colon cancer Father 59  . Pancreatic cancer Brother   . Colon polyps Brother   . Breast cancer Daughter 35  . Alcohol abuse Other   . Heart disease Neg Hx   . Esophageal cancer Neg Hx   . Rectal cancer Neg Hx   . Stomach cancer Neg Hx    Family Psychiatric  History: None reported Social History:  Social History   Substance and Sexual Activity  Alcohol Use No     Social History   Substance and Sexual Activity  Drug Use No    Social History   Socioeconomic History  . Marital status: Single    Spouse name: Not on file  . Number of children: 2  . Years of education: Not on file  . Highest education level: Not on file  Occupational History  . 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 Smoking status: Never Smoker  . Smokeless tobacco: Never Used  Substance and Sexual Activity  . Alcohol use: No  . Drug use: No  . Sexual activity: Not Currently  Other Topics Concern  . Not on file  Social History Narrative   No living will   Requests daughter Natalie [name] as health care POA   Would accept resuscitation but no prolonged machines   Not sure about tube feeds      Right handed      Currently lives with daughter      Completed  HS   Social Determinants of Health   Financial Resource Strain: Low Risk   . Difficulty of Paying Living Expenses: Not very hard  Food Insecurity: Food Insecurity Present  . Worried About Charity fundraiser in the Last Year: Sometimes true  . Ran Out of Food in the Last Year: Sometimes true  Transportation Needs: Unmet Transportation Needs  . Lack of Transportation (Medical): Yes  . Lack of Transportation (Non-Medical): Yes  Physical Activity: Inactive  . Days of Exercise per Week: 0 days  . Minutes of Exercise per Session: 0 min  Stress:   . Feeling of Stress :   Social Connections: Unknown  . Frequency of Communication with Friends and Family: Not on file  . Frequency of Social Gatherings with Friends and Family: Not on file  . Attends Religious Services: Never  . Active Member of Clubs or Organizations: No  . Attends Archivist Meetings: Never  . Marital Status: Never married   Additional Social History:    Allergies:  No Known Allergies  Labs:  Results for orders placed or performed during the hospital encounter of 11/17/19 (from the past 48 hour(s))  Comprehensive metabolic panel     Status: Abnormal   Collection Time: 11/17/19  3:31 PM  Result Value Ref Range   Sodium 142 135 - 145 mmol/L   Potassium 3.1 (L) 3.5 - 5.1 mmol/L   Chloride 105 98 - 111 mmol/L   CO2 28 22 - 32 mmol/L   Glucose, Bld 106 (H) 70 - 99 mg/dL    Comment:  Glucose reference range applies only to samples taken after fasting for at least 8 hours.   BUN 16 8 - 23 mg/dL   Creatinine, Ser 1.27 (H) 0.44 - 1.00 mg/dL   Calcium 9.3 8.9 - 10.3 mg/dL   Total Protein 7.7 6.5 - 8.1 g/dL   Albumin 4.2 3.5 - 5.0 g/dL   AST 28 15 - 41 U/L   ALT 15 0 - 44 U/L   Alkaline Phosphatase 61 38 - 126 U/L   Total Bilirubin 1.0 0.3 - 1.2 mg/dL   GFR calc non Af Amer 43 (L) >60 mL/min   GFR calc Af Amer 50 (L) >60 mL/min   Anion gap 9 5 - 15    Comment: Performed at [facility], [address] [place], [place] [address]  CBC     Status: None   Collection Time: 11/17/19  3:31 PM  Result Value Ref Range   WBC 7.3 4.0 - 10.5 K/uL   RBC 4.92 3.87 - 5.11 MIL/uL   Hemoglobin 15.0 12.0 - 15.0 g/dL   HCT 45.1 36.0 - 46.0 %   MCV 91.7 80.0 - 100.0 fL   MCH 30.5 26.0 - 34.0 pg   MCHC 33.3 30.0 - 36.0 g/dL   RDW 13.0 11.5 - 15.5 %   Platelets 304 150 - 400 K/uL   nRBC 0.0 0.0 - 0.2 %    Comment: Performed at [facility], [address] [place], [place] [address]  Ethanol     Status: None   Collection Time: 11/17/19  3:31 PM  Result Value Ref Range   Alcohol, Ethyl (B) <10 <10 mg/dL    Comment: (NOTE) Lowest detectable limit for serum alcohol is 10 mg/dL. For medical purposes only. Performed at [facility], [address], [place], [place] [id number]   Salicylate level     Status: Abnormal   Collection Time: 11/17/19  3:31 PM  Result Value Ref Range   Salicylate Lvl [id number] (L) 7.0 - 30.0 mg/dL    Comment: Performed at [facility], [address] [place], [place] [address]  Acetaminophen level     Status: Abnormal   Collection Time: 11/17/19  3:31 PM  Result Value Ref Range   Acetaminophen (Tylenol), Serum <10 (L) 10 - 30 ug/mL    Comment: (NOTE) Therapeutic concentrations vary significantly. A range of 10-30 ug/mL  may be an effective concentration for many patients. However, some  are best treated at  concentrations outside of this range. Acetaminophen concentrations >150 ug/mL at 4 hours after ingestion  and >50 ug/mL at 12 hours after ingestion are often associated with  toxic reactions. Performed at [facility], [address] [place], [place] [address]   Urine Drug Screen, Qualitative     Status: None   Collection Time: 11/17/19  3:31 PM  Result Value Ref Range   Tricyclic, Ur Screen NONE DETECTED NONE DETECTED   Amphetamines, Ur Screen NONE DETECTED NONE DETECTED   MDMA (Ecstasy)Ur Screen NONE DETECTED NONE DETECTED   Cocaine Metabolite,Ur [place] NONE DETECTED NONE DETECTED   Opiate, Ur Screen NONE DETECTED NONE DETECTED   Phencyclidine (PCP) Ur S NONE DETECTED NONE DETECTED   Cannabinoid 50 Ng, Ur [place] NONE DETECTED NONE DETECTED   Barbiturates, Ur Screen NONE DETECTED NONE DETECTED   Benzodiazepine, Ur Scrn NONE DETECTED NONE DETECTED   Methadone Scn, Ur NONE DETECTED NONE DETECTED    Comment: (NOTE) Tricyclics + metabolites, urine    Cutoff 1000 ng/mL Amphetamines + metabolites, urine  Cutoff 1000 ng/mL MDMA (Ecstasy), urine              Cutoff 500 ng/mL Cocaine Metabolite, urine          Cutoff 300 ng/mL Opiate + metabolites, urine        Cutoff 300 ng/mL Phencyclidine (PCP), urine         Cutoff 25 ng/mL Cannabinoid, urine                 Cutoff 50 ng/mL Barbiturates + metabolites, urine  Cutoff 200 ng/mL Benzodiazepine, urine              Cutoff 200 ng/mL Methadone, urine                   Cutoff 300 ng/mL The urine drug screen provides only a preliminary, unconfirmed analytical test result and should not be used for non-medical purposes. Clinical consideration and professional judgment should be applied to any positive drug screen result due to possible interfering substances. A more specific alternate chemical method must be used in order to obtain a confirmed analytical result. Gas chromatography / mass spectrometry (GC/MS) is the preferred confirmat ory  method. Performed at [facility], [address] [place], [place] [address]     Current Facility-Administered Medications  Medication Dose Route Frequency Provider Last Rate Last Admin  . potassium chloride SA (KLOR-CON) CR tablet 40 mEq  40 mEq Oral Once Vanessa Weldona, MD      . risperiDONE (RISPERDAL) tablet 1 mg  1 mg Oral QHS Johnnye Sandford B, FNP      . traZODone (DESYREL) tablet 50 mg  50 mg Oral QHS PRN Sayla Golonka, Lowry Ram, FNP       Current Outpatient Medications  Medication Sig Dispense Refill  . albuterol (PROAIR HFA) 108 (90 Base) MCG/ACT inhaler Inhale 2 puffs into the lungs  every 6 (six) hours as needed for wheezing or shortness of breath. 6.7 g 3  . cetirizine (ZYRTEC) 10 MG tablet Take 10 mg by mouth daily.      . risperiDONE (RISPERDAL) 0.5 MG tablet Take 1 tablet (0.5 mg total) by mouth at bedtime. 30 tablet 1  . traZODone (DESYREL) 50 MG tablet Take 0.5-1 tablets (25-50 mg total) by mouth at bedtime as needed for sleep. 30 tablet 1    Musculoskeletal: Strength & Muscle Tone: within normal limits Gait & Station: normal Patient leans: N/A  Psychiatric Specialty Exam: Physical Exam  Nursing note and vitals reviewed. Constitutional: She is oriented to person, place, and time. She appears well-developed and well-nourished.  Cardiovascular: Normal rate.  Respiratory: Effort normal.  Musculoskeletal:        General: Normal range of motion.  Neurological: She is alert and oriented to person, place, and time.  Skin: Skin is warm.  Psychiatric: Her mood appears anxious. Thought content is paranoid and delusional. She expresses inappropriate judgment.    Review of Systems  Constitutional: Negative.   HENT: Negative.   Eyes: Negative.   Respiratory: Negative.   Cardiovascular: Negative.   Gastrointestinal: Negative.   Genitourinary: Negative.   Musculoskeletal: Negative.   Skin: Negative.   Neurological: Negative.   Psychiatric/Behavioral: Positive for  sleep disturbance.       Paranoia    Blood pressure (!) 126/94, pulse 84, temperature 99.6 F (37.6 C), temperature source Oral, resp. rate 18, height 5\' 3"  (1.6 m), weight 88.9 kg, SpO2 97 %.Body mass index is 34.72 kg/m.  General Appearance: Casual and Guarded  Eye Contact:  Minimal  Speech:  Clear and Coherent and Normal Rate  Volume:  Decreased  Mood:  Anxious  Affect:  Constricted  Thought Process:  Disorganized and Irrelevant  Orientation:  Full (Time, Place, and Person)  Thought Content:  Delusions, Paranoid Ideation, Rumination and Tangential  Suicidal Thoughts:  No  Homicidal Thoughts:  No  Memory:  Immediate;   Fair Recent;   Fair Remote;   Fair  Judgement:  Impaired  Insight:  Lacking  Psychomotor Activity:  Decreased  Concentration:  Concentration: Fair  Recall:  [employer] of Knowledge:  Fair  Language:  Fair  Akathisia:  No  Handed:  Right  AIMS (if indicated):     Assets:  Financial Resources/Insurance Housing Resilience Social Support  ADL's:  Intact  Cognition:  WNL  Sleep:        Treatment Plan Summary: Medication management and Start Risperdal 1 mg PO QHS  Restart Trazodone 50 mg PO QHS Obtain EKG  Disposition: Recommend geri-psychiatric Inpatient admission when medically cleared.  [place], FNP 11/17/2019 6:22 PM

## 2019-11-17 NOTE — ED Notes (Signed)
Pt transferred into ED BHU room 5  Patient assigned to appropriate care area. Patient oriented to unit/care area: Informed that, for her safety, care areas are designed for safety and monitored by security cameras at all times; Visiting hours and phone times explained to patient. Patient verbalizes understanding, and verbal contract for safety obtained.   Assessment completed  She denies pain

## 2019-11-17 NOTE — ED Notes (Signed)
Pt dressed out into hospital attire. Pt's belongings to include: Pt's green watch Pt's wallet Black shirt Black pants 2 black socks 1 pair of underwear 1 white bra 1 hair tie  All belongings given to daughter to take home.

## 2019-11-17 NOTE — ED Triage Notes (Signed)
Daughter with pt, lives with pt. Daughter states takes Risperdal every afternoon. States counted pills and none missing. States hx of hallucinations. Tuesday started AMS. States pt has been pacing and stating that she doesn't feel safe. Daughter states hx of inpatient psych. Daughter states pt has been paranoid this week, thinking everyone is watching her. No dementia hx. Unsure of UTI hx. Daughter states Dr. Thomasena Edis prescribed pt haldol and trazadonel this week. Pt hasn't slept. Pt ambulatory. Not really looking at this RN.

## 2019-11-17 NOTE — ED Provider Notes (Signed)
[facility] Emergency Department Provider Note  ____________________________________________   First MD Initiated Contact with Patient 11/17/19 1612     (approximate)  I have reviewed the triage vital signs and the nursing notes.   HISTORY  Chief Complaint Altered Mental Status    HPI BRODIE VOISINE is a 69 y.o. female with hyperlipidemia who comes in with daughter.  Daughter states patient takes Risperdal every afternoon.  Patient has been pacing and acting more paranoid.  According to daughter she states that she has not been sleeping at nighttime.  She has been paranoid stating that people were to come to the home and kill them.  She also while driving open the car door and stated that she was just looking around.  Daughter is concerned about these new changes.  She reports patient is had a prior psychiatric hospital admission is being seen by [place] psychiatrist.  [facility] went up on the Risperdal and started trazodone but has not helped at all with the sleep.  She denies any fevers, abdominal pain, chest pain, shortness of breath or other concerns.            Past Medical History:  Diagnosis Date  . Allergy   . Arthritis   . Asthma   . Cataract   . GERD (gastroesophageal reflux disease)    pt. denied  . Heart murmur    was told years ago  . History of shingles 2006  . Hyperlipidemia     Patient Active Problem List   Diagnosis Date Noted  . Hypokalemia 05/17/2019  . Insomnia 04/08/2019  . Mild neurocognitive disorder due to multiple etiologies 03/25/2019  . Psychosis ([place])   . Cognitive changes 03/01/2019  . Moderate persistent asthma with exacerbation 09/04/2018  . Other sleep apnea 04/11/2017  . Advanced directives, counseling/discussion 04/04/2014  . Osteoarthritis, multiple sites 04/04/2012  . Routine general medical examination at a health care facility 03/23/2011  . Hyperlipemia 02/11/2009  . Allergic rhinitis due to pollen  02/11/2009  . Mild intermittent asthma 02/11/2009  . GERD 02/11/2009    Past Surgical History:  Procedure Laterality Date  . ABDOMINAL HYSTERECTOMY    . COLONOSCOPY    . POLYPECTOMY    . VAGINAL DELIVERY     x2    Prior to Admission medications   Medication Sig Start Date End Date Taking? Authorizing Provider  albuterol (PROAIR HFA) 108 (90 Base) MCG/ACT inhaler Inhale 2 puffs into the lungs every 6 (six) hours as needed for wheezing or shortness of breath. 03/01/19   Venia Carbon, MD  cetirizine (ZYRTEC) 10 MG tablet Take 10 mg by mouth daily.      [provider]  risperiDONE (RISPERDAL) 0.5 MG tablet Take 1 tablet (0.5 mg total) by mouth at bedtime. 11/15/19   Ursula Alert, MD  traZODone (DESYREL) 50 MG tablet Take 0.5-1 tablets (25-50 mg total) by mouth at bedtime as needed for sleep. 11/15/19   Ursula Alert, MD    Allergies Patient has no known allergies.  Family History  Problem Relation Age of Onset  . Arthritis Mother   . Diabetes Mother   . Hypertension Mother   . Liver cancer Mother   . Colon cancer Father 14  . Pancreatic cancer Brother   . Colon polyps Brother   . Breast cancer Daughter 34  . Alcohol abuse Other   . Heart disease Neg Hx   . Esophageal cancer Neg Hx   . Rectal cancer Neg Hx   .  Stomach cancer Neg Hx     Social History Social History   Tobacco Use  . Smoking status: Never Smoker  . Smokeless tobacco: Never Used  Substance Use Topics  . Alcohol use: No  . Drug use: No      Review of Systems Constitutional: No fever/chills Eyes: No visual changes. ENT: No sore throat. Cardiovascular: Denies chest pain. Respiratory: Denies shortness of breath. Gastrointestinal: No abdominal pain.  No nausea, no vomiting.  No diarrhea.  No constipation. Genitourinary: Negative for dysuria. Musculoskeletal: Negative for back pain. Skin: Negative for rash. Neurological: Negative for headaches, focal weakness or numbness. Psych  concern for  not acting her normal self, paranoia.  Patient self denies any SI, HI All other ROS negative ____________________________________________   PHYSICAL EXAM:  VITAL SIGNS: ED Triage Vitals  Enc Vitals Group     BP 11/17/19 1529 (!) 150/77     Pulse Rate 11/17/19 1529 (!) 120     Resp 11/17/19 1529 18     Temp 11/17/19 1529 99.6 F (37.6 C)     Temp Source 11/17/19 1529 Oral     SpO2 11/17/19 1529 95 %     Weight 11/17/19 1530 196 lb (88.9 kg)     Height 11/17/19 1530 5\' 3"  (1.6 m)     Head Circumference --      Peak Flow --      Pain Score 11/17/19 1604 0     Pain Loc --      Pain Edu? --      Excl. in [place]? --     Constitutional: Alert and oriented. Well appearing and in no acute distress. Eyes: Conjunctivae are normal. EOMI. Head: Atraumatic. Nose: No congestion/rhinnorhea. Mouth/Throat: Mucous membranes are moist.   Neck: No stridor. Trachea Midline. FROM Cardiovascular: Normal rate, regular rhythm. Grossly normal heart sounds.  Good peripheral circulation. Respiratory: Normal respiratory effort.  No retractions. Lungs CTAB. Gastrointestinal: Soft and nontender. No distention. No abdominal bruits.  Musculoskeletal: No lower extremity tenderness nor edema.  No joint effusions. Neurologic:  Normal speech and language. No gross focal neurologic deficits are appreciated.  Skin:  Skin is warm, dry and intact. No rash noted. Psychiatric: Concerns for paranoia not acting her normal self. GU: Deferred   ____________________________________________   LABS (all labs ordered are listed, but only abnormal results are displayed)  Labs Reviewed  CBC  ETHANOL  COMPREHENSIVE METABOLIC PANEL  SALICYLATE LEVEL  ACETAMINOPHEN LEVEL  URINE DRUG SCREEN, QUALITATIVE ([place] ONLY)  URINALYSIS, COMPLETE (UACMP) WITH MICROSCOPIC  CBG MONITORING, ED   ____________________________________________   PROCEDURES  Procedure(s) performed (including Critical  Care):  Procedures   ____________________________________________   INITIAL IMPRESSION / ASSESSMENT AND PLAN / ED COURSE  GEORGIE BUTTS was evaluated in Emergency Department on 11/17/2019 for the symptoms described in the history of present illness. She was evaluated in the context of the global COVID-19 pandemic, which necessitated consideration that the patient might be at risk for infection with the SARS-CoV-2 virus that causes COVID-19. Institutional protocols and algorithms that pertain to the evaluation of patients at risk for COVID-19 are in a state of rapid change based on information released by regulatory bodies including the CDC and federal and state organizations. These policies and algorithms were followed during the patient's care in the ED.    Patient comes in with no acting her normal self and having paranoia.  No falls to suggest intracranial hemorrhage.  Patient has had prior psychiatric episodes in the past  and not getting better with increasing her medications.   Pt is without any acute medical complaints. No exam findings to suggest medical cause of current presentation. Will order psychiatric screening labs and discuss further w/ psychiatric service.  D/d includes but is not limited to psychiatric disease, behavioral/personality disorder, inadequate socioeconomic support, medical.  Based on HPI, exam, unremarkable labs, no concern for acute medical problem at this time. No rigidity, clonus, hyperthermia, focal neurologic deficit, diaphoresis, tachycardia, meningismus, ataxia, gait abnormality or other finding to suggest this visit represents a non-psychiatric problem. Screening labs reviewed.    Given this, pt medically cleared, to be dispositioned per Psych.  Potassium was slightly low.  We will give some oral repletion.  Labs otherwise are reassuring.  Still pending UA.  Patient was IVC given daughter's concern and patient herself not wanting to stay for  evaluation.  The patient has been placed in psychiatric observation due to the need to provide a safe environment for the patient while obtaining psychiatric consultation and evaluation, as well as ongoing medical and medication management to treat the patient's condition.  The patient has been placed under full IVC at this time.          ____________________________________________   FINAL CLINICAL IMPRESSION(S) / ED DIAGNOSES   Final diagnoses:  Paranoia ([place])  Patient needs psychiatric hold for evaluation      MEDICATIONS GIVEN DURING THIS VISIT:  Medications - No data to display   ED Discharge Orders    None       Note:  This document was prepared using Dragon voice recognition software and may include unintentional dictation errors.   Vanessa Mora, MD 11/17/19 [id number]

## 2019-11-18 ENCOUNTER — Other Ambulatory Visit: Payer: Self-pay

## 2019-11-18 ENCOUNTER — Inpatient Hospital Stay
Admission: AD | Admit: 2019-11-18 | Discharge: 2019-11-19 | DRG: 885 | Disposition: A | Discharge status: Home / Self Care | Payer: Medicare Other | Attending: Psychiatry | Admitting: Psychiatry

## 2019-11-18 ENCOUNTER — Encounter: Payer: Self-pay | Admitting: Psychiatry

## 2019-11-18 DIAGNOSIS — E785 Hyperlipidemia, unspecified: Secondary | ICD-10-CM | POA: Diagnosis present

## 2019-11-18 DIAGNOSIS — Z8249 Family history of ischemic heart disease and other diseases of the circulatory system: Secondary | ICD-10-CM | POA: Diagnosis not present

## 2019-11-18 DIAGNOSIS — Z833 Family history of diabetes mellitus: Secondary | ICD-10-CM

## 2019-11-18 DIAGNOSIS — Z20822 Contact with and (suspected) exposure to covid-19: Secondary | ICD-10-CM | POA: Diagnosis present

## 2019-11-18 DIAGNOSIS — Z79899 Other long term (current) drug therapy: Secondary | ICD-10-CM

## 2019-11-18 DIAGNOSIS — F209 Schizophrenia, unspecified: Secondary | ICD-10-CM | POA: Diagnosis present

## 2019-11-18 DIAGNOSIS — J45909 Unspecified asthma, uncomplicated: Secondary | ICD-10-CM | POA: Diagnosis present

## 2019-11-18 DIAGNOSIS — F22 Delusional disorders: Principal | ICD-10-CM | POA: Diagnosis present

## 2019-11-18 DIAGNOSIS — F29 Unspecified psychosis not due to a substance or known physiological condition: Secondary | ICD-10-CM | POA: Diagnosis not present

## 2019-11-18 MED ORDER — ALUM & MAG HYDROXIDE-SIMETH 200-200-20 MG/5ML PO SUSP: 30.0000 mL | ORAL | Status: DC | PRN

## 2019-11-18 MED ORDER — ACETAMINOPHEN 325 MG PO TABS: 650.0000 mg | ORAL_TABLET | Freq: Four times a day (QID) | ORAL | Status: DC | PRN

## 2019-11-18 MED ORDER — TRAZODONE HCL 50 MG PO TABS: 50.0000 mg | ORAL_TABLET | Freq: Every evening | ORAL | Status: DC | PRN

## 2019-11-18 MED ORDER — RISPERIDONE 1 MG PO TABS
1.0000 mg | ORAL_TABLET | Freq: Every day | ORAL | Status: DC
  Administered 2019-11-18: 1 mg via ORAL
  Filled 2019-11-18: qty 1

## 2019-11-18 MED ORDER — MAGNESIUM HYDROXIDE 400 MG/5ML PO SUSP: 30.0000 mL | Freq: Every day | ORAL | Status: DC | PRN

## 2019-11-18 NOTE — [facility] Group Notes (Signed)
[facility] Group Notes:  (Nursing/MHT/Case Management/Adjunct)  Date:  11/18/2019  Time:  9:48 PM  Type of Therapy:  Group Therapy  Participation Level:  Did Not Attend    Annitta Needs 11/18/2019, 9:48 PM

## 2019-11-18 NOTE — Tx Team (Signed)
Initial Treatment Plan 11/18/2019 7:06 PM MARDENE NEESER [id number]    PATIENT STRESSORS: Health problems Medication change or noncompliance   PATIENT STRENGTHS: Average or above average intelligence Communication skills Supportive family/friends   PATIENT IDENTIFIED PROBLEMS: Sleeplessness  Paranoia                    DISCHARGE CRITERIA:  Adequate post-discharge living arrangements Medical problems require only outpatient monitoring Verbal commitment to aftercare and medication compliance  PRELIMINARY DISCHARGE PLAN: Attend aftercare/continuing care group Return to previous living arrangement  PATIENT/FAMILY INVOLVEMENT: This treatment plan has been presented to and reviewed with the patient, [name], and/or family member,.  The patient and family have been given the opportunity to ask questions and make suggestions.  Merlene Morse, RN 11/18/2019, 7:06 PM

## 2019-11-18 NOTE — BHH Counselor (Addendum)
Patient is to be admitted to [facility] by Dr. Weber Cooks and [facility].  Attending Physician will be. Marvia Pickles, NP.   Patient has been assigned to room 302, by Fraser, RN.   Intake Paper Work has been signed and placed on patient chart.  ER staff is aware of the admission:  Dr. Harvest Dark, ER MD   Marya Amsler Patient's Nurse   [facility] Patient Access.

## 2019-11-18 NOTE — H&P (Signed)
Psychiatric Admission Assessment Adult  Patient Identification: Natalie [name] MRN:  [id number] Date of Evaluation:  11/18/2019 Chief Complaint:  Psychosis [facility]) [F29] Principal Diagnosis: Psychosis ([place]) Diagnosis:  Principal Problem:   Psychosis ([place])  History of Present Illness: Patient seen and chart reviewed.  This is a 69 year old woman whose daughter brought her into the emergency room yesterday with concerns that the patient has been increasingly paranoid for the past week or so.  Daughter reported that about a week ago the patient was awakened by loud noise at night and ever since then she appears to be paranoid and is up all night videotaping or recording people who go by the house and standing over the daughter's bed at night.  Patient had recently had her dose of Risperdal cut down to 0.25 mg at night.  On interview today the patient steadfastly denies having any symptoms or concerns at all.  She tells me she came to the emergency room simply because she needed a blood test.  She denies that she has had any more trouble sleeping than usual.  Denies paranoia denies recording videos of people.  I read her the notes in the chart and the patient's coughed at all of it.  She denies suicidal or homicidal ideation.  She says however she is happy to be compliant with the medicine and she has trusted Dr. [name] Associated Signs/Symptoms: Depression Symptoms:  None reported (Hypo) Manic Symptoms:  Patient denies any Anxiety Symptoms:  Excessive Worry, Daughter reports that the anxiety has increased although the patient denies or minimizes it Psychotic Symptoms:  Paranoia, Daughter is reporting that the patient's paranoia has been worse for the last week although the patient again denies and minimizes this. PTSD Symptoms: Negative Total Time spent with patient: 1 hour  Past Psychiatric History: Patient evidently began showing signs of paranoia and psychosis just a couple years ago around  the time she retired.  It does not appear that she has had any inpatient treatment.  No known history of suicide or violence.  She has been maintained on a small dose of evening Risperdal.  She has had similar decompensations in the past but for the most part seems to be quite stable.  As far as I can tell there was no identified mental health history before these last couple years.  Is the patient at risk to self? No.  Has the patient been a risk to self in the past 6 months? No.  Has the patient been a risk to self within the distant past? No.  Is the patient a risk to others? No.  Has the patient been a risk to others in the past 6 months? No.  Has the patient been a risk to others within the distant past? No.   Prior Inpatient Therapy:   Prior Outpatient Therapy:    Alcohol Screening: 1. How often do you have a drink containing alcohol?: Never 2. How many drinks containing alcohol do you have on a typical day when you are drinking?: 1 or 2 3. How often do you have six or more drinks on one occasion?: Never AUDIT-C Score: 0 4. How often during the last year have you found that you were not able to stop drinking once you had started?: Never 5. How often during the last year have you failed to do what was normally expected from you becasue of drinking?: Never 6. How often during the last year have you needed a first drink in the morning to  get yourself going after a heavy drinking session?: Never 7. How often during the last year have you had a feeling of guilt of remorse after drinking?: Never 8. How often during the last year have you been unable to remember what happened the night before because you had been drinking?: Never 9. Have you or someone else been injured as a result of your drinking?: No 10. Has a relative or friend or a doctor or another health worker been concerned about your drinking or suggested you cut down?: No Alcohol Use Disorder Identification Test Final Score (AUDIT):  0 Alcohol Brief Interventions/Follow-up: AUDIT Score <7 follow-up not indicated Substance Abuse History in the last 12 months:  No. Consequences of Substance Abuse: Negative Previous Psychotropic Medications: Yes  Psychological Evaluations: Yes  Past Medical History:  Past Medical History:  Diagnosis Date  . Allergy   . Arthritis   . Asthma   . Cataract   . GERD (gastroesophageal reflux disease)    pt. denied  . Heart murmur    was told years ago  . History of shingles 2006  . Hyperlipidemia     Past Surgical History:  Procedure Laterality Date  . ABDOMINAL HYSTERECTOMY    . COLONOSCOPY    . POLYPECTOMY    . VAGINAL DELIVERY     x2   Family History:  Family History  Problem Relation Age of Onset  . Arthritis Mother   . Diabetes Mother   . Hypertension Mother   . Liver cancer Mother   . Colon cancer Father 57  . Pancreatic cancer Brother   . Colon polyps Brother   . Breast cancer Daughter 32  . Alcohol abuse Other   . Heart disease Neg Hx   . Esophageal cancer Neg Hx   . Rectal cancer Neg Hx   . Stomach cancer Neg Hx    Family Psychiatric  History: None reported Tobacco Screening: Have you used any form of tobacco in the last 30 days? (Cigarettes, Smokeless Tobacco, Cigars, and/or Pipes): No Social History:  Social History   Substance and Sexual Activity  Alcohol Use No     Social History   Substance and Sexual Activity  Drug Use No    Additional Social History:                           Allergies:  No Known Allergies Lab Results:  Results for orders placed or performed during the hospital encounter of 11/17/19 (from the past 48 hour(s))  Comprehensive metabolic panel     Status: Abnormal   Collection Time: 11/17/19  3:31 PM  Result Value Ref Range   Sodium 142 135 - 145 mmol/L   Potassium 3.1 (L) 3.5 - 5.1 mmol/L   Chloride 105 98 - 111 mmol/L   CO2 28 22 - 32 mmol/L   Glucose, Bld 106 (H) 70 - 99 mg/dL    Comment: Glucose reference  range applies only to samples taken after fasting for at least 8 hours.   BUN 16 8 - 23 mg/dL   Creatinine, Ser 1.27 (H) 0.44 - 1.00 mg/dL   Calcium 9.3 8.9 - 10.3 mg/dL   Total Protein 7.7 6.5 - 8.1 g/dL   Albumin 4.2 3.5 - 5.0 g/dL   AST 28 15 - 41 U/L   ALT 15 0 - 44 U/L   Alkaline Phosphatase 61 38 - 126 U/L   Total Bilirubin 1.0 0.3 - 1.2 mg/dL  GFR calc non Af Amer 43 (L) >60 mL/min   GFR calc Af Amer 50 (L) >60 mL/min   Anion gap 9 5 - 15    Comment: Performed at [facility], [address] [place], [place] [address]  CBC     Status: None   Collection Time: 11/17/19  3:31 PM  Result Value Ref Range   WBC 7.3 4.0 - 10.5 K/uL   RBC 4.92 3.87 - 5.11 MIL/uL   Hemoglobin 15.0 12.0 - 15.0 g/dL   HCT 45.1 36.0 - 46.0 %   MCV 91.7 80.0 - 100.0 fL   MCH 30.5 26.0 - 34.0 pg   MCHC 33.3 30.0 - 36.0 g/dL   RDW 13.0 11.5 - 15.5 %   Platelets 304 150 - 400 K/uL   nRBC 0.0 0.0 - 0.2 %    Comment: Performed at [facility], [address] [place], [place] [address]  Ethanol     Status: None   Collection Time: 11/17/19  3:31 PM  Result Value Ref Range   Alcohol, Ethyl (B) <10 <10 mg/dL    Comment: (NOTE) Lowest detectable limit for serum alcohol is 10 mg/dL. For medical purposes only. Performed at [facility], [address] [place], [place] [id number]   Salicylate level     Status: Abnormal   Collection Time: 11/17/19  3:31 PM  Result Value Ref Range   Salicylate Lvl [id number] (L) 7.0 - 30.0 mg/dL    Comment: Performed at [facility], [address] [place], [place] [address]  Acetaminophen level     Status: Abnormal   Collection Time: 11/17/19  3:31 PM  Result Value Ref Range   Acetaminophen (Tylenol), Serum <10 (L) 10 - 30 ug/mL    Comment: (NOTE) Therapeutic concentrations vary significantly. A range of 10-30 ug/mL  may be an effective concentration for many patients. However, some  are best treated at concentrations outside of  this range. Acetaminophen concentrations >150 ug/mL at 4 hours after ingestion  and >50 ug/mL at 12 hours after ingestion are often associated with  toxic reactions. Performed at [facility], [address] [place], [place] [address]   Urine Drug Screen, Qualitative     Status: None   Collection Time: 11/17/19  3:31 PM  Result Value Ref Range   Tricyclic, Ur Screen NONE DETECTED NONE DETECTED   Amphetamines, Ur Screen NONE DETECTED NONE DETECTED   MDMA (Ecstasy)Ur Screen NONE DETECTED NONE DETECTED   Cocaine Metabolite,Ur [place] NONE DETECTED NONE DETECTED   Opiate, Ur Screen NONE DETECTED NONE DETECTED   Phencyclidine (PCP) Ur S NONE DETECTED NONE DETECTED   Cannabinoid 50 Ng, Ur [place] NONE DETECTED NONE DETECTED   Barbiturates, Ur Screen NONE DETECTED NONE DETECTED   Benzodiazepine, Ur Scrn NONE DETECTED NONE DETECTED   Methadone Scn, Ur NONE DETECTED NONE DETECTED    Comment: (NOTE) Tricyclics + metabolites, urine    Cutoff 1000 ng/mL Amphetamines + metabolites, urine  Cutoff 1000 ng/mL MDMA (Ecstasy), urine              Cutoff 500 ng/mL Cocaine Metabolite, urine          Cutoff 300 ng/mL Opiate + metabolites, urine        Cutoff 300 ng/mL Phencyclidine (PCP), urine         Cutoff 25 ng/mL Cannabinoid, urine                 Cutoff 50 ng/mL Barbiturates +  metabolites, urine  Cutoff 200 ng/mL Benzodiazepine, urine              Cutoff 200 ng/mL Methadone, urine                   Cutoff 300 ng/mL The urine drug screen provides only a preliminary, unconfirmed analytical test result and should not be used for non-medical purposes. Clinical consideration and professional judgment should be applied to any positive drug screen result due to possible interfering substances. A more specific alternate chemical method must be used in order to obtain a confirmed analytical result. Gas chromatography / mass spectrometry (GC/MS) is the preferred confirmat ory method. Performed at  [facility], [address] [place], [place] [address]   Respiratory Panel by RT PCR (Flu A&B, Covid) - Nasopharyngeal Swab     Status: None   Collection Time: 11/17/19  6:01 PM   Specimen: Nasopharyngeal Swab  Result Value Ref Range   SARS Coronavirus 2 by RT PCR NEGATIVE NEGATIVE    Comment: (NOTE) SARS-CoV-2 target nucleic acids are NOT DETECTED. The SARS-CoV-2 RNA is generally detectable in upper respiratoy specimens during the acute phase of infection. The lowest concentration of SARS-CoV-2 viral copies this assay can detect is 131 copies/mL. A negative result does not preclude SARS-Cov-2 infection and should not be used as the sole basis for treatment or other patient management decisions. A negative result may occur with  improper specimen collection/handling, submission of specimen other than nasopharyngeal swab, presence of viral mutation(s) within the areas targeted by this assay, and inadequate number of viral copies (<131 copies/mL). A negative result must be combined with clinical observations, patient history, and epidemiological information. The expected result is Negative. Fact Sheet for Patients:  PinkCheek.be Fact Sheet for Healthcare Providers:  GravelBags.it This test is not yet ap proved or cleared by the [place] FDA and  has been authorized for detection and/or diagnosis of SARS-CoV-2 by FDA under an Emergency Use Authorization (EUA). This EUA will remain  in effect (meaning this test can be used) for the duration of the COVID-19 declaration under Section 564(b)(1) of the Act, 21 U.S.C. section 360bbb-3(b)(1), unless the authorization is terminated or revoked sooner.    Influenza A by PCR NEGATIVE NEGATIVE   Influenza B by PCR NEGATIVE NEGATIVE    Comment: (NOTE) The Xpert Xpress SARS-CoV-2/FLU/RSV assay is intended as an aid in  the diagnosis of influenza from Nasopharyngeal swab  specimens and  should not be used as a sole basis for treatment. Nasal washings and  aspirates are unacceptable for Xpert Xpress SARS-CoV-2/FLU/RSV  testing. Fact Sheet for Patients: PinkCheek.be Fact Sheet for Healthcare Providers: GravelBags.it This test is not yet approved or cleared by the [place] FDA and  has been authorized for detection and/or diagnosis of SARS-CoV-2 by  FDA under an Emergency Use Authorization (EUA). This EUA will remain  in effect (meaning this test can be used) for the duration of the  Covid-19 declaration under Section 564(b)(1) of the Act, 21  U.S.C. section 360bbb-3(b)(1), unless the authorization is  terminated or revoked. Performed at [facility], [address] [place], [place] [address]     Blood Alcohol level:  Lab Results  Component Value Date   [facility] <10 11/17/2019   ETH <10 123XX123    Metabolic Disorder Labs:  No results found for: HGBA1C, MPG No results found for: PROLACTIN Lab Results  Component Value Date   CHOL 228 (H) 05/14/2018  TRIG 167.0 (H) 05/14/2018   HDL 48.60 05/14/2018   CHOLHDL 5 05/14/2018   VLDL 33.4 05/14/2018   LDLCALC 146 (H) 05/14/2018   LDLCALC 161 (H) 04/11/2017    Current Medications: Current Facility-Administered Medications  Medication Dose Route Frequency Provider Last Rate Last Admin  . acetaminophen (TYLENOL) tablet 650 mg  650 mg Oral Q6H PRN Alesia Morin, MD      . alum & mag hydroxide-simeth (MAALOX/MYLANTA) 200-200-20 MG/5ML suspension 30 mL  30 mL Oral Q4H PRN Alesia Morin, MD      . magnesium hydroxide (MILK OF MAGNESIA) suspension 30 mL  30 mL Oral Daily PRN Alesia Morin, MD      . risperiDONE (RISPERDAL) tablet 1 mg  1 mg Oral QHS Alesia Morin, MD      . traZODone (DESYREL) tablet 50 mg  50 mg Oral QHS PRN Alesia Morin, MD       PTA Medications: Medications Prior to Admission  Medication Sig Dispense  Refill Last Dose  . albuterol (PROAIR HFA) 108 (90 Base) MCG/ACT inhaler Inhale 2 puffs into the lungs every 6 (six) hours as needed for wheezing or shortness of breath. 6.7 g 3   . cetirizine (ZYRTEC) 10 MG tablet Take 10 mg by mouth daily.       . risperiDONE (RISPERDAL) 0.5 MG tablet Take 1 tablet (0.5 mg total) by mouth at bedtime. 30 tablet 1   . traZODone (DESYREL) 50 MG tablet Take 0.5-1 tablets (25-50 mg total) by mouth at bedtime as needed for sleep. 30 tablet 1     Musculoskeletal: Strength & Muscle Tone: within normal limits Gait & Station: normal Patient leans: N/A  Psychiatric Specialty Exam: Physical Exam  Nursing note and vitals reviewed. Constitutional: She appears well-developed and well-nourished.  HENT:  Head: Normocephalic and atraumatic.  Eyes: Pupils are equal, round, and reactive to light. Conjunctivae are normal.  Cardiovascular: Regular rhythm and normal heart sounds.  Respiratory: Effort normal. No respiratory distress.  GI: Soft.  Musculoskeletal:        General: Normal range of motion.     Cervical back: Normal range of motion.  Neurological: She is alert.  Skin: Skin is warm and dry.  Psychiatric: Judgment normal. Her affect is blunt. Her speech is delayed. She is slowed. Thought content is not paranoid. Cognition and memory are normal. She expresses no homicidal and no suicidal ideation.    Review of Systems  Constitutional: Negative.   HENT: Negative.   Eyes: Negative.   Respiratory: Negative.   Cardiovascular: Negative.   Gastrointestinal: Negative.   Musculoskeletal: Negative.   Skin: Negative.   Neurological: Negative.   Psychiatric/Behavioral: Negative for behavioral problems.    Blood pressure (!) 152/97, pulse 96, temperature 97.7 F (36.5 C), temperature source Oral, resp. rate 18, height 5\' 3"  (1.6 m), weight 83 kg, SpO2 99 %.Body mass index is 32.42 kg/m.  General Appearance: Casual  Eye Contact:  Good  Speech:  Slow  Volume:   Decreased  Mood:  Euthymic  Affect:  Constricted  Thought Process:  Coherent  Orientation:  Full (Time, Place, and Person)  Thought Content:  Logical  Suicidal Thoughts:  No  Homicidal Thoughts:  No  Memory:  Immediate;   Fair Recent;   Fair Remote;   Fair  Judgement:  Fair  Insight:  Shallow  Psychomotor Activity:  Decreased  Concentration:  Concentration: Fair  Recall:  [employer] of Knowledge:  Fair  Language:  Fair  Akathisia:  No  Handed:  Right  AIMS (if indicated):     Assets:  Desire for Improvement Housing Physical Health Resilience Social Support  ADL's:  Intact  Cognition:  WNL  Sleep:       Treatment Plan Summary: Daily contact with patient to assess and evaluate symptoms and progress in treatment, Medication management and Plan 69 year old woman.  Daughter describes the patient being more paranoid over the last week and was concerned that she wanted to stop this before it got worse.  Patient is minimizing symptoms.  She clearly feels bad about discussing this in front of anyone except her daughter but she is open to taking the medication.  There are mentions in some of the old notes about some cognitive problems but at least superficially the patient seems to be pretty intact right now.  Her short-term memory was intact to simple bedside testing and she was fully oriented.  Increase Risperdal back up to 1 mg reassess in the morning and consider discharge fairly soon.  Observation Level/Precautions:  15 minute checks  Laboratory:  Chemistry Profile  Psychotherapy:    Medications:    Consultations:    Discharge Concerns:    Estimated LOS:  Other:     Physician Treatment Plan for Primary Diagnosis: Psychosis ([place]) Long Term Goal(s): Improvement in symptoms so as ready for discharge  Short Term Goals: Ability to verbalize feelings will improve and Ability to demonstrate self-control will improve  Physician Treatment Plan for Secondary Diagnosis: Principal  Problem:   Psychosis ([place])  Long Term Goal(s): Improvement in symptoms so as ready for discharge  Short Term Goals: Ability to maintain clinical measurements within normal limits will improve and Compliance with prescribed medications will improve  I certify that inpatient services furnished can reasonably be expected to improve the patient's condition.    Alethia Berthold, MD 4/12/20215:52 PM

## 2019-11-18 NOTE — Progress Notes (Signed)
Patient pleasant and cooperative during admission assessment. Patient denies SI/HI at this time. Patient denies AVH. Patient informed of fall risk status, fall risk assessed "low" at this time. Patient oriented to unit/staff/room. Patient denies any questions/concerns at this time. Patient safe on unit with Q15 minute checks for safety. Skin assessment and body search done.No contraband found.

## 2019-11-18 NOTE — ED Notes (Addendum)
Pt discharged under IVC to BMU.  VS stable. All belongings were sent home with patient's daughter. Pt accepting of transfer.

## 2019-11-18 NOTE — ED Notes (Signed)
Pt up to the bathroom

## 2019-11-18 NOTE — [facility] Suicide Risk Assessment (Signed)
[facility] Admission Suicide Risk Assessment   Nursing information obtained from:    Demographic factors:    Current Mental Status:    Loss Factors:    Historical Factors:    Risk Reduction Factors:     Total Time spent with patient: 1 hour Principal Problem: Psychosis ([place]) Diagnosis:  Principal Problem:   Psychosis ([place])  Subjective Data: Patient seen chart reviewed.  69 year old woman who started evidencing paranoid symptoms a couple years ago.  Daughter brought her to the emergency room because she feels the patient has been more paranoid and agitated for the past week.  On interview the patient denies all symptoms and minimizes the situation.  Absolutely denies suicidal or homicidal ideation.  Patient states however she is glad to be cooperative with the medication.  No specific complaints.  Continued Clinical Symptoms:  Alcohol Use Disorder Identification Test Final Score (AUDIT): 0 The "Alcohol Use Disorders Identification Test", Guidelines for Use in Primary Care, Second Edition.  World Pharmacologist [facility]). Score between 0-7:  no or low risk or alcohol related problems. Score between 8-15:  moderate risk of alcohol related problems. Score between 16-19:  high risk of alcohol related problems. Score 20 or above:  warrants further diagnostic evaluation for alcohol dependence and treatment.   CLINICAL FACTORS:   Schizophrenia:   Paranoid or undifferentiated type   Musculoskeletal: Strength & Muscle Tone: within normal limits Gait & Station: normal Patient leans: N/A  Psychiatric Specialty Exam: Physical Exam  Nursing note and vitals reviewed. Constitutional: She appears well-developed and well-nourished.  HENT:  Head: Normocephalic and atraumatic.  Eyes: Pupils are equal, round, and reactive to light. Conjunctivae are normal.  Cardiovascular: Regular rhythm and normal heart sounds.  Respiratory: Effort normal. No respiratory distress.  GI: Soft.  Musculoskeletal:         General: Normal range of motion.     Cervical back: Normal range of motion.  Neurological: She is alert.  Skin: Skin is warm and dry.  Psychiatric: She has a normal mood and affect. Judgment normal. Her speech is delayed. She is slowed and withdrawn. Thought content is not paranoid. Cognition and memory are normal. She expresses no homicidal and no suicidal ideation.    Review of Systems  Constitutional: Negative.   HENT: Negative.   Eyes: Negative.   Respiratory: Negative.   Cardiovascular: Negative.   Gastrointestinal: Negative.   Musculoskeletal: Negative.   Skin: Negative.   Neurological: Negative.   Psychiatric/Behavioral: Negative.     Blood pressure (!) 152/97, pulse 96, temperature 97.7 F (36.5 C), temperature source Oral, resp. rate 18, height 5\' 3"  (1.6 m), weight 83 kg, SpO2 99 %.Body mass index is 32.42 kg/m.  General Appearance: Casual  Eye Contact:  Fair  Speech:  Slow  Volume:  Decreased  Mood:  Euthymic  Affect:  Constricted  Thought Process:  Coherent  Orientation:  Full (Time, Place, and Person)  Thought Content:  Logical  Suicidal Thoughts:  No  Homicidal Thoughts:  No  Memory:  Immediate;   Fair Recent;   Fair Remote;   Fair  Judgement:  Fair  Insight:  Fair  Psychomotor Activity:  Normal  Concentration:  Concentration: Fair  Recall:  [employer] of Knowledge:  Fair  Language:  Fair  Akathisia:  No  Handed:  Right  AIMS (if indicated):     Assets:  Desire for Improvement Housing Physical Health Resilience Social Support  ADL's:  Intact  Cognition:  WNL  Sleep:  COGNITIVE FEATURES THAT CONTRIBUTE TO RISK:  None    SUICIDE RISK:   Minimal: No identifiable suicidal ideation.  Patients presenting with no risk factors but with morbid ruminations; may be classified as minimal risk based on the severity of the depressive symptoms  PLAN OF CARE: Patient will remain on 15-minute checks.  She will be engaged in individual and group  assessment and therapy.  Risperdal dose has been boosted back up to 1 mg.  Reassess for what I hope can be discharge planning in the next 1 to 2 days.  I certify that inpatient services furnished can reasonably be expected to improve the patient's condition.   Alethia Berthold, MD 11/18/2019, 5:35 PM

## 2019-11-19 MED ORDER — TRAZODONE HCL 50 MG PO TABS: 50.0000 mg | ORAL_TABLET | Freq: Every evening | ORAL | 0 refills | Status: DC | PRN

## 2019-11-19 MED ORDER — RISPERIDONE 1 MG PO TABS: 1.0000 mg | ORAL_TABLET | Freq: Every day | ORAL | 0 refills | Status: DC

## 2019-11-19 NOTE — Tx Team (Addendum)
Interdisciplinary Treatment and Diagnostic Plan Update  11/19/2019 Time of Session: 9am Natalie [name] MRN: [id number]  Principal Diagnosis: Psychosis [facility])  Secondary Diagnoses: Principal Problem:   Psychosis ([place])   Current Medications:  Current Facility-Administered Medications  Medication Dose Route Frequency Provider Last Rate Last Admin  . acetaminophen (TYLENOL) tablet 650 mg  650 mg Oral Q6H PRN Alesia Morin, MD      . alum & mag hydroxide-simeth (MAALOX/MYLANTA) 200-200-20 MG/5ML suspension 30 mL  30 mL Oral Q4H PRN Alesia Morin, MD      . magnesium hydroxide (MILK OF MAGNESIA) suspension 30 mL  30 mL Oral Daily PRN Alesia Morin, MD      . risperiDONE (RISPERDAL) tablet 1 mg  1 mg Oral QHS Alesia Morin, MD   1 mg at 11/18/19 2128  . traZODone (DESYREL) tablet 50 mg  50 mg Oral QHS PRN Alesia Morin, MD       PTA Medications: Medications Prior to Admission  Medication Sig Dispense Refill Last Dose  . albuterol (PROAIR HFA) 108 (90 Base) MCG/ACT inhaler Inhale 2 puffs into the lungs every 6 (six) hours as needed for wheezing or shortness of breath. 6.7 g 3   . cetirizine (ZYRTEC) 10 MG tablet Take 10 mg by mouth daily.       . risperiDONE (RISPERDAL) 0.5 MG tablet Take 1 tablet (0.5 mg total) by mouth at bedtime. 30 tablet 1   . traZODone (DESYREL) 50 MG tablet Take 0.5-1 tablets (25-50 mg total) by mouth at bedtime as needed for sleep. 30 tablet 1     Patient Stressors: Health problems Medication change or noncompliance  Patient Strengths: Average or above average intelligence Communication skills Supportive family/friends  Treatment Modalities: Medication Management, Group therapy, Case management,  1 to 1 session with clinician, Psychoeducation, Recreational therapy.   Physician Treatment Plan for Primary Diagnosis: Psychosis ([place]) Long Term Goal(s): Improvement in symptoms so as ready for discharge Improvement in symptoms so as ready for  discharge   Short Term Goals: Ability to verbalize feelings will improve Ability to demonstrate self-control will improve Ability to maintain clinical measurements within normal limits will improve Compliance with prescribed medications will improve  Medication Management: Evaluate patient's response, side effects, and tolerance of medication regimen.  Therapeutic Interventions: 1 to 1 sessions, Unit Group sessions and Medication administration.  Evaluation of Outcomes: Adequate for Discharge  Physician Treatment Plan for Secondary Diagnosis: Principal Problem:   Psychosis ([place])  Long Term Goal(s): Improvement in symptoms so as ready for discharge Improvement in symptoms so as ready for discharge   Short Term Goals: Ability to verbalize feelings will improve Ability to demonstrate self-control will improve Ability to maintain clinical measurements within normal limits will improve Compliance with prescribed medications will improve     Medication Management: Evaluate patient's response, side effects, and tolerance of medication regimen.  Therapeutic Interventions: 1 to 1 sessions, Unit Group sessions and Medication administration.  Evaluation of Outcomes: Adequate for Discharge   RN Treatment Plan for Primary Diagnosis: Psychosis ([place]) Long Term Goal(s): Knowledge of disease and therapeutic regimen to maintain health will improve  Short Term Goals: Ability to identify and develop effective coping behaviors will improve  Medication Management: RN will administer medications as ordered by provider, will assess and evaluate patient's response and provide education to patient for prescribed medication. RN will report any adverse and/or side effects to prescribing provider.  Therapeutic Interventions: 1 on 1 counseling sessions, Psychoeducation, Medication administration, Evaluate responses to treatment, Monitor  vital signs and CBGs as ordered, Perform/monitor CIWA, COWS, AIMS and  Fall Risk screenings as ordered, Perform wound care treatments as ordered.  Evaluation of Outcomes: Adequate for Discharge   LCSW Treatment Plan for Primary Diagnosis: Psychosis [facility]) Long Term Goal(s): Safe transition to appropriate next level of care at discharge, Engage patient in therapeutic group addressing interpersonal concerns.  Short Term Goals: Engage patient in aftercare planning with referrals and resources  Therapeutic Interventions: Assess for all discharge needs, 1 to 1 time with Social worker, Explore available resources and support systems, Assess for adequacy in community support network, Educate family and significant other(s) on suicide prevention, Complete Psychosocial Assessment, Interpersonal group therapy.  Evaluation of Outcomes: Adequate for Discharge   Progress in Treatment: Attending groups: No. Participating in groups: No. Taking medication as prescribed: Yes. Toleration medication: Yes. Family/Significant other contact made: Yes, individual(s) contacted:  with pt; declined family contact Patient understands diagnosis: Yes. Discussing patient identified problems/goals with staff: Yes. Medical problems stabilized or resolved: Yes. Denies suicidal/homicidal ideation: Yes. Issues/concerns per patient self-inventory: No. Other: NA  New problem(s) identified: No, Describe:  None reported  New Short Term/Long Term Goal(s):Attend outpatient treatment, develop and implement healthy coping skills, take medication as prescribed  Patient Goals:  "Get back home to my grandbaby"  Discharge Plan or Barriers: Pt plans to go to her daughters home and follow up with Dr. [name], where she is an existing patient   Reason for Continuation of Hospitalization: Pt scheduled for discharged today 11/19/19  Estimated Length of Stay: N/A  Recreational Therapy: Patient Stressors: N/A Patient Goal: Patient will engage in groups without prompting or encouragement from LRT x3  group sessions within 5 recreation therapy group sessions  Attendees: Patient:Natalie [name] 11/19/2019 3:05 PM  Physician: Alethia Berthold 11/19/2019 3:05 PM  Nursing: Nat Math Ravenell 11/19/2019 3:05 PM  RN Care Manager: 11/19/2019 3:05 PM  Social Worker: Anise Salvo 11/19/2019 3:05 PM  Recreational Therapist: Isaias Sakai Kimmora Risenhoover 11/19/2019 3:05 PM  Other:  11/19/2019 3:05 PM  Other:  11/19/2019 3:05 PM  Other: 11/19/2019 3:05 PM    Scribe for Treatment Team: Yvette Rack, LCSW 11/19/2019 3:05 PM

## 2019-11-19 NOTE — Plan of Care (Signed)
  Problem: Education: Goal: Knowledge of [facility] Education information/materials will improve Outcome: Progressing Goal: Emotional status will improve Outcome: Progressing  Patient has remained isolative to room. Denies SI, HI and AVH. Did not come out for group. Medication compliant. Pleasant and cooperative.

## 2019-11-19 NOTE — BHH Counselor (Signed)
Adult Comprehensive Assessment  Patient ID: Natalie [name], female   DOB: 06-13-1951, 69 y.o.   MRN: [id number]  Information Source:    Current Stressors:     Living/Environment/Situation:  Living Arrangements: Children  Family History:     Childhood History:     Education:     Employment/Work Situation:      Museum/gallery curator Resources:      Alcohol/Substance Abuse:      Social Support System:      Leisure/Recreation:      Strengths/Needs:      Discharge Plan:      Summary/Recommendations:   Summary and Recommendations (to be completed by the evaluator): Pt is scheduled for discharge today. Pt is a 69 yr old female brought to the ED accompanied by her daughter due to changes in her behavior. Chart review, states pt has been experiencing paranoia and poor sleep pattern. Pt states being followed by Natalie [name] at [facility] and would like to resume treatment with provider. Pt denies SI/HI. Pt denies [place]. While here, patient will benefit from crisis stabilization, medication evaluation, group therapy and psychoeducation. In addition, it is recommended that patient remain compliant with the established discharge plan and continue treatment.  Natalie [name] Natalie [name] Natalie [name]. 11/19/2019

## 2019-11-19 NOTE — Progress Notes (Signed)
Patient ID: Natalie [name], female   DOB: 16-Nov-1950, 69 y.o.   MRN: [id number]  Discharge Note:  Patient denies SI/HI/AVH at this time. Discharge instructions, AVS, and transition record gone over with patient. Patient agrees to comply with medication management, follow-up visit, and outpatient therapy. Patient had no belongings to return. Patient questions and concerns addressed and answered. Patient ambulatory off unit. Patient discharged to home with Daughter.

## 2019-11-19 NOTE — Progress Notes (Signed)
  [facility] Adult Case Management Discharge Plan :  Will you be returning to the same living situation after discharge:  Yes,  pt will go to daughter's home At discharge, do you have transportation home?: Yes,  pt daughter will pick up Do you have the ability to pay for your medications: Yes,  Medicare  Release of information consent forms completed and in the chart;  Patient's signature needed at discharge.  Patient to Follow up at: Follow-up Information    Ursula Alert, MD Follow up on 11/21/2019.   Specialty: Psychiatry Why: You have a virtual appointment scheduled with Dr. [name] on Thursday, April 15th at 11:20am. Thank you. Contact information: [place] [address] [phone]           Next level of care provider has access to [place] and Suicide Prevention discussed: Yes,  with pt; declined family contact  Have you used any form of tobacco in the last 30 days? (Cigarettes, Smokeless Tobacco, Cigars, and/or Pipes): No  Has patient been referred to the Quitline?: N/A patient is not a smoker  Patient has been referred for addiction treatment: N/A  Yvette Rack, LCSW 11/19/2019, 2:00 PM

## 2019-11-19 NOTE — BHH Suicide Risk Assessment (Signed)
[facility] Discharge Suicide Risk Assessment   Principal Problem: Psychosis [facility]) Discharge Diagnoses: Principal Problem:   Psychosis ([place])   Total Time spent with patient: 30 minutes  Musculoskeletal: Strength & Muscle Tone: within normal limits Gait & Station: normal Patient leans: N/A  Psychiatric Specialty Exam: Review of Systems  Constitutional: Negative.   HENT: Negative.   Eyes: Negative.   Respiratory: Negative.   Cardiovascular: Negative.   Gastrointestinal: Negative.   Musculoskeletal: Negative.   Skin: Negative.   Neurological: Negative.   Psychiatric/Behavioral: Negative.     Blood pressure 139/73, pulse (!) 132, temperature 98.4 F (36.9 C), temperature source Oral, resp. rate 17, height 5\' 3"  (1.6 m), weight 83 kg, SpO2 100 %.Body mass index is 32.42 kg/m.  General Appearance: Casual  Eye Contact::  Good  Speech:  Slow409  Volume:  Decreased  Mood:  Euthymic  Affect:  Congruent  Thought Process:  Coherent  Orientation:  Full (Time, Place, and Person)  Thought Content:  Logical  Suicidal Thoughts:  No  Homicidal Thoughts:  No  Memory:  Immediate;   Fair Recent;   Fair Remote;   Fair  Judgement:  Fair  Insight:  Fair  Psychomotor Activity:  Normal  Concentration:  Fair  Recall:  [employer] [place]  Language: Fair  Akathisia:  No  Handed:  Right  AIMS (if indicated):     Assets:  Desire for Improvement Housing Physical Health Resilience Social Support  Sleep:  Number of Hours: 8.25  Cognition: WNL  ADL's:  Intact   Mental Status Per Nursing Assessment::   On Admission:  NA  Demographic Factors:  Age 37 or older  Loss Factors: NA  Historical Factors: NA  Risk Reduction Factors:   Sense of responsibility to family, Living with another person, especially a relative, Positive social support and Positive therapeutic relationship  Continued Clinical Symptoms:  Schizophrenia:   Paranoid or undifferentiated type  Cognitive  Features That Contribute To Risk:  None    Suicide Risk:  Minimal: No identifiable suicidal ideation.  Patients presenting with no risk factors but with morbid ruminations; may be classified as minimal risk based on the severity of the depressive symptoms    Plan Of Care/Follow-up recommendations:  Activity:  Activity as tolerated Diet:  Regular diet Other:  Patient will follow-up with her outpatient provider as usual.  Alethia Berthold, MD 11/19/2019, 10:55 AM

## 2019-11-19 NOTE — Progress Notes (Signed)
Patient has remained isolative to room. Denies SI, HI and AVH. Did not come out for group. Medication compliant. Pleasant and cooperative.

## 2019-11-19 NOTE — Progress Notes (Signed)
Recreation Therapy Notes  Date: 11/19/2019  Time: 9:30 am   Location: Craft room   Behavioral response: N/A   Intervention Topic: Strengths    Discussion/Intervention: Patient did not attend group.   Clinical Observations/Feedback:  Patient did not attend group.   Elgar Scoggins LRT/CTRS        Grisell Bissette 11/19/2019 10:34 AM

## 2019-11-19 NOTE — Progress Notes (Signed)
D- Patient alert and oriented. Patient presents in a paranoid, but pleasant mood on assessment stating that she slept "pretty good" last night, and had no complaints to voice to this Probation officer. Patient denies any signs/symptoms of depression/anxiety. Patient also denies SI, HI, AVH, and pain at this time. Patient had no stated goals for today.  A- Support and encouragement provided.  Routine safety checks conducted every 15 minutes. Patient informed to notify staff with problems or concerns.  R- Patient contracts for safety at this time. Patient compliant with medications and treatment plan. Patient receptive, calm, and cooperative. Patient interacts well with others on the unit. Patient remains safe at this time.

## 2019-11-19 NOTE — BHH Group Notes (Signed)

## 2019-11-19 NOTE — Discharge Summary (Signed)
Physician Discharge Summary Note  Patient:  Natalie [name] is an 69 y.o., female MRN:  [id number] DOB:  08-20-1950 Patient phone:  [phone] (home)  Patient address:   [address] [place] [address],  Total Time spent with patient: 30 minutes  Date of Admission:  11/18/2019 Date of Discharge: 11/19/2019  Reason for Admission:  Patient seen and chart reviewed.  This is a 69 year old woman whose daughter brought her into the emergency room yesterday with concerns that the patient has been increasingly paranoid for the past week or so.  Daughter reported that about a week ago the patient was awakened by loud noise at night and ever since then she appears to be paranoid and is up all night videotaping or recording people who go by the house and standing over the daughter's bed at night.  Patient had recently had her dose of Risperdal cut down to 0.25 mg at night.  On interview today the patient steadfastly denies having any symptoms or concerns at all.  She tells me she came to the emergency room simply because she needed a blood test.  She denies that she has had any more trouble sleeping than usual.  Denies paranoia denies recording videos of people.  I read her the notes in the chart and the patient's coughed at all of it.  She denies suicidal or homicidal ideation.  She says however she is happy to be compliant with the medicine and she has trusted Dr. [name].  Associated Signs/Symptoms: Depression Symptoms:  None reported (Hypo) Manic Symptoms:  Patient denies any Anxiety Symptoms:  Excessive Worry, Daughter reports that the anxiety has increased although the patient denies or minimizes it Psychotic Symptoms:  Paranoia, Daughter is reporting that the patient's paranoia has been worse for the last week although the patient again denies and minimizes this. PTSD Symptoms: Negative   Past Psychiatric History: Patient evidently began showing signs of paranoia and psychosis just a  couple years ago around the time she retired.  It does not appear that she has had any inpatient treatment.  No known history of suicide or violence.  She has been maintained on a small dose of evening Risperdal.  She has had similar decompensations in the past but for the most part seems to be quite stable.  As far as I can tell there was no identified mental health history before these last couple years.   Principal Problem: Psychosis [facility]) Discharge Diagnoses: Principal Problem:   Psychosis [facility])  Past Medical History:  Past Medical History:  Diagnosis Date  . Allergy   . Arthritis   . Asthma   . Cataract   . GERD (gastroesophageal reflux disease)    pt. denied  . Heart murmur    was told years ago  . History of shingles 2006  . Hyperlipidemia     Past Surgical History:  Procedure Laterality Date  . ABDOMINAL HYSTERECTOMY    . COLONOSCOPY    . POLYPECTOMY    . VAGINAL DELIVERY     x2   Family History:  Family History  Problem Relation Age of Onset  . Arthritis Mother   . Diabetes Mother   . Hypertension Mother   . Liver cancer Mother   . Colon cancer Father 26  . Pancreatic cancer Brother   . Colon polyps Brother   . Breast cancer Daughter 12  . Alcohol abuse Other   . Heart disease Neg Hx   . Esophageal cancer Neg Hx   .  Rectal cancer Neg Hx   . Stomach cancer Neg Hx    Family Psychiatric  History: None reported. Social History:  Social History   Substance and Sexual Activity  Alcohol Use No     Social History   Substance and Sexual Activity  Drug Use No    Social History   Socioeconomic History  . Marital status: Single    Spouse name: Not on file  . Number of children: 2  . Years of education: Not on file  . Highest education level: Not on file  Occupational History  . 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 Smoking status: Never Smoker  . Smokeless tobacco: Never Used  Substance and  Sexual Activity  . Alcohol use: No  . Drug use: No  . Sexual activity: Not Currently  Other Topics Concern  . Not on file  Social History Narrative   No living will   Requests daughter [name] as health care POA   Would accept resuscitation but no prolonged machines   Not sure about tube feeds      Right handed      Currently lives with daughter      Completed HS   Social Determinants of Health   Financial Resource Strain: Low Risk   . Difficulty of Paying Living Expenses: Not very hard  Food Insecurity: Food Insecurity Present  . Worried About Charity fundraiser in the Last Year: Sometimes true  . Ran Out of Food in the Last Year: Sometimes true  Transportation Needs: Unmet Transportation Needs  . Lack of Transportation (Medical): Yes  . Lack of Transportation (Non-Medical): Yes  Physical Activity: Inactive  . Days of Exercise per Week: 0 days  . Minutes of Exercise per Session: 0 min  Stress:   . Feeling of Stress :   Social Connections: Unknown  . Frequency of Communication with Friends and Family: Not on file  . Frequency of Social Gatherings with Friends and Family: Not on file  . Attends Religious Services: Never  . Active Member of Clubs or Organizations: No  . Attends Archivist Meetings: Never  . Marital Status: Never married    Hospital Course: Natalie [name] was admitted for Psychosis [facility]) and crisis management.  She was treated with the following medications Risperdal 1mg  po whs for psychosis.  Natalie [name] was discharged with current medication and was instructed on how to take medications as prescribed; (details listed below under Medication List).  Medical problems were identified and treated as needed.  Home medications were restarted as appropriate. Labs obtained were determined to be normal with the exception of the creatinine 1.27 and potassium 3.1 (potassium was replete through oral supplementation).   Improvement was monitored by  observation and Natalie [name] daily report of symptom reduction.  Emotional and mental status was monitored by daily self-inventory reports completed by Natalie [name] and clinical staff.         Natalie [name] was evaluated by the treatment team for stability and plans for continued recovery upon discharge.  Natalie [name] motivation was an integral factor for scheduling further treatment.  Employment, transportation, bed availability, health status, family support, and any pending legal issues were also considered during her hospital stay.  She was offered further treatment options upon discharge including but not limited to Residential, Intensive Outpatient, and Outpatient treatment.  Natalie [name] will follow up with the  services as listed below under Follow Up Information.     Upon completion of this admission the Natalie [name] was both mentally and medically stable for discharge denying suicidal/homicidal ideation, auditory/visual/tactile hallucinations, delusional thoughts and paranoia.      Physical Findings: AIMS:  , ,  ,  ,    CIWA:    COWS:     Musculoskeletal: Strength & Muscle Tone: within normal limits Gait & Station: normal Patient leans: N/A  Psychiatric Specialty Exam: See MD SRA Physical Exam  Nursing note and vitals reviewed.   Review of Systems  Blood pressure 139/73, pulse (!) 132, temperature 98.4 F (36.9 C), temperature source Oral, resp. rate 17, height 5\' 3"  (1.6 m), weight 83 kg, SpO2 100 %.Body mass index is 32.42 kg/m.  Sleep:  Number of Hours: 8.25     Have you used any form of tobacco in the last 30 days? (Cigarettes, Smokeless Tobacco, Cigars, and/or Pipes): No  Has this patient used any form of tobacco in the last 30 days? (Cigarettes, Smokeless Tobacco, Cigars, and/or Pipes)  No  Blood Alcohol level:  Lab Results  Component Value Date   ETH <10 11/17/2019   ETH <10 123XX123    Metabolic Disorder Labs:  No  results found for: HGBA1C, MPG No results found for: PROLACTIN Lab Results  Component Value Date   CHOL 228 (H) 05/14/2018   TRIG 167.0 (H) 05/14/2018   HDL 48.60 05/14/2018   CHOLHDL 5 05/14/2018   VLDL 33.4 05/14/2018   LDLCALC 146 (H) 05/14/2018   LDLCALC 161 (H) 04/11/2017    See Psychiatric Specialty Exam and Suicide Risk Assessment completed by Attending Physician prior to discharge.  Discharge destination:  Home  Is patient on multiple antipsychotic therapies at discharge:  No   Has Patient had three or more failed trials of antipsychotic monotherapy by history:  No  Recommended Plan for Multiple Antipsychotic Therapies: NA  Discharge Instructions    Discharge instructions   Complete by: As directed    Please continue to take medications as directed. If your symptoms return, worsen, or persist please call your 911, report to local ER, or contact crisis hotline. Please do not drink alcohol or use any illegal substances while taking prescription medications.     Allergies as of 11/19/2019   No Known Allergies     Medication List    TAKE these medications     Indication  albuterol 108 (90 Base) MCG/ACT inhaler Commonly known as: ProAir HFA Inhale 2 puffs into the lungs every 6 (six) hours as needed for wheezing or shortness of breath.  Indication: Asthma   cetirizine 10 MG tablet Commonly known as: ZYRTEC Take 10 mg by mouth daily.  Indication: Upper Respiratory Tract Allergy, Atopic Dermatitis   risperiDONE 1 MG tablet Commonly known as: RISPERDAL Take 1 tablet (1 mg total) by mouth at bedtime. What changed:   medication strength  how much to take  Indication: psychosis   traZODone 50 MG tablet Commonly known as: DESYREL Take 1 tablet (50 mg total) by mouth at bedtime as needed for sleep. What changed: how much to take  Indication: Trouble Sleeping        Follow-up recommendations:  Activity:  Increase activity as tolerated Diet:  Routine diet  as directed Tests:  Routine testing as directed. All labs were determined to be normal except your potassium(3.1)  and creatnine(1.27). Please follow up with your PCP regarding these lab values.  Other:  Continue your medications  even if you begin to feel better. FOllow up with your psychiatrist after discharge.   Comments:   Signed: Suella Broad, FNP 11/19/2019, 9:36 AM

## 2019-11-20 ENCOUNTER — Telehealth: Payer: Self-pay

## 2019-11-20 NOTE — Telephone Encounter (Signed)
2nd/final attempt- Left HIPPA complaint message to return my call stating the need to complete TCM and schedule hospital follow up visit.

## 2019-11-20 NOTE — Telephone Encounter (Signed)
1st attempt- Left HIPPA complaint message to return my call stating the need to complete TCM and schedule hospital follow up visit.

## 2019-11-20 NOTE — Telephone Encounter (Signed)
Not surprising Hopefully she will at least follow up with psychiatry

## 2019-11-21 ENCOUNTER — Encounter: Payer: Self-pay | Admitting: Psychiatry

## 2019-11-21 ENCOUNTER — Ambulatory Visit (INDEPENDENT_AMBULATORY_CARE_PROVIDER_SITE_OTHER): Payer: Medicare Other | Admitting: Psychiatry

## 2019-11-21 ENCOUNTER — Other Ambulatory Visit: Payer: Self-pay

## 2019-11-21 DIAGNOSIS — F29 Unspecified psychosis not due to a substance or known physiological condition: Secondary | ICD-10-CM | POA: Diagnosis not present

## 2019-11-21 DIAGNOSIS — G4701 Insomnia due to medical condition: Secondary | ICD-10-CM

## 2019-11-21 DIAGNOSIS — G3184 Mild cognitive impairment, so stated: Secondary | ICD-10-CM | POA: Diagnosis not present

## 2019-11-21 DIAGNOSIS — F0670 Mild neurocognitive disorder due to known physiological condition without behavioral disturbance: Secondary | ICD-10-CM

## 2019-11-21 NOTE — Progress Notes (Signed)
Provider Location : [facility] Patient Location : Home  Virtual Visit via Telephone Note  I connected with Natalie [name] on 11/21/19 at 11:20 AM EDT by telephone and verified that I am speaking with the correct person using two identifiers.   I discussed the limitations, risks, security and privacy concerns of performing an evaluation and management service by telephone and the availability of in person appointments. I also discussed with the patient that there may be a patient responsible charge related to this service. The patient expressed understanding and agreed to proceed.     I discussed the assessment and treatment plan with the patient. The patient was provided an opportunity to ask questions and all were answered. The patient agreed with the plan and demonstrated an understanding of the instructions.   The patient was advised to call back or seek an in-person evaluation if the symptoms worsen or if the condition fails to improve as anticipated. [place] MD OP Progress Note  11/21/2019 5:53 PM Natalie [name]  MRN:  [id number]  Chief Complaint:  Chief Complaint    Follow-up     HPI: Natalie [name] is a 69 year old African-American female, retired, currently lives with her daughter, has a history of psychosis, mild neurocognitive disorder, asthma, hypokalemia was evaluated by phone today.  Patient preferred to do a phone call.  Patient was recently admitted and discharged from [facility] behavioral health unit.  I have reviewed medical records per [facility] [place],as well as Dr. Weber Cooks dated 11/18/2019-patient with psychosis discharged on risperidone 1 mg and trazodone 50 mg.'  Patient today appeared to be guarded in session.  Patient reported that she continues to not sleep well at night.  She reports her sleep problems are more so because of the noises around her house.  Unknown if these are auditory hallucinations.  Patient however denies AH or VH.  She does appear to be paranoid however did not  seem to be preoccupied with any of her paranoid delusions.  Patient does report possibly not being compliant on medications as prescribed.  She reports she did not take one of her medications last night since she has a bad taste in her mouth.  Patient denies any suicidality or homicidality.  Patient appeared to be alert, oriented to person place time and situation.  Attempted to obtain collateral information from daughter-[name], left voicemail at [phone].   Visit Diagnosis:    ICD-10-CM   1. Mild neurocognitive disorder due to multiple etiologies  G31.84   2. Psychosis, unspecified psychosis type ([place])  F29   3. Insomnia due to medical condition  G47.01     Past Psychiatric History: I have reviewed past psychiatric history from my progress note on 03/25/2019.  Past trials of Invega Sustenna risperidone  Past Medical History:  Past Medical History:  Diagnosis Date  . Allergy   . Arthritis   . Asthma   . Cataract   . GERD (gastroesophageal reflux disease)    pt. denied  . Heart murmur    was told years ago  . History of shingles 2006  . Hyperlipidemia     Past Surgical History:  Procedure Laterality Date  . ABDOMINAL HYSTERECTOMY    . COLONOSCOPY    . POLYPECTOMY    . VAGINAL DELIVERY     x2    Family Psychiatric History: I have reviewed family psychiatric history from my progress note on 03/25/2019  Family History:  Family History  Problem Relation Age of Onset  . Arthritis Mother   .  Diabetes Mother   . Hypertension Mother   . Liver cancer Mother   . Colon cancer Father 57  . Pancreatic cancer Brother   . Colon polyps Brother   . Breast cancer Daughter 76  . Alcohol abuse Other   . Heart disease Neg Hx   . Esophageal cancer Neg Hx   . Rectal cancer Neg Hx   . Stomach cancer Neg Hx     Social History: Reviewed social history from my progress note on 03/25/2019 Social History   Socioeconomic History  . Marital status: Single    Spouse name: Not on file   . Number of children: 2  . Years of education: Not on file  . Highest education level: Not on file  Occupational History  . 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 Smoking status: Never Smoker  . Smokeless tobacco: Never Used  Substance and Sexual Activity  . Alcohol use: No  . Drug use: No  . Sexual activity: Not Currently  Other Topics Concern  . Not on file  Social History Narrative   No living will   Requests daughter Natalie [name] as health care POA   Would accept resuscitation but no prolonged machines   Not sure about tube feeds      Right handed      Currently lives with daughter      Completed HS   Social Determinants of Health   Financial Resource Strain: Low Risk   . Difficulty of Paying Living Expenses: Not very hard  Food Insecurity: Food Insecurity Present  . Worried About Charity fundraiser in the Last Year: Sometimes true  . Ran Out of Food in the Last Year: Sometimes true  Transportation Needs: Unmet Transportation Needs  . Lack of Transportation (Medical): Yes  . Lack of Transportation (Non-Medical): Yes  Physical Activity: Inactive  . Days of Exercise per Week: 0 days  . Minutes of Exercise per Session: 0 min  Stress:   . Feeling of Stress :   Social Connections: Unknown  . Frequency of Communication with Friends and Family: Not on file  . Frequency of Social Gatherings with Friends and Family: Not on file  . Attends Religious Services: Never  . Active Member of Clubs or Organizations: No  . Attends Archivist Meetings: Never  . Marital Status: Never married    Allergies: No Known Allergies  Metabolic Disorder Labs: No results found for: HGBA1C, MPG No results found for: PROLACTIN Lab Results  Component Value Date   CHOL 228 (H) 05/14/2018   TRIG 167.0 (H) 05/14/2018   HDL 48.60 05/14/2018   CHOLHDL 5 05/14/2018   VLDL 33.4 05/14/2018   LDLCALC 146 (H) 05/14/2018   LDLCALC  161 (H) 04/11/2017   Lab Results  Component Value Date   TSH 1.448 03/04/2019   TSH 0.952 04/04/2012    Therapeutic Level Labs: No results found for: LITHIUM No results found for: VALPROATE No components found for:  CBMZ  Current Medications: Current Outpatient Medications  Medication Sig Dispense Refill  . albuterol (PROAIR HFA) 108 (90 Base) MCG/ACT inhaler Inhale 2 puffs into the lungs every 6 (six) hours as needed for wheezing or shortness of breath. 6.7 g 3  . cetirizine (ZYRTEC) 10 MG tablet Take 10 mg by mouth daily.      . risperiDONE (RISPERDAL) 1 MG tablet Take 1 tablet (1 mg total) by mouth at bedtime. 30 tablet 0  .  traZODone (DESYREL) 50 MG tablet Take 1 tablet (50 mg total) by mouth at bedtime as needed for sleep. 30 tablet 0   No current facility-administered medications for this visit.     Musculoskeletal: Strength & Muscle Tone: UTA Gait & Station: UTA Patient leans: N/A  Psychiatric Specialty Exam: Review of Systems  Psychiatric/Behavioral: Positive for sleep disturbance.  All other systems reviewed and are negative.   There were no vitals taken for this visit.There is no height or weight on file to calculate BMI.  General Appearance: UTA  Eye Contact:  UTA  Speech:  Clear and Coherent  Volume:  Normal  Mood:  Reports she is OK  Affect:  UTA  Thought Process:  Goal Directed and Descriptions of Associations: Intact  Orientation:  Full (Time, Place, and Person)  Thought Content: Paranoid Ideation - appears to be guarded - but not too preoccupied with any delusional thoughts   Suicidal Thoughts:  No  Homicidal Thoughts:  No  Memory:  Immediate;   Fair Recent;   Fair Remote;   Fair  Judgement:  Fair  Insight:  Fair  Psychomotor Activity:  UTA  Concentration:  Concentration: Fair and Attention Span: Fair  Recall:  [employer] of Knowledge: Fair  Language: Fair  Akathisia:  No  Handed:  Right  AIMS (if indicated): UTA  Assets:  Communication  Skills Desire for Improvement Housing Social Support  ADL's:  Intact  Cognition: WNL  Sleep:  Poor   Screenings: AUDIT     Admission (Discharged) from 11/18/2019 in [place]  Alcohol Use Disorder Identification Test Final Score (AUDIT)  0    PHQ2-9     Office Visit from 05/17/2019 in [place] at [facility] Visit from 04/11/2017 in [place] at [facility] Visit from 04/06/2016 in [place] at [facility] Visit from 04/06/2015 in [place] at [facility] Visit from 04/04/2014 in [place] at [facility]  PHQ-2 Total Score  0  0  0  0  0       Assessment and Plan: Takaya is a 69 year old African-American female, single currently lives with her daughter Natalie [name], has a history of cognitive disorder, psychosis likely secondary to cognitive issues, asthma, hypokalemia was evaluated by phone today.  Patient was recently discharged from inpatient mental health facility at [facility] after she had an episode of psychosis.  Patient today likely noncompliant with medications and also continues to have sleep issues.  Plan as noted below.  Plan Mild neurocognitive disorder-she will continue to follow-up with neurology.  Psychosis likely secondary to cognitive problems-unstable Risperidone 1 mg p.o. nightly-encouraged compliance   Insomnia due to medical problems-unstable Encouraged to take trazodone 50 mg p.o. nightly.  Attempted to obtain collateral information from daughter [place]  I have reviewed medical records in [facility] from her most recent admission-dated 11/18/2019-11/19/2019- Ms.Hattie Perch NP as well as Dr.Clapacs as summarized above.  Follow-up in clinic in 3 weeks or sooner if needed.  I have spent atleast 20 minutes non face to face with patient today. More than 50 % of the time was spent for preparing to see the patient ( e.g., review of test, records ), ordering medications  and test ,psychoeducation and supportive psychotherapy and care coordination,as well as documenting clinical information in electronic health record. This note was generated in part or whole with voice recognition software. Voice recognition is usually quite accurate but there are transcription errors that can and  very often do occur. I apologize for any typographical errors that were not detected and corrected.       Ursula Alert, MD 11/21/2019, 5:53 PM

## 2019-11-25 ENCOUNTER — Telehealth: Payer: Self-pay

## 2019-11-25 NOTE — Telephone Encounter (Signed)
She states she returning your call from last week. pt daughter called states that her mom is not right. she states that she takes her car keys and will not tell her where she put them. she locks her out of the house. she states she not acting right.

## 2019-11-25 NOTE — Telephone Encounter (Signed)
Jess if she is not right , please ask her to take her back to the hospital. She was recently discharged after a day of hospital stay. She may need it.

## 2019-11-25 NOTE — Telephone Encounter (Signed)
Left message to take pt back to hospital if she is not doing any better. Call if there is any questions.

## 2019-11-27 ENCOUNTER — Telehealth: Payer: Self-pay

## 2019-11-27 NOTE — Telephone Encounter (Signed)
Patient's daughter called and stated that patient is still paranoid. She stated that patient locks her out of the house when she's bringing in groceries and daughter is "on her last nerve." Daughter stated that something is not right with her mother and that she acts "manic" and "schizophrenic." She also stated that she stands around her house with a mask and shoes on holding her purse like she's going somewhere but she's not going anywhere. She stated that she would like some recommendations or to try to get an earlier appointment (followup scheduled for 12/18/19). Please review and advise. Thank you.

## 2019-11-28 NOTE — Telephone Encounter (Signed)
Writer reached out to patient was able to answer questions appropriately and denied any perceptual disturbances.  She also reported sleep as good.  She reported she was compliant on medications.  Writer attempted to reach daughter-[name] however it went into voicemail.  Left a message that we could increase her risperidone to a higher dosage if needed however if she is having psychosis or if she appears to be confused recommended to take her back to the nearest emergency department.

## 2019-12-03 ENCOUNTER — Telehealth: Payer: Self-pay

## 2019-12-03 DIAGNOSIS — E876 Hypokalemia: Secondary | ICD-10-CM

## 2019-12-03 NOTE — Telephone Encounter (Signed)
Natalie [name] said pt recently admitted to hospital with paranoia and while in [place] noticed pts K was low at 3.1. Natalie [name] wants pt to have appt to have K and kidney function evaluted. I spoke with Natalie [name] and she was d/c from  hospital on 11/19/19. Natalie [name] is also concerned about kidney function.Pt has no covid symptoms, no travel and no known exposure to + covid. [facility] request that I call and give pt appt info; I spoke with pt and appt info given and pt voiced understanding. 30" appt on 12/04/19 at 8 AM with Dr Silvio Pate. Natalie [name] will not be able to come with pt for appt and request cb after pt is seen. Natalie [name] first said pt cannot drive but Natalie [name] then said pt only short distance to [place] will let pt drive to [facility].FYI to Dr Silvio Pate and Larene Beach CMA.

## 2019-12-03 NOTE — Telephone Encounter (Signed)
Pt called back and said appt scheduled on 12/04/19 at 8 AM was too early for pt; pt said she needs later appt; Larene Beach CMA approved changing appt to 12/04/19 at 11 AM. Pt voiced  Understanding. I called and spoke with [name] and advised her of pts changing appt time from 12/04/2019 8 AM until 12/04/19 at [place] voiced understanding.

## 2019-12-03 NOTE — Telephone Encounter (Signed)
Yes---11 Am is fine

## 2019-12-04 ENCOUNTER — Other Ambulatory Visit: Payer: Medicare Other

## 2019-12-04 ENCOUNTER — Ambulatory Visit: Payer: Medicare Other | Admitting: Internal Medicine

## 2019-12-04 ENCOUNTER — Ambulatory Visit (INDEPENDENT_AMBULATORY_CARE_PROVIDER_SITE_OTHER): Payer: Medicare Other | Admitting: Internal Medicine

## 2019-12-04 ENCOUNTER — Encounter: Payer: Self-pay | Admitting: Internal Medicine

## 2019-12-04 ENCOUNTER — Other Ambulatory Visit: Payer: Self-pay

## 2019-12-04 DIAGNOSIS — F29 Unspecified psychosis not due to a substance or known physiological condition: Secondary | ICD-10-CM

## 2019-12-04 DIAGNOSIS — F0670 Mild neurocognitive disorder due to known physiological condition without behavioral disturbance: Secondary | ICD-10-CM

## 2019-12-04 DIAGNOSIS — G3184 Mild cognitive impairment, so stated: Secondary | ICD-10-CM

## 2019-12-04 DIAGNOSIS — J4520 Mild intermittent asthma, uncomplicated: Secondary | ICD-10-CM | POA: Diagnosis not present

## 2019-12-04 DIAGNOSIS — E876 Hypokalemia: Secondary | ICD-10-CM | POA: Diagnosis not present

## 2019-12-04 NOTE — Telephone Encounter (Signed)
Pt came in for labs just now.

## 2019-12-04 NOTE — Assessment & Plan Note (Signed)
Had exacerbation ----possibly related to attempt to wean risperidone Now seems stable again on higher (though still modest) dose Continues with the psychiatrist

## 2019-12-04 NOTE — Telephone Encounter (Signed)
[name] pts daughter (DPR signed) left v/m requesting cb about outcome of visit with pt today. [name] also would like to know if labs were done for K and kidney function.[name] request cb.

## 2019-12-04 NOTE — Assessment & Plan Note (Signed)
Seems quiet Hasn't needed the albuterol inhaler

## 2019-12-04 NOTE — Addendum Note (Signed)
Addended by: Ellamae Sia on: 12/04/2019 03:22 PM   Modules accepted: Orders

## 2019-12-04 NOTE — Telephone Encounter (Signed)
I tried to call and wasn't able to get through. Let her know that the visit went pretty well---though her mom still thinks she only went to the hospital for a blood test and not because there were problems with the paranoia, etc  Please apologize for me. I checked the labs and wasn't worried about the kidney function so was going to hold off on blood work--but I didn't notice the low potassium. I have ordered the needed blood work (which her mom knew she needed) now--just have to set up a time for her to come back. Again, I am very sorry---I blew that one!

## 2019-12-04 NOTE — Assessment & Plan Note (Signed)
Some cognitive issues Lacks insight into what happened in hospital, etc Maintains some functional independence while living with daughter

## 2019-12-04 NOTE — Addendum Note (Signed)
Addended by: Viviana Simpler I on: 12/04/2019 01:57 PM   Modules accepted: Orders

## 2019-12-04 NOTE — Progress Notes (Signed)
Subjective:    Patient ID: Natalie [name], female    DOB: 1950/11/17, 69 y.o.   MRN: [id number]  HPI Here for hospital follow up This visit occurred during the SARS-CoV-2 public health emergency.  Safety protocols were in place, including screening questions prior to the visit, additional usage of staff PPE, and extensive cleaning of exam room while observing appropriate contact time as indicated for disinfecting solutions.   Reviewed records She states "I went to get some blood work and a urine test" She doesn't remember any problems at home Records indicate daughter brought her for increased paranoia Spent the night in the hospital and then home  Back home--lives with daughter Does some work around the house Independent with ADLs  Not sad or depressed No apparent hallucinations Denies paranoid thoughts now  Current Outpatient Medications on File Prior to Visit  Medication Sig Dispense Refill  . albuterol (PROAIR HFA) 108 (90 Base) MCG/ACT inhaler Inhale 2 puffs into the lungs every 6 (six) hours as needed for wheezing or shortness of breath. 6.7 g 3  . cetirizine (ZYRTEC) 10 MG tablet Take 10 mg by mouth daily.      . risperiDONE (RISPERDAL) 1 MG tablet Take 1 tablet (1 mg total) by mouth at bedtime. 30 tablet 0  . traZODone (DESYREL) 50 MG tablet Take 1 tablet (50 mg total) by mouth at bedtime as needed for sleep. 30 tablet 0   No current facility-administered medications on file prior to visit.    No Known Allergies  Past Medical History:  Diagnosis Date  . Allergy   . Arthritis   . Asthma   . Cataract   . GERD (gastroesophageal reflux disease)    pt. denied  . Heart murmur    was told years ago  . History of shingles 2006  . Hyperlipidemia     Past Surgical History:  Procedure Laterality Date  . ABDOMINAL HYSTERECTOMY    . COLONOSCOPY    . POLYPECTOMY    . VAGINAL DELIVERY     x2    Family History  Problem Relation Age of Onset  . Arthritis Mother    . Diabetes Mother   . Hypertension Mother   . Liver cancer Mother   . Colon cancer Father 25  . Pancreatic cancer Brother   . Colon polyps Brother   . Breast cancer Daughter 52  . Alcohol abuse Other   . Heart disease Neg Hx   . Esophageal cancer Neg Hx   . Rectal cancer Neg Hx   . Stomach cancer Neg Hx     Social History   Socioeconomic History  . Marital status: Single    Spouse name: Not on file  . Number of children: 2  . Years of education: Not on file  . Highest education level: Not on file  Occupational History  . 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 Smoking status: Never Smoker  . Smokeless tobacco: Never Used  Substance and Sexual Activity  . Alcohol use: No  . Drug use: No  . Sexual activity: Not Currently  Other Topics Concern  . Not on file  Social History Narrative   No living will   Requests daughter Natalie [name] as health care POA   Would accept resuscitation but no prolonged machines   Not sure about tube feeds      Right handed      Currently lives with daughter  Completed HS   Social Determinants of Health   Financial Resource Strain: Low Risk   . Difficulty of Paying Living Expenses: Not very hard  Food Insecurity: Food Insecurity Present  . Worried About Charity fundraiser in the Last Year: Sometimes true  . Ran Out of Food in the Last Year: Sometimes true  Transportation Needs: Unmet Transportation Needs  . Lack of Transportation (Medical): Yes  . Lack of Transportation (Non-Medical): Yes  Physical Activity: Inactive  . Days of Exercise per Week: 0 days  . Minutes of Exercise per Session: 0 min  Stress:   . Feeling of Stress :   Social Connections: Unknown  . Frequency of Communication with Friends and Family: Not on file  . Frequency of Social Gatherings with Friends and Family: Not on file  . Attends Religious Services: Never  . Active Member of Clubs or Organizations:  No  . Attends Archivist Meetings: Never  . Marital Status: Never married  Intimate Partner Violence: Unknown  . Fear of Current or Ex-Partner: No  . Emotionally Abused: Not asked  . Physically Abused: No  . Sexually Abused: Not asked   Review of Systems She is driving again Sleeping fairly well Appetite "so-so" Has lost about 10# No cough or SOB--rarely needs the inhaler    Objective:   Physical Exam  Constitutional: No distress.  Neck: No thyromegaly present.  Cardiovascular: Normal rate, regular rhythm and normal heart sounds. Exam reveals no gallop.  No murmur heard. Respiratory: Effort normal and breath sounds normal. No respiratory distress. She has no wheezes. She has no rales.  GI: Soft. There is no abdominal tenderness.  Musculoskeletal:     Comments: Trace non pitting edema in calves  Lymphadenopathy:    She has no cervical adenopathy.  Psychiatric:  Mood is neutral with appropriate affect No apparent hallucinations or delusions           Assessment & Plan:

## 2019-12-05 LAB — RENAL FUNCTION PANEL
Albumin: 4 g/dL (ref 3.5–5.2)
BUN: 8 mg/dL (ref 6–23)
CO2: 30 mEq/L (ref 19–32)
Calcium: 9.1 mg/dL (ref 8.4–10.5)
Chloride: 105 mEq/L (ref 96–112)
Creatinine, Ser: 0.87 mg/dL (ref 0.40–1.20)
GFR: 77.68 mL/min (ref >60.00)
Glucose, Bld: 84 mg/dL (ref 70–99)
Phosphorus: 2.7 mg/dL (ref 2.3–4.6)
Potassium: 3.5 mEq/L (ref 3.5–5.1)
Sodium: 143 mEq/L (ref 135–145)

## 2019-12-12 ENCOUNTER — Telehealth: Payer: Self-pay

## 2019-12-12 NOTE — Telephone Encounter (Signed)
pt daughter called states that her mom will not leave her house and that she will lock her out of her own home and that she can't remember things.  she needs to know what to do.

## 2019-12-12 NOTE — Telephone Encounter (Signed)
Returned call to [employer] message that if she is having agitation, behavioral problems/psychosis to call 911 or to take her back to the nearest emergency department for an evaluation.

## 2019-12-17 ENCOUNTER — Ambulatory Visit: Payer: Medicare Other | Admitting: Neurology

## 2019-12-18 ENCOUNTER — Other Ambulatory Visit: Payer: Self-pay

## 2019-12-18 ENCOUNTER — Telehealth (INDEPENDENT_AMBULATORY_CARE_PROVIDER_SITE_OTHER): Payer: Medicare Other | Admitting: Psychiatry

## 2019-12-18 ENCOUNTER — Ambulatory Visit: Payer: Medicare Other | Admitting: Neurology

## 2019-12-18 ENCOUNTER — Encounter: Payer: Self-pay | Admitting: Neurology

## 2019-12-18 ENCOUNTER — Encounter: Payer: Self-pay | Admitting: Psychiatry

## 2019-12-18 VITALS — BP 146/85 | HR 86 | Resp 18 | Ht 63.0 in | Wt 183.0 lb

## 2019-12-18 DIAGNOSIS — G4701 Insomnia due to medical condition: Secondary | ICD-10-CM | POA: Diagnosis not present

## 2019-12-18 DIAGNOSIS — F29 Unspecified psychosis not due to a substance or known physiological condition: Secondary | ICD-10-CM | POA: Diagnosis not present

## 2019-12-18 DIAGNOSIS — F200 Paranoid schizophrenia: Secondary | ICD-10-CM | POA: Diagnosis not present

## 2019-12-18 DIAGNOSIS — F0670 Mild neurocognitive disorder due to known physiological condition without behavioral disturbance: Secondary | ICD-10-CM

## 2019-12-18 DIAGNOSIS — G3184 Mild cognitive impairment, so stated: Secondary | ICD-10-CM | POA: Diagnosis not present

## 2019-12-18 MED ORDER — TRAZODONE HCL 50 MG PO TABS: 50.0000 mg | ORAL_TABLET | Freq: Every evening | ORAL | 0 refills | Status: DC | PRN

## 2019-12-18 MED ORDER — RISPERIDONE 1 MG PO TABS: 1.0000 mg | ORAL_TABLET | Freq: Every day | ORAL | 0 refills | Status: DC

## 2019-12-18 NOTE — Patient Instructions (Signed)
1. Schedule Neurocognitive testing  2. Continue all your medications with family helping administer medications  3. Follow-up in 6 months, call for any changes  FALL PRECAUTIONS: Be cautious when walking. Scan the area for obstacles that may increase the risk of trips and falls. When getting up in the mornings, sit up at the edge of the bed for a few minutes before getting out of bed. Consider elevating the bed at the head end to avoid drop of blood pressure when getting up. Walk always in a well-lit room (use night lights in the walls). Avoid area rugs or power cords from appliances in the middle of the walkways. Use a walker or a cane if necessary and consider physical therapy for balance exercise. Get your eyesight checked regularly.  FINANCIAL OVERSIGHT: Supervision, especially oversight when making financial decisions or transactions is also recommended as difficulties arise.  HOME SAFETY: Consider the safety of the kitchen when operating appliances like stoves, microwave oven, and blender. Consider having supervision and share cooking responsibilities until no longer able to participate in those. Accidents with firearms and other hazards in the house should be identified and addressed as well.  DRIVING: Regarding driving, in patients with progressive memory problems, driving will be impaired. We advise to have someone else do the driving if trouble finding directions or if minor accidents are reported. Independent driving assessment is available to determine safety of driving.  ABILITY TO BE LEFT ALONE: If patient is unable to contact 911 operator, consider using LifeLine, or when the need is there, arrange for someone to stay with patients. Smoking is a fire hazard, consider supervision or cessation. Risk of wandering should be assessed by caregiver and if detected at any point, supervision and safe proof recommendations should be instituted.  MEDICATION SUPERVISION: Inability to  self-administer medication needs to be constantly addressed. Implement a mechanism to ensure safe administration of the medications.  RECOMMENDATIONS FOR ALL PATIENTS WITH MEMORY PROBLEMS: 1. Continue to exercise (Recommend 30 minutes of walking everyday, or 3 hours every week) 2. Increase social interactions - continue going to [place] and enjoy social gatherings with friends and family 3. Eat healthy, avoid fried foods and eat more fruits and vegetables 4. Maintain adequate blood pressure, blood sugar, and blood cholesterol level. Reducing the risk of stroke and cardiovascular disease also helps promoting better memory. 5. Avoid stressful situations. Live a simple life and avoid aggravations. Organize your time and prepare for the next day in anticipation. 6. Sleep well, avoid any interruptions of sleep and avoid any distractions in the bedroom that may interfere with adequate sleep quality 7. Avoid sugar, avoid sweets as there is a strong link between excessive sugar intake, diabetes, and cognitive impairment The Mediterranean diet has been shown to help patients reduce the risk of progressive memory disorders and reduces cardiovascular risk. This includes eating fish, eat fruits and green leafy vegetables, nuts like almonds and hazelnuts, walnuts, and also use olive oil. Avoid fast foods and fried foods as much as possible. Avoid sweets and sugar as sugar use has been linked to worsening of memory function.

## 2019-12-18 NOTE — Progress Notes (Signed)
Provider Location : [facility] Patient Location : Home  Virtual Visit via Telephone Note  I connected with Natalie [name] on 12/18/19 at 10:20 AM EDT by telephone and verified that I am speaking with the correct person using two identifiers.   I discussed the limitations, risks, security and privacy concerns of performing an evaluation and management service by telephone and the availability of in person appointments. I also discussed with the patient that there may be a patient responsible charge related to this service. The patient expressed understanding and agreed to proceed.    I discussed the assessment and treatment plan with the patient. The patient was provided an opportunity to ask questions and all were answered. The patient agreed with the plan and demonstrated an understanding of the instructions.   The patient was advised to call back or seek an in-person evaluation if the symptoms worsen or if the condition fails to improve as anticipated.  Natalie [name] OP Progress Note  12/18/2019 10:56 AM Natalie [name]  MRN:  [id number]  Chief Complaint:  Chief Complaint    Follow-up     HPI: Natalie [name] is a 69 year old African-American female, retired, currently lives with her daughter Natalie [name], has a history of psychosis unspecified, mild neurocognitive disorder, asthma, hypokalemia was evaluated by phone today.  Patient preferred to do a phone call.  Patient today appeared to be guarded in our conversation.  Patient however did not seem to be preoccupied with any delusions or paranoid ideations.  She was able to answer all questions appropriately.  Patient was noted as alert, oriented to person place time and situation.  Patient reports sleep is good.  Her appetite comes and goes however overall she is doing okay.  She denies any suicidality, homicidality or perceptual disturbances.  Patient reports she did have her neurology appointment this morning. I have reviewed neurology  consultation dated 12/18/2019 per Dr. Ellouise Newer - " patient with mild cognitive impairment, paranoid delusions.  Exam today nonfocal, no parkinsonian sign.   SLUMS -26 out of 30.  MRI brain no acute changes, there is mild diffuse atrophy and minimal chronic microvascular disease.  Etiology of cognitive impairment unclear, she continues to manage complex tasks when she is not having an acute episode of paranoia.  We discussed cholinesterase inhibitors such as Donepezil and agreed to proceed with neurocognitive testing first before starting medication as she is hesitant to start one.  We will schedule neurocognitive testing.  Continue all your medications with family helping administer medications.  Follow-up in 6 months.   Visit Diagnosis:    ICD-10-CM   1. Mild neurocognitive disorder due to multiple etiologies  G31.84   2. Psychosis, unspecified psychosis type ([place])  F29 risperiDONE (RISPERDAL) 1 MG tablet    traZODone (DESYREL) 50 MG tablet  3. Insomnia due to medical condition  G47.01 risperiDONE (RISPERDAL) 1 MG tablet    traZODone (DESYREL) 50 MG tablet    Past Psychiatric History: I have reviewed past psychiatric history from my progress note on 03/25/2019.  Past trials of Invega sustained, risperidone.  Past Medical History:  Past Medical History:  Diagnosis Date  . Allergy   . Arthritis   . Asthma   . Cataract   . GERD (gastroesophageal reflux disease)    pt. denied  . Heart murmur    was told years ago  . History of shingles 2006  . Hyperlipidemia     Past Surgical History:  Procedure Laterality Date  . ABDOMINAL HYSTERECTOMY    .  COLONOSCOPY    . POLYPECTOMY    . VAGINAL DELIVERY     x2    Family Psychiatric History: I have reviewed family psychiatric history from my progress note on 03/25/2019.   Family History:  Family History  Problem Relation Age of Onset  . Arthritis Mother   . Diabetes Mother   . Hypertension Mother   . Liver cancer Mother   . Colon cancer  Father 24  . Pancreatic cancer Brother   . Colon polyps Brother   . Breast cancer Daughter 68  . Alcohol abuse Other   . Heart disease Neg Hx   . Esophageal cancer Neg Hx   . Rectal cancer Neg Hx   . Stomach cancer Neg Hx     Social History: I have reviewed social history from my progress note on 03/25/2019. Social History   Socioeconomic History  . Marital status: Single    Spouse name: Not on file  . Number of children: 2  . Years of education: Not on file  . Highest education level: Not on file  Occupational History  . 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 Smoking status: Never Smoker  . Smokeless tobacco: Never Used  Substance and Sexual Activity  . Alcohol use: No  . Drug use: No  . Sexual activity: Not Currently  Other Topics Concern  . Not on file  Social History Narrative   No living will   Requests daughter Natalie [name] as health care POA   Would accept resuscitation but no prolonged machines   Not sure about tube feeds      Right handed      Currently lives with daughter      Completed HS   Social Determinants of Health   Financial Resource Strain: Low Risk   . Difficulty of Paying Living Expenses: Not very hard  Food Insecurity: Food Insecurity Present  . Worried About Charity fundraiser in the Last Year: Sometimes true  . Ran Out of Food in the Last Year: Sometimes true  Transportation Needs: Unmet Transportation Needs  . Lack of Transportation (Medical): Yes  . Lack of Transportation (Non-Medical): Yes  Physical Activity: Inactive  . Days of Exercise per Week: 0 days  . Minutes of Exercise per Session: 0 min  Stress:   . Feeling of Stress :   Social Connections: Unknown  . Frequency of Communication with Friends and Family: Not on file  . Frequency of Social Gatherings with Friends and Family: Not on file  . Attends Religious Services: Never  . Active Member of Clubs or Organizations: No   . Attends Archivist Meetings: Never  . Marital Status: Never married    Allergies: No Known Allergies  Metabolic Disorder Labs: No results found for: HGBA1C, MPG No results found for: PROLACTIN Lab Results  Component Value Date   CHOL 228 (H) 05/14/2018   TRIG 167.0 (H) 05/14/2018   HDL 48.60 05/14/2018   CHOLHDL 5 05/14/2018   VLDL 33.4 05/14/2018   LDLCALC 146 (H) 05/14/2018   LDLCALC 161 (H) 04/11/2017   Lab Results  Component Value Date   TSH 1.448 03/04/2019   TSH 0.952 04/04/2012    Therapeutic Level Labs: No results found for: LITHIUM No results found for: VALPROATE No components found for:  CBMZ  Current Medications: Current Outpatient Medications  Medication Sig Dispense Refill  . albuterol (PROAIR HFA) 108 (90 Base) MCG/ACT inhaler Inhale  2 puffs into the lungs every 6 (six) hours as needed for wheezing or shortness of breath. 6.7 g 3  . cetirizine (ZYRTEC) 10 MG tablet Take 10 mg by mouth daily.      . risperiDONE (RISPERDAL) 1 MG tablet Take 1 tablet (1 mg total) by mouth at bedtime. 90 tablet 0  . traZODone (DESYREL) 50 MG tablet Take 1 tablet (50 mg total) by mouth at bedtime as needed for sleep. 90 tablet 0   No current facility-administered medications for this visit.     Musculoskeletal: Strength & Muscle Tone: UTA Gait & Station: UTA Patient leans: N/A  Psychiatric Specialty Exam: Review of Systems  Psychiatric/Behavioral: Negative for agitation, behavioral problems, confusion, decreased concentration, dysphoric mood, hallucinations, self-injury, sleep disturbance and suicidal ideas. The patient is not nervous/anxious and is not hyperactive.   All other systems reviewed and are negative.   There were no vitals taken for this visit.There is no height or weight on file to calculate BMI.  General Appearance: UTA  Eye Contact:  UTA  Speech:  Clear and Coherent  Volume:  Normal  Mood:  Euthymic  Affect:  UTA  Thought Process:  Goal  Directed and Descriptions of Associations: Intact  Orientation:  Full (Time, Place, and Person)  Thought Content: Logical   Suicidal Thoughts:  No  Homicidal Thoughts:  No  Memory:  Immediate;   Fair Recent;   Fair Remote;   Limited  Judgement:  Fair  Insight:  Fair  Psychomotor Activity:  UTA  Concentration:  Concentration: Fair and Attention Span: Fair  Recall:  [employer] of Knowledge: Fair  Language: Fair  Akathisia:  No  Handed:  Right  AIMS (if indicated): UTA  Assets:  Communication Skills Desire for Improvement Housing Social Support  ADL's:  Intact  Cognition: Impaired , unspecified  Sleep:  Fair   Screenings: AUDIT     Admission (Discharged) from 11/18/2019 in [place]  Alcohol Use Disorder Identification Test Final Score (AUDIT)  0    PHQ2-9     Office Visit from 12/18/2019 in [facility] Office Visit from 05/17/2019 in [place] at [facility] Visit from 04/11/2017 in [place] at [facility] Visit from 04/06/2016 in [place] at [facility] Visit from 04/06/2015 in [place] at [facility] Total Score  0  0  0  0  0       Assessment and Plan: Natalie [name] is a 69 year old African-American female, single, currently lives with her daughter Natalie [name], has a history of mild cognitive disorder, psychosis unspecified, likely secondary to cognitive issues, asthma, hypokalemia was evaluated by phone today.  Patient was recently discharged from inpatient mental health facility at [facility] after an episode of psychosis.  Patient continues to have on and off paranoia however today appeared to be alert oriented and denied any paranoia or delusions.  Patient will continue to benefit from medication management as well as neurology follow-ups.  Plan as noted below.  Plan Mild cognitive disorder/unspecified-she will continue to follow-up with neurology. I have reviewed medical records in  [facility] dated 12/18/2019-per Dr.Aquino as summarized above.  Patient has been scheduled for neurological testing. Patient was offered anticholinesterase inhibitor/Aricept however patient declined and wants to wait until testing is completed.  Psychosis unspecified likely secondary to neurocognitive issues-improving Risperidone 1 mg p.o. nightly. Encouraged compliance. Patient had 2 inpatient mental health admissions-at her first admission to [facility] she was discharged with  a diagnosis of schizophrenia spectrum unspecified/psychosis unspecified.  Patient does have on and off paranoia however patient's with early neurocognitive issues can have psychosis at least 2 years prior to a formal diagnosis of dementia.  Unknown if she has primary psychopathology like schizophrenia versus her psychosis is likely secondary to her neurocognitive issues.  We will monitor closely.  Insomnia-stable Trazodone 50 mg p.o. nightly  Follow-up in clinic in 3 to 5 weeks or sooner if needed.  I have spent atleast 30 minutes non face to face with patient today. More than 50 % of the time was spent for preparing to see the patient ( e.g., review of test, records ), obtaining and to review and separately obtained history , ordering medications and test ,psychoeducation and supportive psychotherapy and care coordination,as well as documenting clinical information in electronic health record. This note was generated in part or whole with voice recognition software. Voice recognition is usually quite accurate but there are transcription errors that can and very often do occur. I apologize for any typographical errors that were not detected and corrected.       Ursula Alert, [name] 12/18/2019, 10:56 AM
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
## 2020-01-14 ENCOUNTER — Other Ambulatory Visit: Payer: Self-pay

## 2020-01-14 ENCOUNTER — Telehealth (INDEPENDENT_AMBULATORY_CARE_PROVIDER_SITE_OTHER): Payer: Medicare Other | Admitting: Psychiatry

## 2020-01-14 ENCOUNTER — Encounter: Payer: Self-pay | Admitting: Psychiatry

## 2020-01-14 DIAGNOSIS — G3184 Mild cognitive impairment, so stated: Secondary | ICD-10-CM | POA: Diagnosis not present

## 2020-01-14 DIAGNOSIS — G4701 Insomnia due to medical condition: Secondary | ICD-10-CM

## 2020-01-14 DIAGNOSIS — Z8659 Personal history of other mental and behavioral disorders: Secondary | ICD-10-CM | POA: Diagnosis not present

## 2020-01-14 DIAGNOSIS — F0670 Mild neurocognitive disorder due to known physiological condition without behavioral disturbance: Secondary | ICD-10-CM

## 2020-01-14 NOTE — Progress Notes (Signed)
Provider Location : [facility] Patient Location : [facility] Visit via Telephone Note  I connected with Natalie [name] on 01/14/20 at  2:40 PM EDT by telephone and verified that I am speaking with the correct person using two identifiers.   I discussed the limitations, risks, security and privacy concerns of performing an evaluation and management service by telephone and the availability of in person appointments. I also discussed with the patient that there may be a patient responsible charge related to this service. The patient expressed understanding and agreed to proceed.    I discussed the assessment and treatment plan with the patient. The patient was provided an opportunity to ask questions and all were answered. The patient agreed with the plan and demonstrated an understanding of the instructions.   The patient was advised to call back or seek an in-person evaluation if the symptoms worsen or if the condition fails to improve as anticipated.   Natalie [name]  Chief Complaint:  Chief Complaint    Follow-up     HPI: Natalie [name] is a 69 year old African-American female, retired, has a history of psychosis unspecified, mild neurocognitive disorder, asthma, hypokalemia was evaluated by phone today.  Patient preferred to do a phone call.  Patient today reports she currently lives in an apartment in [place].  She reports she moved recently back to this apartment.  She however reports she continues to go back and forth between her place and also her daughter [name]'s place.  She slept well last night at her daughter's place.  She continues to have good support system from her daughter.  She reports mood wise she is doing okay.  She denies any significant mood lability.  She reports sleep is good.  She reports she sleeps better when she takes 1/2 tablet of trazodone and she likes it better than the whole  tablet.  She is compliant on her medication as prescribed.  Denies side effects.  She appeared to be alert, oriented to person place time and situation.  Patient appeared to be pleasant and was able to answer all questions appropriately.  She did not appear to be guarded and did not seem to be preoccupied with any delusions or paranoia.  She reports she continues to follow-up with neurology and has upcoming appointment scheduled.  Visit Diagnosis:    ICD-10-CM   1. Mild neurocognitive disorder due to multiple etiologies  G31.84   2. History of psychosis  Z86.59   3. Insomnia due to medical condition  G47.01     Past Psychiatric History: I have reviewed past psychiatric history from my progress note on 03/25/2019.  Past trials of Invega Sustenna, risperidone  Past Medical History:  Past Medical History:  Diagnosis Date  . Allergy   . Arthritis   . Asthma   . Cataract   . GERD (gastroesophageal reflux disease)    pt. denied  . Heart murmur    was told years ago  . History of shingles 2006  . Hyperlipidemia     Past Surgical History:  Procedure Laterality Date  . ABDOMINAL HYSTERECTOMY    . COLONOSCOPY    . POLYPECTOMY    . VAGINAL DELIVERY     x2    Family Psychiatric History: I have reviewed family psychiatric history from my progress note on 03/25/2019  Family History:  Family History  Problem Relation Age of Onset  . Arthritis  Mother   . Diabetes Mother   . Hypertension Mother   . Liver cancer Mother   . Colon cancer Father 43  . Pancreatic cancer Brother   . Colon polyps Brother   . Breast cancer Daughter 60  . Alcohol abuse Other   . Heart disease Neg Hx   . Esophageal cancer Neg Hx   . Rectal cancer Neg Hx   . Stomach cancer Neg Hx     Social History: Reviewed social history from my progress note on 03/25/2019 Social History   Socioeconomic History  . Marital status: Single    Spouse name: Not on file  . Number of children: 2  . Years of  education: Not on file  . Highest education level: Not on file  Occupational History  . 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 Smoking status: Never Smoker  . Smokeless tobacco: Never Used  Substance and Sexual Activity  . Alcohol use: No  . Drug use: No  . Sexual activity: Not Currently  Other Topics Concern  . Not on file  Social History Narrative   No living will   Requests daughter [name] as health care POA   Would accept resuscitation but no prolonged machines   Not sure about tube feeds      Right handed      Currently lives with daughter      Completed HS   Social Determinants of Health   Financial Resource Strain: Low Risk   . Difficulty of Paying Living Expenses: Not very hard  Food Insecurity: Food Insecurity Present  . Worried About Charity fundraiser in the Last Year: Sometimes true  . Ran Out of Food in the Last Year: Sometimes true  Transportation Needs: Unmet Transportation Needs  . Lack of Transportation (Medical): Yes  . Lack of Transportation (Non-Medical): Yes  Physical Activity: Inactive  . Days of Exercise per Week: 0 days  . Minutes of Exercise per Session: 0 min  Stress:   . Feeling of Stress :   Social Connections: Unknown  . Frequency of Communication with Friends and Family: Not on file  . Frequency of Social Gatherings with Friends and Family: Not on file  . Attends Religious Services: Never  . Active Member of Clubs or Organizations: No  . Attends Archivist Meetings: Never  . Marital Status: Never married    Allergies: No Known Allergies  Metabolic Disorder Labs: No results found for: HGBA1C, MPG No results found for: PROLACTIN Lab Results  Component Value Date   CHOL 228 (H) 05/14/2018   TRIG 167.0 (H) 05/14/2018   HDL 48.60 05/14/2018   CHOLHDL 5 05/14/2018   VLDL 33.4 05/14/2018   LDLCALC 146 (H) 05/14/2018   LDLCALC 161 (H) 04/11/2017   Lab Results   Component Value Date   TSH 1.448 03/04/2019   TSH 0.952 04/04/2012    Therapeutic Level Labs: No results found for: LITHIUM No results found for: VALPROATE No components found for:  CBMZ  Current Medications: Current Outpatient Medications  Medication Sig Dispense Refill  . albuterol (PROAIR HFA) 108 (90 Base) MCG/ACT inhaler Inhale 2 puffs into the lungs every 6 (six) hours as needed for wheezing or shortness of breath. 6.7 g 3  . cetirizine (ZYRTEC) 10 MG tablet Take 10 mg by mouth daily.      . risperiDONE (RISPERDAL) 1 MG tablet Take 1 tablet (1 mg total) by mouth at  bedtime. 90 tablet 0  . traZODone (DESYREL) 50 MG tablet Take 1 tablet (50 mg total) by mouth at bedtime as needed for sleep. 90 tablet 0   No current facility-administered medications for this visit.     Musculoskeletal: Strength & Muscle Tone: UTA Gait & Station: UTA Patient leans: N/A  Psychiatric Specialty Exam: Review of Systems  Psychiatric/Behavioral: Negative for agitation, behavioral problems, confusion, decreased concentration, dysphoric mood, hallucinations, self-injury, sleep disturbance and suicidal ideas. The patient is not nervous/anxious and is not hyperactive.   All other systems reviewed and are negative.   There were no vitals taken for this visit.There is no height or weight on file to calculate BMI.  General Appearance: UTA  Eye Contact:  UTA  Speech:  Clear and Coherent  Volume:  Normal  Mood:  Euthymic  Affect:  UTA  Thought Process:  Goal Directed and Descriptions of Associations: Intact  Orientation:  Full (Time, Place, and Person)  Thought Content: Logical   Suicidal Thoughts:  No  Homicidal Thoughts:  No  Memory:  Immediate;   Fair Recent;   Fair Remote;   Fair  Judgement:  Fair  Insight:  Fair  Psychomotor Activity:  UTA  Concentration:  Concentration: Fair and Attention Span: Fair  Recall:  [employer] of Knowledge: Fair  Language: Fair  Akathisia:  No  Handed:   Right  AIMS (if indicated): UTA  Assets:  Communication Skills Desire for Improvement Housing Social Support  ADL's:  Intact  Cognition: WNL  Sleep:  Fair   Screenings: AUDIT     Admission (Discharged) from 11/18/2019 in [place]  Alcohol Use Disorder Identification Test Final Score (AUDIT)  0    PHQ2-9     Office Visit from 12/18/2019 in [facility] [place] Office Visit from 05/17/2019 in [place] at [facility] Visit from 04/11/2017 in [place] at [facility] Visit from 04/06/2016 in [place] at [facility] Visit from 04/06/2015 in [place] at [facility]  PHQ-2 Total Score  0  0  0  0  0       Assessment and Plan: NAOMEE NOWLAND is a 69 year old African-American female, single, currently lives with her daughter [name], has a history of cognitive disorder, psychosis unspecified likely secondary to cognitive issues, asthma, hypokalemia was evaluated by phone today.  Patient today to be alert, pleasant and reports being stable on medications.  Plan as noted below.  Plan  Mild neurocognitive disorder/unspecified-she will continue to follow up with neurology.  She does have upcoming appointment.  Psychosis-resolved We will continue risperidone 1 mg p.o. nightly for now. However if she continues to do well she can be tapered off of it.   Insomnia-stable Trazodone 25 to 50 mg p.o. nightly as needed  Follow-up in clinic in 2 to 3 months or sooner if needed.  I have spent atleast 20 minutes non face to face with patient today. More than 50 % of the time was spent for preparing to see the patient ( e.g., review of test, records ), ordering medications and test ,psychoeducation and supportive psychotherapy and care coordination,as well as documenting clinical information in electronic health record. This note was generated in part or whole with voice recognition software. Voice recognition  is usually quite accurate but there are transcription errors that can and very often do occur. I apologize for any typographical errors that were not detected and corrected.       Phynix Horton  Christien Frankl, MD 01/14/2020, 2:54 PM

## 2020-01-20 ENCOUNTER — Ambulatory Visit: Payer: Medicare Other

## 2020-01-20 ENCOUNTER — Ambulatory Visit: Payer: Medicare Other | Admitting: Counselor

## 2020-01-20 ENCOUNTER — Encounter: Payer: Self-pay | Admitting: Counselor

## 2020-01-20 ENCOUNTER — Other Ambulatory Visit: Payer: Self-pay

## 2020-01-20 DIAGNOSIS — F09 Unspecified mental disorder due to known physiological condition: Secondary | ICD-10-CM | POA: Diagnosis not present

## 2020-01-20 DIAGNOSIS — F0670 Mild neurocognitive disorder due to known physiological condition without behavioral disturbance: Secondary | ICD-10-CM

## 2020-01-20 DIAGNOSIS — F29 Unspecified psychosis not due to a substance or known physiological condition: Secondary | ICD-10-CM

## 2020-01-20 NOTE — Progress Notes (Signed)
   Psychometrist Note   Cognitive testing was administered to Natalie [name] by Lamar Benes, B.S. (Technician) under the supervision of Alphonzo Severance, Psy.D., ABN. [name] was able to tolerate all test procedures. Dr. Nicole Kindred met with the patient as needed to manage any emotional reactions to the testing procedures (if applicable). Rest breaks were offered.    The battery of tests administered was selected by Dr. Nicole Kindred with consideration to the patient's current level of functioning, the nature of her symptoms, emotional and behavioral responses during the interview, level of literacy, observed level of motivation/effort, and the nature of the referral question. This battery was communicated to the psychometrist. Communication between Dr. Nicole Kindred and the psychometrist was ongoing throughout the evaluation and Dr. Nicole Kindred was immediately accessible at all times. Dr. Nicole Kindred provided supervision to the technician on the date of this service, to the extent necessary to assure the quality of all services provided.    Ms. Gavel will return in approximately one week for an interactive feedback session with Dr. Nicole Kindred, at which time female test performance, clinical impressions, and treatment recommendations will be reviewed in detail. The patient understands she can contact our office should she require our assistance before this time.   A total of 110 minutes of billable time were spent with Natalie [name] by the technician, including test administration and scoring time. Billing for these services is reflected in Dr. Les Pou note.   This note reflects time spent with the psychometrician and does not include test scores, clinical history, or any interpretations made by Dr. Nicole Kindred. The full report will follow in a separate note.
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
## 2020-01-22 NOTE — Progress Notes (Signed)
[facility]  Patient Name: Natalie [name] MRN: [id number] Date of Birth: 1950-11-26 Age: 69 y.o. Education: 12 years  Measurement properties of test scores: IQ, Index, and Standard Scores (SS): Mean = 100; Standard Deviation = 15 Scaled Scores (Ss): Mean = 10; Standard Deviation = 3 Z scores (Z): Mean = 0; Standard Deviation = 1 T scores (T); Mean = 50; Standard Deviation = 10  TEST SCORES:    Note: This summary of test scores accompanies the interpretive report and should not be interpreted by unqualified individuals or in isolation without reference to the report. Test scores are relative to age, gender, and educational history as available and appropriate.   Performance Validity        The Dot Counting Test: Raw Descriptor      E-Score 9 Within Expectation  "A" Random Letter Test Errors 0 Within Expectation      Embedded Measures: Raw Descriptor      RBANS Effort Index: 0 Within Expectation      WAIS-IV Reliable Digit Span: 7 Within Expectation      WAIS-IV Reliable Digit Span Revised 12 Within Expectation      Expected Functioning        Wide Range Achievement Test (Word Reading): Standard/Scaled Score Percentile       Word Reading 85 16      Cognitive Testing        RBANS, Form : Standard/Scaled Score Percentile  Total Score 101 53  Immediate Memory 94 34      List Learning 12 75      Story Memory 6 9  Visuospatial/Constructional 102 55      Figure Copy   (20) 14 91      Line Orientation --- 17-25  Language 101 53      Picture Naming --- 51-75      Semantic Fluency 10 50  Attention 94 34      Digit Span 10 50      Coding 8 25  Delayed Memory 117 87      List Recall   (6) --- 51-75      List Recognition   (19) --- 26-50      Story Recall   (10) 12 75      Figure Recall   (18) 14 91      Wechsler Adult Intelligence Scale - IV: Standard/Scaled Score Percentile  Working Memory Index 89 23      Digit Span 8 25           Digit Span Forward 8 25          Digit Span Backward 6 9          Digit Span Sequencing 11 63      Arithmetic 8 25  Processing Speed Index 92 30      Symbol Search 9 37      Coding 8 25      Neuropsychological Assessment Battery (Language Module): T-score Percentile      Naming 59 82      Verbal Fluency: T-score Percentile      Controlled Oral Word Association (F-A-S) 37 9      Semantic Fluency (Animals) 55 69      Trail Making Test: T-Score Percentile      Part A 47 38      Part B 49 46      Modified Wisconsin Card Sorting Test (MWCST): Standard/T-Score Percentile  Number of Categories Correct 20 1      Number of Perseverative Errors 20 1      Number of Total Errors 21 1      Percent Perseverative Errors 27 2  Executive Function Composite 54 1      Boston Diagnostic Aphasia Exam: Raw Score Scaled Score      Complex Ideational Material 11 1      Clock Drawing Raw Score Descriptor      Command 10 WNL      Rating Scales         Raw Score Descriptor  Patient Health Questionnaire - 9 0 WNL  GAD-7 0 Minimal  Quick Dementia Rating System        Sum of Boxes 3.5 Very Mild Dementia      Total Score 5 MCI     Lokelani Lutes V. Nicole Kindred PsyD, [place] Clinical Neuropsychologist

## 2020-01-23 NOTE — Progress Notes (Signed)
[facility]  Patient Name: Natalie [name] MRN: [id number] Date of Birth: 1951/08/03 Age: 69 y.o. Education: 12 years  Clinical Impressions  Natalie [name] is a 69 y.o., right-hand dominant, single woman with a history of paranoia, auditory hallucinations, and disorganized behavior culminating in two recent psychiatric admissions over the past two years. Her daughter has noticed some difficulties with paranoia and disordered thinking over the past 9 years but did not see her mother every day before that. If the patient was having symptoms, they were not affecting her functioning much because she has no prior history of hospitalization, had not consulted with psychiatry or psychology, and she was still working. When she is not psychotic, she is reportedly fairly independent with management of complex activities, such as finances, although she does repeat things from time to time. MRI is nondiagnostic and shows mild volume loss and no significant leukoaraiosis.   Natalie [name] on neuropsychological testing was, for the most part, fairly intact. Specifically, she demonstrated [name] at the level of expectations in all areas with the exception of executive functioning, which is impaired. She had difficulties on [employer] and on generation of words in response to the letters F-A-S. She denied any psychiatric symptoms whatsoever, which suggests guardedness, because clearly she has had some issues in that regard. She presented as having very poor insight. Her daughter rated her as functioning at an MCI to very mild dementia level.   Natalie [name] is thus demonstrating a mild cognitive impairment level problem. Cognitive deficits are associated with aging in schizophrenia and major mental illness and those conditions could be explanatory; however, degenerative conditions remain within the differential given that her  psychotic symptoms seem to have started later in life. Neurodegeneration may present with neuropsychiatric as opposed to cognitive problems. It is also possible that she had some earlier difficulties that were simply not noted by her daughter.   Diagnostic Impressions: Unspecified psychotic disorder Rule out dementia with psychosis  Recommendations to be discussed with patient  Your [name] and presentation on assessment today were consistent with no more than mild cognitive problems; specifically, you did well on most measures but you did have scores below expectations for you on measures of executive function. Executive function is not one thing but involves numerous component skills including problem solving, cognitive flexibility, and concept formation. There are also many non cognitive executive functions including judgment, foresight, inhibiting behavior and the like.   The cause of your cognitive and psychiatric problems is not entirely clear. Most people with delusions and hallucinations due to psychiatric symptoms, such as schizophrenia, develop the condition earlier in life. It is always concerning when individuals develop psychotic symptoms later in life, because they can be the sign of an emerging degenerative condition. In your case, you have had problems with delusions and low-grade psychiatric symptoms for at least 9 years but we do not know of problems before that. It is possible that you were having some difficulties that simply went unnoticed but it is equally possible that your psychotic symptoms developed later in life because you have a brain disease. If that is the case, then there will be progression over time  Dementia is typically associated with cognitive decline (I.e., memory and thinking problems), but it can involve a host of other behavioral and psychiatric symptoms. These symptoms are broadly known as "neuropsychiatric symptoms" because they are symptoms like we see in  psychiatric illness but they are due to underlying  changes in the brain. Things like depression, profound apathy (i.e., not wanting to do anything, diminished initiation of behavior), disinhibited and/or inappropriate behavior, delusions, and hallucinations can all be caused by the changes in the brain that accompany dementia. The most effective treatment for these symptoms is to deal with them behaviorally, that is by family members providing reassurance, not being alarmed, and realizing that they are just another part of the disease process. A small amount of reassurance often goes a long way. Distracting your loved one from what it is that is bothering them can also be helpful. If these symptoms become very impairing or sufficiently concerning to you or your family members or if they interfere with your ability to live safely at home, then treatment with psychiatric medications such as antipsychotics may be warranted.   Regardless of the cause of your psychotic symptoms, it is imperative that you continue to pursue psychiatric stability through compliance with your medication regimen and following with a psychiatrist. You are already seeing Dr. [name] and I would suggest that you continue. I do not think there is much of a role for therapy given that you do not have good insight into the changes.   With respect to driving, it is not clear from a cognitive perspective that you are necessarily unsafe to drive. There may be concerns from a psychiatric perspective, when you are destabilized, but I defer to Dr. [name] in that regard.   I would recommend that you allow family members to provide you assistance as needed, such as by attending medical appointments, and with medical and financial decisions of high risk. The concern would be that at times your thinking is not reality based, and this can interfere with symptom reporting and treatment adherence.   You could return for retesting in 1 to 2 years as  clinically indicated so we can follow your progress over time. If your issues are due to neurodegeneration, that will become clear over time.    Test Findings  Test scores are summarized in additional documentation associated with this encounter. Test scores are relative to age, gender, and educational history as available and appropriate. There were no concerns about [name] validity as all findings fell within normal expectations.   General Intellectual Functioning/Achievement:  [name] on single word reading fell at an average level, which presents as a reasonable standard of comparison for Natalie [name]'s cognitive test [name].   Attention and Processing Efficiency: [name] on indicators of attention and processing efficiency was within normal limits with scores hovering around the lower part of the average range at an index level. [name] was average on digit repetition forward and digit resequencing in ascending order. Digit repetition backward was unusually low. Mental solving of arithmetical word problems without paper and pencil was average.   With respect to processing speed, average scores were obtained on indicators of timed number-symbol coding and on a symbol matching to sample task involving efficient visual matching and efficient visual scanning.   Language: Language findings revealed intact visual object confrontation naming. Semantic fluency was average but phonemic fluency was low average (almost unusually low), which may be on the basis of executive issues.   Visuospatial Function: [name] on visuospatial and constructional indicators fell at a reasonable, average level overall. Copy of a line drawing was average as was judgment of angular line orientations.   Learning and Memory: [name] on measures of memory and learning was, by and large, within normal limits. There was a bit of encoding inconsistency  on measures of verbal memory that may  reflect cognitive efficiency and executive control factors.   In the verbal realm, memory for a 10-item word list was average across four repetitions but immediate recall of a short story was unusually low. Following a standard delay, she retained the words from the list well generating an average to high average score. Recognition for words from the list versus false choices was comparable and within the average range. She also retained the story well, achieving a score toward the top of the average range for delayed recall.   In the visual realm, delayed recall for a modestly complex geometric figure was superior.   Executive Functions: [name] on executive measures was concerning for some decline, with several findings below the level of expectations. Timed production of words in response to the letters F-A-S was weak and fell at the margin of the low average and unusually low ranges. Alternating sequencing of numbers and letters of the alphabet was average. By contrast, her modified [employer] generated an extremely low executive function composite with an extreme level of perseverative errors. Clock drawing and reasoning with verbal information were within normal limits.   Rating Scale(s): Natalie [name] daughter characterized her as falling from an MCI to mild dementia level; MCI presents as the best severity estimate given her presentation and test data. She denied any psychiatric symptoms whatsoever, which likely reflects guardedness because she is clearly having some problems.   Viviano Simas Nicole Kindred PsyD, [place] Clinical Neuropsychologist

## 2020-01-27 ENCOUNTER — Encounter: Payer: Medicare Other | Admitting: Counselor

## 2020-01-27 ENCOUNTER — Other Ambulatory Visit: Payer: Self-pay

## 2020-03-16 ENCOUNTER — Encounter: Payer: Self-pay | Admitting: Psychiatry

## 2020-03-16 ENCOUNTER — Telehealth (INDEPENDENT_AMBULATORY_CARE_PROVIDER_SITE_OTHER): Payer: Medicare Other | Admitting: Psychiatry

## 2020-03-16 ENCOUNTER — Other Ambulatory Visit: Payer: Self-pay

## 2020-03-16 DIAGNOSIS — Z8659 Personal history of other mental and behavioral disorders: Secondary | ICD-10-CM

## 2020-03-16 DIAGNOSIS — G3184 Mild cognitive impairment, so stated: Secondary | ICD-10-CM | POA: Diagnosis not present

## 2020-03-16 DIAGNOSIS — F0670 Mild neurocognitive disorder due to known physiological condition without behavioral disturbance: Secondary | ICD-10-CM

## 2020-03-16 DIAGNOSIS — G4701 Insomnia due to medical condition: Secondary | ICD-10-CM

## 2020-03-16 MED ORDER — TRAZODONE HCL 50 MG PO TABS: 50.0000 mg | ORAL_TABLET | Freq: Every evening | ORAL | 0 refills | Status: DC | PRN

## 2020-03-16 MED ORDER — RISPERIDONE 1 MG PO TABS: 1.0000 mg | ORAL_TABLET | Freq: Every day | ORAL | 0 refills | Status: DC

## 2020-03-16 NOTE — Progress Notes (Signed)
Provider Location : [facility] Patient Location : [facility] Visit via Telephone Note  I connected with Natalie [name] on 03/16/20 at  2:00 PM EDT by telephone and verified that I am speaking with the correct person using two identifiers.   I discussed the limitations, risks, security and privacy concerns of performing an evaluation and management service by telephone and the availability of in person appointments. I also discussed with the patient that there may be a patient responsible charge related to this service. The patient expressed understanding and agreed to proceed.     I discussed the assessment and treatment plan with the patient. The patient was provided an opportunity to ask questions and all were answered. The patient agreed with the plan and demonstrated an understanding of the instructions.   The patient was advised to call back or seek an in-person evaluation if the symptoms worsen or if the condition fails to improve as anticipated.   [facility] MD OP Progress Note  03/16/2020 2:13 PM Natalie [name]  MRN:  [id number]  Chief Complaint:  Chief Complaint    Follow-up     HPI: Natalie [name] is a 69 year old African-American female, retired, has a history of psychosis unspecified, mild neurocognitive disorder, asthma, hypokalemia was evaluated by phone today.  Patient preferred to do a phone call.  Patient today reports she currently lives in [place] by herself.  She moved out of her daughter's place few months ago.  She reports so far she is doing well and is able to take care of herself.  She reports mood wise she is doing okay.  Denies any significant depression or anxiety.  She reports sleep is good.  She continues to take trazodone as needed.  She denies any significant memory problems.  Patient with history of chronic paranoia however she denies any paranoia today and did not appear to be preoccupied with any delusions today.  She denies any auditory or  visual hallucinations.  Patient reports she is compliant on medications as prescribed.  She denies side effects.  Patient denies any other concerns today. Visit Diagnosis:    ICD-10-CM   1. Mild neurocognitive disorder due to multiple etiologies  G31.84   2. Insomnia due to medical condition  G47.01 traZODone (DESYREL) 50 MG tablet    risperiDONE (RISPERDAL) 1 MG tablet  3. History of psychosis  Z86.59     Past Psychiatric History: I have reviewed past psychiatric history from my progress note on 03/25/2019.  Past trials of Invega Sustenna, risperidone  Past Medical History:  Past Medical History:  Diagnosis Date  . Allergy   . Arthritis   . Asthma   . Cataract   . GERD (gastroesophageal reflux disease)    pt. denied  . Heart murmur    was told years ago  . History of shingles 2006  . Hyperlipidemia     Past Surgical History:  Procedure Laterality Date  . ABDOMINAL HYSTERECTOMY    . COLONOSCOPY    . POLYPECTOMY    . VAGINAL DELIVERY     x2    Family Psychiatric History: I have reviewed family psychiatric history from my progress note on 03/25/2019  Family History:  Family History  Problem Relation Age of Onset  . Arthritis Mother   . Diabetes Mother   . Hypertension Mother   . Liver cancer Mother   . Colon cancer Father 67  . Pancreatic cancer Brother   . Colon polyps Brother   . Breast  cancer Daughter 52  . Alcohol abuse Other   . Heart disease Neg Hx   . Esophageal cancer Neg Hx   . Rectal cancer Neg Hx   . Stomach cancer Neg Hx     Social History: I have reviewed social history from my progress note on 03/25/2019 Social History   Socioeconomic History  . Marital status: Single    Spouse name: Not on file  . Number of children: 2  . Years of education: Not on file  . Highest education level: Not on file  Occupational History  . 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 Smoking status: Never  Smoker  . Smokeless tobacco: Never Used  Vaping Use  . Vaping Use: Never used  Substance and Sexual Activity  . Alcohol use: No  . Drug use: No  . Sexual activity: Not Currently  Other Topics Concern  . Not on file  Social History Narrative   No living will   Requests daughter [name] as health care POA   Would accept resuscitation but no prolonged machines   Not sure about tube feeds      Right handed      Currently lives with daughter      Completed HS   Social Determinants of Health   Financial Resource Strain: Low Risk   . Difficulty of Paying Living Expenses: Not very hard  Food Insecurity: Food Insecurity Present  . Worried About Charity fundraiser in the Last Year: Sometimes true  . Ran Out of Food in the Last Year: Sometimes true  Transportation Needs: Unmet Transportation Needs  . Lack of Transportation (Medical): Yes  . Lack of Transportation (Non-Medical): Yes  Physical Activity: Inactive  . Days of Exercise per Week: 0 days  . Minutes of Exercise per Session: 0 min  Stress:   . Feeling of Stress :   Social Connections: Unknown  . Frequency of Communication with Friends and Family: Not on file  . Frequency of Social Gatherings with Friends and Family: Not on file  . Attends Religious Services: Never  . Active Member of Clubs or Organizations: No  . Attends Archivist Meetings: Never  . Marital Status: Never married    Allergies: No Known Allergies  Metabolic Disorder Labs: No results found for: HGBA1C, MPG No results found for: PROLACTIN Lab Results  Component Value Date   CHOL 228 (H) 05/14/2018   TRIG 167.0 (H) 05/14/2018   HDL 48.60 05/14/2018   CHOLHDL 5 05/14/2018   VLDL 33.4 05/14/2018   LDLCALC 146 (H) 05/14/2018   LDLCALC 161 (H) 04/11/2017   Lab Results  Component Value Date   TSH 1.448 03/04/2019   TSH 0.952 04/04/2012    Therapeutic Level Labs: No results found for: LITHIUM No results found for: VALPROATE No  components found for:  CBMZ  Current Medications: Current Outpatient Medications  Medication Sig Dispense Refill  . albuterol (PROAIR HFA) 108 (90 Base) MCG/ACT inhaler Inhale 2 puffs into the lungs every 6 (six) hours as needed for wheezing or shortness of breath. 6.7 g 3  . cetirizine (ZYRTEC) 10 MG tablet Take 10 mg by mouth daily.      . risperiDONE (RISPERDAL) 1 MG tablet Take 1 tablet (1 mg total) by mouth at bedtime. 90 tablet 0  . traZODone (DESYREL) 50 MG tablet Take 1 tablet (50 mg total) by mouth at bedtime as needed for sleep. 90 tablet 0  No current facility-administered medications for this visit.     Musculoskeletal: Strength & Muscle Tone: UTA Gait & Station: UTA Patient leans: N/A  Psychiatric Specialty Exam: Review of Systems  Musculoskeletal: Positive for back pain.  Psychiatric/Behavioral: Negative for agitation, behavioral problems, confusion, decreased concentration, dysphoric mood, hallucinations, self-injury, sleep disturbance and suicidal ideas. The patient is not nervous/anxious and is not hyperactive.   All other systems reviewed and are negative.   There were no vitals taken for this visit.There is no height or weight on file to calculate BMI.  General Appearance: UTA  Eye Contact:  UTA  Speech:  Normal Rate  Volume:  Normal  Mood:  Euthymic  Affect:  UTA  Thought Process:  Goal Directed and Descriptions of Associations: Intact  Orientation:  Full (Time, Place, and Person)  Thought Content: Logical   Suicidal Thoughts:  No  Homicidal Thoughts:  No  Memory:  Immediate;   Fair Recent;   Fair Remote;   Fair  Judgement:  Fair  Insight:  Fair  Psychomotor Activity:  UTA  Concentration:  Concentration: Fair and Attention Span: Fair  Recall:  [employer] of Knowledge: Fair  Language: Fair  Akathisia:  No  Handed:  Right  AIMS (if indicated): UTA  Assets:  Communication Skills Desire for Improvement Housing Social Support  ADL's:  Intact   Cognition: WNL  Sleep:  Fair   Screenings: AUDIT     Admission (Discharged) from 11/18/2019 in [facility]  Alcohol Use Disorder Identification Test Final Score (AUDIT) 0    PHQ2-9     Office Visit from 12/18/2019 in [facility] Office Visit from 05/17/2019 in [place] at [facility] Visit from 04/11/2017 in [place] at [facility] Visit from 04/06/2016 in [place] at [facility] Visit from 04/06/2015 in [place] at [facility] Total Score 0 0 0 0 0       Assessment and Plan: CELES DEDIC is a 69 year old African-American female, single, currently lives in [place], has a history of cognitive disorder, psychosis currently resolved, insomnia was evaluated by telemedicine today.  Patient is currently doing well on the current medication regimen.  Plan as noted below.  Plan Major neurocognitive disorder/unspecified-unstable she will continue to follow-up with neurology. Currently denies any memory problems. Appeared to be alert oriented to person place situation. Continue risperidone 1 mg p.o. nightly. We'll monitor her closely before tapering it off.  Psychosis-resolved We'll monitor closely. If she continues to do well we'll taper her off of risperidone  Insomnia-stable Trazodone 25 to 50 mg p.o. nightly as needed  Patient had neuropsychological testing done at [facility] neurology recently-she will continue to follow-up with neuropsychologist/neurologist for the same.  Will request medical records.  Follow-up in clinic in 3 months or sooner if needed.  I have spent atleast 20 minutes non face to face with patient today. More than 50 % of the time was spent for preparing to see the patient ( e.g., review of test, records ), ordering medications and test ,psychoeducation and supportive psychotherapy and care coordination,as well as documenting clinical information in  electronic health record. This note was generated in part or whole with voice recognition software. Voice recognition is usually quite accurate but there are transcription errors that can and very often do occur. I apologize for any typographical errors that were not detected and corrected.        Ursula Alert, MD 03/16/2020, 2:13 PM

## 2020-05-18 ENCOUNTER — Encounter: Payer: Medicare Other | Admitting: Internal Medicine

## 2020-05-27 ENCOUNTER — Ambulatory Visit (INDEPENDENT_AMBULATORY_CARE_PROVIDER_SITE_OTHER): Payer: Medicare Other | Admitting: Internal Medicine

## 2020-05-27 ENCOUNTER — Other Ambulatory Visit: Payer: Self-pay

## 2020-05-27 ENCOUNTER — Encounter: Payer: Self-pay | Admitting: Internal Medicine

## 2020-05-27 VITALS — BP 124/86 | HR 94 | Temp 97.7°F | Ht 62.5 in | Wt 202.0 lb

## 2020-05-27 DIAGNOSIS — J4520 Mild intermittent asthma, uncomplicated: Secondary | ICD-10-CM

## 2020-05-27 DIAGNOSIS — Z0000 Encounter for general adult medical examination without abnormal findings: Secondary | ICD-10-CM

## 2020-05-27 DIAGNOSIS — Z23 Encounter for immunization: Secondary | ICD-10-CM | POA: Diagnosis not present

## 2020-05-27 DIAGNOSIS — Z7189 Other specified counseling: Secondary | ICD-10-CM | POA: Diagnosis not present

## 2020-05-27 DIAGNOSIS — E785 Hyperlipidemia, unspecified: Secondary | ICD-10-CM | POA: Diagnosis not present

## 2020-05-27 DIAGNOSIS — F29 Unspecified psychosis not due to a substance or known physiological condition: Secondary | ICD-10-CM | POA: Diagnosis not present

## 2020-05-27 LAB — LIPID PANEL
Cholesterol: 262 mg/dL — ABNORMAL HIGH (ref 0–200)
HDL: 52.9 mg/dL (ref >39.00)
LDL Cholesterol: 173 mg/dL — ABNORMAL HIGH (ref 0–99)
NonHDL: 208.91
Total CHOL/HDL Ratio: 5
Triglycerides: 178 mg/dL — ABNORMAL HIGH (ref 0.0–149.0)
VLDL: 35.6 mg/dL (ref 0.0–40.0)

## 2020-05-27 LAB — COMPREHENSIVE METABOLIC PANEL
ALT: 9 U/L (ref 0–35)
AST: 16 U/L (ref 0–37)
Albumin: 4.2 g/dL (ref 3.5–5.2)
Alkaline Phosphatase: 49 U/L (ref 39–117)
BUN: 10 mg/dL (ref 6–23)
CO2: 32 mEq/L (ref 19–32)
Calcium: 9.4 mg/dL (ref 8.4–10.5)
Chloride: 102 mEq/L (ref 96–112)
Creatinine, Ser: 0.91 mg/dL (ref 0.40–1.20)
GFR: 63.3 mL/min (ref >60.00)
Glucose, Bld: 96 mg/dL (ref 70–99)
Potassium: 3.7 mEq/L (ref 3.5–5.1)
Sodium: 140 mEq/L (ref 135–145)
Total Bilirubin: 0.6 mg/dL (ref 0.2–1.2)
Total Protein: 7.1 g/dL (ref 6.0–8.3)

## 2020-05-27 LAB — CBC
HCT: 42.7 % (ref 36.0–46.0)
Hemoglobin: 14.3 g/dL (ref 12.0–15.0)
MCHC: 33.4 g/dL (ref 30.0–36.0)
MCV: 91.5 fl (ref 78.0–100.0)
Platelets: 265 10*3/uL (ref 150.0–400.0)
RBC: 4.66 Mil/uL (ref 3.87–5.11)
RDW: 13.7 % (ref 11.5–15.5)
WBC: 3.7 10*3/uL — ABNORMAL LOW (ref 4.0–10.5)

## 2020-05-27 NOTE — Assessment & Plan Note (Signed)
Unclear diagnosis Doing well now on the risperidone---urged her not to cut dose Doesn't seem to be Lewy body dementia or schizophrenia

## 2020-05-27 NOTE — Assessment & Plan Note (Signed)
I have personally reviewed the Medicare Annual Wellness questionnaire and have noted 1. The patient's medical and social history 2. Their use of alcohol, tobacco or illicit drugs 3. Their current medications and supplements 4. The patient's functional ability including ADL's, fall risks, home safety risks and hearing or visual             impairment. 5. Diet and physical activities 6. Evidence for depression or mood disorders  The patients weight, height, BMI and visual acuity have been recorded in the chart I have made referrals, counseling and provided education to the patient based review of the above and I have provided the pt with a written personalized care plan for preventive services.  I have provided you with a copy of your personalized plan for preventive services. Please take the time to review along with your updated medication list.  Will give flu vaccine today COVID booster Prefers no pneumonia vaccines Yearly mammogram in November Colon due 2024 Discussed exercise

## 2020-05-27 NOTE — Patient Instructions (Signed)
Please get your COVID booster----either [place] through the website, or [facility]

## 2020-05-27 NOTE — Addendum Note (Signed)
Addended by: Pilar Grammes on: 05/27/2020 04:44 PM   Modules accepted: Orders

## 2020-05-27 NOTE — Assessment & Plan Note (Signed)
See social history 

## 2020-05-27 NOTE — Progress Notes (Signed)
Hearing Screening   Method: Audiometry   125Hz 250Hz 500Hz 1000Hz 2000Hz 3000Hz 4000Hz 6000Hz 8000Hz  Right ear:   20 20 20  20    Left ear:   20 20 20  20    Vision Screening Comments: September 2021   

## 2020-05-27 NOTE — Assessment & Plan Note (Signed)
Mildly elevated Will recheck on antipsychotic

## 2020-05-27 NOTE — Assessment & Plan Note (Signed)
Controlled now Rarely uses the albuterol inhaler

## 2020-05-27 NOTE — Progress Notes (Signed)
Subjective:    Patient ID: Natalie [name], female    DOB: 16-May-1951, 69 y.o.   MRN: [id number]  HPI Here for Medicare wellness visit and follow up of chronic health conditions This visit occurred during the SARS-CoV-2 public health emergency.  Safety protocols were in place, including screening questions prior to the visit, additional usage of staff PPE, and extensive cleaning of exam room while observing appropriate contact time as indicated for disinfecting solutions.   Reviewed advanced directives Reviewed other doctors---[facility], Dr [name], Dr Eappen--psychiatrist, Dr Aquino--neurologist Did have one hospitalization in spring due to psychosis Walks a little occasionally Vision is fine Hearing is okay No falls No depression or anhedonia  Back in her own apartment Drives Does all shopping, bills, instrumental ADLs Memory seems stable No apparent psychosis recently---continues the risperdal every night  Asthma is quiet Rarely uses inhaler No regular cough No SOB No chest pain  Current Outpatient Medications on File Prior to Visit  Medication Sig Dispense Refill  . albuterol (PROAIR HFA) 108 (90 Base) MCG/ACT inhaler Inhale 2 puffs into the lungs every 6 (six) hours as needed for wheezing or shortness of breath. 6.7 g 3  . cetirizine (ZYRTEC) 10 MG tablet Take 10 mg by mouth daily.      . risperiDONE (RISPERDAL) 1 MG tablet Take 1 tablet (1 mg total) by mouth at bedtime. 90 tablet 0  . traZODone (DESYREL) 50 MG tablet Take 1 tablet (50 mg total) by mouth at bedtime as needed for sleep. 90 tablet 0   No current facility-administered medications on file prior to visit.    No Known Allergies  Past Medical History:  Diagnosis Date  . Allergy   . Arthritis   . Asthma   . Cataract   . GERD (gastroesophageal reflux disease)    pt. denied  . Heart murmur    was told years ago  . History of shingles 2006  . Hyperlipidemia     Past Surgical  History:  Procedure Laterality Date  . ABDOMINAL HYSTERECTOMY    . COLONOSCOPY    . POLYPECTOMY    . VAGINAL DELIVERY     x2    Family History  Problem Relation Age of Onset  . Arthritis Mother   . Diabetes Mother   . Hypertension Mother   . Liver cancer Mother   . Colon cancer Father 79  . Pancreatic cancer Brother   . Colon polyps Brother   . Breast cancer Daughter 74  . Alcohol abuse Other   . Heart disease Neg Hx   . Esophageal cancer Neg Hx   . Rectal cancer Neg Hx   . Stomach cancer Neg Hx     Social History   Socioeconomic History  . Marital status: Single    Spouse name: Not on file  . Number of children: 2  . Years of education: Not on file  . Highest education level: Not on file  Occupational History  . 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 Smoking status: Never Smoker  . Smokeless tobacco: Never Used  Vaping Use  . Vaping Use: Never used  Substance and Sexual Activity  . Alcohol use: No  . Drug use: No  . Sexual activity: Not Currently  Other Topics Concern  . Not on file  Social History Narrative   No living will   Requests daughter [name] as health care POA   Would accept resuscitation  but no prolonged machines   Not sure about tube feeds      Right handed      Completed HS   Social Determinants of Health   Financial Resource Strain:   . Difficulty of Paying Living Expenses: Not on file  Food Insecurity:   . Worried About Charity fundraiser in the Last Year: Not on file  . Ran Out of Food in the Last Year: Not on file  Transportation Needs:   . Lack of Transportation (Medical): Not on file  . Lack of Transportation (Non-Medical): Not on file  Physical Activity:   . Days of Exercise per Week: Not on file  . Minutes of Exercise per Session: Not on file  Stress:   . Feeling of Stress : Not on file  Social Connections:   . Frequency of Communication with Friends and Family:  Not on file  . Frequency of Social Gatherings with Friends and Family: Not on file  . Attends Religious Services: Not on file  . Active Member of Clubs or Organizations: Not on file  . Attends Archivist Meetings: Not on file  . Marital Status: Not on file  Intimate Partner Violence:   . Fear of Current or Ex-Partner: Not on file  . Emotionally Abused: Not on file  . Physically Abused: Not on file  . Sexually Abused: Not on file   Review of Systems Appetite is good--weight is up Sleeps okay with risperdal and trazodone Various skin tags around eyes--and benign mass on anterior neck that bothers her Wears seat belt Teeth okay No heartburn or dysphagia Bowels are variable--some constipation. No blood No dysuria or hematuria. Some urgency but no incontinence No sig back or joint pains---some left arm pain     Objective:   Physical Exam Constitutional:      Appearance: Normal appearance.  HENT:     Mouth/Throat:     Comments: No lesions Eyes:     Conjunctiva/sclera: Conjunctivae normal.     Pupils: Pupils are equal, round, and reactive to light.  Cardiovascular:     Rate and Rhythm: Normal rate and regular rhythm.     Pulses: Normal pulses.     Heart sounds: No murmur heard.  No gallop.   Pulmonary:     Effort: Pulmonary effort is normal.     Breath sounds: Normal breath sounds. No wheezing or rales.  Abdominal:     Palpations: Abdomen is soft.     Tenderness: There is no abdominal tenderness.  Musculoskeletal:     Right lower leg: No edema.     Left lower leg: No edema.  Skin:    Comments: Benign pedunculated lesion on neck  Neurological:     Mental Status: She is alert and oriented to person, place, and time.     Comments: President--"Biden, Trump, Obama" [id number] Recall 3/3  Psychiatric:        Mood and Affect: Mood normal.        Behavior: Behavior normal.            Assessment & Plan:

## 2020-06-01 ENCOUNTER — Other Ambulatory Visit: Payer: Self-pay | Admitting: Internal Medicine

## 2020-06-01 DIAGNOSIS — Z1231 Encounter for screening mammogram for malignant neoplasm of breast: Secondary | ICD-10-CM

## 2020-06-24 ENCOUNTER — Encounter: Payer: Self-pay | Admitting: Psychiatry

## 2020-06-24 ENCOUNTER — Telehealth (INDEPENDENT_AMBULATORY_CARE_PROVIDER_SITE_OTHER): Payer: Medicare Other | Admitting: Psychiatry

## 2020-06-24 ENCOUNTER — Other Ambulatory Visit: Payer: Self-pay

## 2020-06-24 DIAGNOSIS — G3184 Mild cognitive impairment, so stated: Secondary | ICD-10-CM

## 2020-06-24 DIAGNOSIS — Z8659 Personal history of other mental and behavioral disorders: Secondary | ICD-10-CM

## 2020-06-24 DIAGNOSIS — G4701 Insomnia due to medical condition: Secondary | ICD-10-CM

## 2020-06-24 DIAGNOSIS — F0670 Mild neurocognitive disorder due to known physiological condition without behavioral disturbance: Secondary | ICD-10-CM

## 2020-06-24 MED ORDER — TRAZODONE HCL 50 MG PO TABS: 50.0000 mg | ORAL_TABLET | Freq: Every evening | ORAL | 1 refills | Status: DC | PRN

## 2020-06-24 MED ORDER — RISPERIDONE 1 MG PO TABS: 1.0000 mg | ORAL_TABLET | Freq: Every day | ORAL | 1 refills | Status: DC

## 2020-06-24 NOTE — Progress Notes (Signed)
Virtual Visit via Telephone Note  I connected with Natalie [name] on 06/24/20 at  2:00 PM EST by telephone and verified that I am speaking with the correct person using two identifiers.  Location Provider Location : [facility] Patient Location : Home  Participants: Patient , Provider   I discussed the limitations, risks, security and privacy concerns of performing an evaluation and management service by telephone and the availability of in person appointments. I also discussed with the patient that there may be a patient responsible charge related to this service. The patient expressed understanding and agreed to proceed.  I discussed the assessment and treatment plan with the patient. The patient was provided an opportunity to ask questions and all were answered. The patient agreed with the plan and demonstrated an understanding of the instructions.  The patient was advised to call back or seek an in-person evaluation if the symptoms worsen or if the condition fails to improve as anticipated.   Natalie Lacs MD OP Progress Note  06/24/2020 2:17 PM Natalie [name]  MRN:  [id number]  Chief Complaint:  Chief Complaint    Follow-up     HPI: Natalie [name] is a 69 year old African-American female, retired, has a history of mild neurocognitive disorder, insomnia, history of psychosis, asthma, hypokalemia was evaluated by phone today.  Patient today reports she is currently living by herself.  She does have support from her daughter.  She is doing well.  Denies any significant memory changes.  She is able to take care of her finances, take care of herself and cook for herself without any problems.  She reports she is compliant on medications.  Denies side effects.  She reports she has upcoming appointment with neurology coming up next week.  She did not seem to be preoccupied with delusions or paranoia.  She denies any perceptual disturbances.  Patient denies any suicidal thoughts or  homicidality.  Patient denies any other concerns today.  Visit Diagnosis:    ICD-10-CM   1. Mild neurocognitive disorder due to multiple etiologies  G31.84   2. Insomnia due to medical condition  G47.01 traZODone (DESYREL) 50 MG tablet    risperiDONE (RISPERDAL) 1 MG tablet  3. History of psychosis  Z86.59     Past Psychiatric History: I have reviewed past psychiatric history from my progress note on 03/25/2019.  Past trials of Invega Sustenna, risperidone  Past Medical History:  Past Medical History:  Diagnosis Date   Allergy    Arthritis    Asthma    Cataract    GERD (gastroesophageal reflux disease)    pt. denied   Heart murmur    was told years ago   History of shingles 2006   Hyperlipidemia     Past Surgical History:  Procedure Laterality Date   ABDOMINAL HYSTERECTOMY     COLONOSCOPY     POLYPECTOMY     VAGINAL DELIVERY     x2    Family Psychiatric History: Reviewed family psychiatric history from my progress note on 03/25/2019  Family History:  Family History  Problem Relation Age of Onset   Arthritis Mother    Diabetes Mother    Hypertension Mother    Liver cancer Mother    Colon cancer Father 32   Pancreatic cancer Brother    Colon polyps Brother    Breast cancer Daughter 22   Alcohol abuse Other    Heart disease Neg Hx    Esophageal cancer Neg Hx    Rectal cancer  Neg Hx    Stomach cancer Neg Hx     Social History: Reviewed social history from my progress note on 03/25/2019 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  Tobacco Use   Smoking status: Never Smoker   Smokeless tobacco: Never Used  Scientific laboratory technician Use: Never used  Substance and Sexual Activity    Alcohol use: No   Drug use: No   Sexual activity: Not Currently  Other Topics Concern   Not on file  Social History Narrative   No living will   Requests daughter [name] as health care POA   Would accept resuscitation but no prolonged machines   Not sure about tube feeds--but probably doesn't want      Right handed      Completed HS   Social Determinants of Health   Financial Resource Strain:    Difficulty of Paying Living Expenses: Not on file  Food Insecurity:    Worried About Charity fundraiser in the Last Year: Not on file   [place] in the Last Year: Not on file  Transportation Needs:    Lack of Transportation (Medical): Not on file   Lack of Transportation (Non-Medical): Not on file  Physical Activity:    Days of Exercise per Week: Not on file   Minutes of Exercise per Session: Not on file  Stress:    Feeling of Stress : Not on file  Social Connections:    Frequency of Communication with Friends and Family: Not on file   Frequency of Social Gatherings with Friends and Family: Not on file   Attends Religious Services: Not on file   Active Member of Clubs or Organizations: Not on file   Attends Archivist Meetings: Not on file   Marital Status: Not on file    Allergies: No Known Allergies  Metabolic Disorder Labs: No results found for: HGBA1C, MPG No results found for: PROLACTIN Lab Results  Component Value Date   CHOL 262 (H) 05/27/2020   TRIG 178.0 (H) 05/27/2020   HDL 52.90 05/27/2020   CHOLHDL 5 05/27/2020   VLDL 35.6 05/27/2020   LDLCALC 173 (H) 05/27/2020   LDLCALC 146 (H) 05/14/2018   Lab Results  Component Value Date   TSH 1.448 03/04/2019   TSH 0.952 04/04/2012    Therapeutic Level Labs: No results found for: LITHIUM No results found for: VALPROATE No components found for:  CBMZ  Current Medications: Current Outpatient Medications  Medication Sig Dispense Refill   albuterol (PROAIR HFA) 108 (90 Base)  MCG/ACT inhaler Inhale 2 puffs into the lungs every 6 (six) hours as needed for wheezing or shortness of breath. 6.7 g 3   cetirizine (ZYRTEC) 10 MG tablet Take 10 mg by mouth daily.       risperiDONE (RISPERDAL) 1 MG tablet Take 1 tablet (1 mg total) by mouth at bedtime. 90 tablet 1   traZODone (DESYREL) 50 MG tablet Take 1 tablet (50 mg total) by mouth at bedtime as needed for sleep. 90 tablet 1   No current facility-administered medications for this visit.     Musculoskeletal: Strength & Muscle Tone: UTA Gait & Station: UTA Patient  leans: N/A  Psychiatric Specialty Exam: Review of Systems  Psychiatric/Behavioral: Negative for agitation, behavioral problems, confusion, decreased concentration, dysphoric mood, hallucinations, self-injury, sleep disturbance and suicidal ideas. The patient is not nervous/anxious and is not hyperactive.   All other systems reviewed and are negative.   There were no vitals taken for this visit.There is no height or weight on file to calculate BMI.  General Appearance: UTA  Eye Contact:  UTA  Speech:  Clear and Coherent  Volume:  Normal  Mood:  Euthymic  Affect:  UTA  Thought Process:  Goal Directed and Descriptions of Associations: Intact  Orientation:  Full (Time, Place, and Person)  Thought Content: Logical   Suicidal Thoughts:  No  Homicidal Thoughts:  No  Memory:  Immediate;   Fair Recent;   Fair Remote;   Fair  Judgement:  Fair  Insight:  Fair  Psychomotor Activity:  UTA  Concentration:  Concentration: Fair and Attention Span: Fair  Recall:  [employer] of Knowledge: Fair  Language: Fair  Akathisia:  No  Handed:  Right  AIMS (if indicated): UTA  Assets:  Communication Skills Desire for Improvement Social Support  ADL's:  Intact  Cognition: WNL  Sleep:  Fair   Screenings: AUDIT     Admission (Discharged) from 11/18/2019 in [place]  Alcohol Use Disorder Identification Test Final Score (AUDIT) 0     PHQ2-9     Office Visit from 05/27/2020 in [place] at [facility] Visit from 12/18/2019 in [facility] from 05/17/2019 in [place] at [facility] Visit from 04/11/2017 in [place] at [facility] Visit from 04/06/2016 in [place] at [facility] Total Score 0 0 0 0 0       Assessment and Plan: Natalie [name] is a 69 year old African-American female, single, currently lives in [place], has a history of cognitive disorder, psychosis, was evaluated by phone today.  Patient is currently stable on current medication regimen.  Plan as noted below.  Plan Neurocognitive disorder mild-stable She will continue to follow-up with neurology. Continue risperidone 1 mg p.o. nightly for her history of psychosis.  We will monitor her closely and will consider tapering it off gradually if she continues to remain stable.  Psychosis-resolved We will monitor closely.  Continue risperidone at this time.  Insomnia-stable Trazodone 25 to 50 mg p.o. nightly as needed  Follow-up in clinic in 3 to 4 months or sooner if needed.  I have spent atleast 19 minutes non face to face with patient today. More than 50 % of the time was spent for preparing to see the patient ( e.g., review of test, records ),ordering medications and test ,psychoeducation and supportive psychotherapy and care coordination,as well as documenting clinical information in electronic health record. This note was generated in part or whole with voice recognition software. Voice recognition is usually quite accurate but there are transcription errors that can and very often do occur. I apologize for any typographical errors that were not detected and corrected.        Ursula Alert, MD 06/24/2020, 2:17 PM

## 2020-06-29 ENCOUNTER — Encounter: Payer: Self-pay | Admitting: Neurology

## 2020-06-29 ENCOUNTER — Ambulatory Visit: Payer: Medicare Other | Admitting: Neurology

## 2020-06-29 ENCOUNTER — Other Ambulatory Visit: Payer: Self-pay

## 2020-06-29 VITALS — BP 127/79 | HR 97 | Ht 63.0 in | Wt 203.8 lb

## 2020-06-29 DIAGNOSIS — F0670 Mild neurocognitive disorder due to known physiological condition without behavioral disturbance: Secondary | ICD-10-CM

## 2020-06-29 DIAGNOSIS — G3184 Mild cognitive impairment, so stated: Secondary | ICD-10-CM

## 2020-06-29 DIAGNOSIS — F200 Paranoid schizophrenia: Secondary | ICD-10-CM | POA: Diagnosis not present
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
## 2020-06-29 NOTE — Patient Instructions (Signed)
Good to see you! Continue follow-up with Psychiatry and PCP. If any concerns regarding memory worsening arise, repeat Neurocognitive testing can be done. Follow-up as needed, call for any changes.   RECOMMENDATIONS FOR ALL PATIENTS WITH MEMORY PROBLEMS: 1. Continue to exercise (Recommend 30 minutes of walking everyday, or 3 hours every week) 2. Increase social interactions - continue going to [place] and enjoy social gatherings with friends and family 3. Eat healthy, avoid fried foods and eat more fruits and vegetables 4. Maintain adequate blood pressure, blood sugar, and blood cholesterol level. Reducing the risk of stroke and cardiovascular disease also helps promoting better memory. 5. Avoid stressful situations. Live a simple life and avoid aggravations. Organize your time and prepare for the next day in anticipation. 6. Sleep well, avoid any interruptions of sleep and avoid any distractions in the bedroom that may interfere with adequate sleep quality 7. Avoid sugar, avoid sweets as there is a strong link between excessive sugar intake, diabetes, and cognitive impairment The Mediterranean diet has been shown to help patients reduce the risk of progressive memory disorders and reduces cardiovascular risk. This includes eating fish, eat fruits and green leafy vegetables, nuts like almonds and hazelnuts, walnuts, and also use olive oil. Avoid fast foods and fried foods as much as possible. Avoid sweets and sugar as sugar use has been linked to worsening of memory function.

## 2020-07-08 ENCOUNTER — Other Ambulatory Visit: Payer: Self-pay

## 2020-07-08 ENCOUNTER — Ambulatory Visit
Admission: RE | Admit: 2020-07-08 | Discharge: 2020-07-08 | Disposition: A | Discharge status: Home / Self Care | Payer: Medicare Other | Source: Ambulatory Visit | Attending: Internal Medicine | Admitting: Internal Medicine

## 2020-07-08 DIAGNOSIS — Z1231 Encounter for screening mammogram for malignant neoplasm of breast: Secondary | ICD-10-CM | POA: Insufficient documentation

## 2020-10-13 ENCOUNTER — Ambulatory Visit: Payer: Medicare Other | Admitting: Psychiatry

## 2020-10-13 ENCOUNTER — Encounter: Payer: Self-pay | Admitting: Psychiatry

## 2020-10-13 ENCOUNTER — Telehealth: Payer: Self-pay | Admitting: *Deleted

## 2020-10-13 ENCOUNTER — Other Ambulatory Visit: Payer: Self-pay

## 2020-10-13 VITALS — BP 138/92 | HR 104 | Temp 97.1°F

## 2020-10-13 DIAGNOSIS — Z79899 Other long term (current) drug therapy: Secondary | ICD-10-CM

## 2020-10-13 DIAGNOSIS — Z8659 Personal history of other mental and behavioral disorders: Secondary | ICD-10-CM

## 2020-10-13 DIAGNOSIS — G3184 Mild cognitive impairment, so stated: Secondary | ICD-10-CM | POA: Diagnosis not present

## 2020-10-13 DIAGNOSIS — G4701 Insomnia due to medical condition: Secondary | ICD-10-CM

## 2020-10-13 DIAGNOSIS — F0670 Mild neurocognitive disorder due to known physiological condition without behavioral disturbance: Secondary | ICD-10-CM

## 2020-10-13 NOTE — Progress Notes (Signed)
[facility] MD OP Progress Note  10/13/2020 10:35 AM Natalie [name]  MRN:  [id number]  Chief Complaint:  Chief Complaint    Follow-up     HPI: Natalie [name] is a 70 year old African-American female, lives in [place], retired, has a history of psychosis likely brief psychotic episode, mild neurocognitive disorder, asthma, hypokalemia was evaluated in office today.  Patient today reports she is overall doing well.  Denies any depression or anxiety symptoms.  She reports sleep is overall okay.  Some nights can be restless however overall she is doing well with the trazodone.  She wakes up feeling rested most days.  Patient reports she enjoys doing puzzles, coloring as well as watching her game shows and soap operas.  Patient denies any problems with her attention or focus.  She reports she drives without any difficulty, does grocery shopping, takes care of her house by herself, pays her bills.    Patient today appeared to be alert, oriented to person place time and situation.  An MMSE was completed and she scored 30 out of 30.  She was able to answer all questions appropriately.  Patient did not appear to have any side effects to medications, no tremors or restlessness were noted in session.  Patient also did not appear to be paranoid and denied any perceptual disturbances.  I have reviewed notes per neurology-Dr. Delice Lesch -dated 06/29/2020-87 year old woman with history of diet-controlled hyperlipidemia, mild cognitive impairment, paranoid delusions started in her mid 36s.  Neuropsychological evaluation June 2021 indicated mild cognitive impairment.  She mostly had difficulty with executive function measures.  Etiology unclear, cognitive deficits are associated with aging and schizophrenia and major mental illness which could be explanatory in her condition.  Neuropsych testing recommendations include that if symptoms are due to neurodegeneration this will become clearer over time and repeat  neuropsychological evaluation can be done by her PCP in 1 to 2 years if changes are noted.'  Visit Diagnosis:    ICD-10-CM   1. Mild neurocognitive disorder due to multiple etiologies  G31.84   2. Insomnia due to medical condition  G47.01   3. History of psychosis  Z86.59    Brief psychotic episode - currently resolved  4. High risk medication use  Z79.899 Prolactin    Hemoglobin A1C    TSH    Past Psychiatric History: I have reviewed past psychiatric history from my progress note on 03/25/2019.  Past trials of Invega Sustenna, risperidone  Past Medical History:  Past Medical History:  Diagnosis Date  . Allergy   . Arthritis   . Asthma   . Cataract   . GERD (gastroesophageal reflux disease)    pt. denied  . Heart murmur    was told years ago  . History of shingles 2006  . Hyperlipidemia     Past Surgical History:  Procedure Laterality Date  . ABDOMINAL HYSTERECTOMY    . COLONOSCOPY    . POLYPECTOMY    . VAGINAL DELIVERY     x2    Family Psychiatric History: I have reviewed family psychiatric history from my progress note on 03/25/2019  Family History:  Family History  Problem Relation Age of Onset  . Arthritis Mother   . Diabetes Mother   . Hypertension Mother   . Liver cancer Mother   . Colon cancer Father 65  . Pancreatic cancer Brother   . Colon polyps Brother   . Breast cancer Daughter 42  . Alcohol abuse Other   . Heart disease  Neg Hx   . Esophageal cancer Neg Hx   . Rectal cancer Neg Hx   . Stomach cancer Neg Hx     Social History: Reviewed social history from my progress note on 03/25/2019 Social History   Socioeconomic History  . Marital status: Single    Spouse name: Not on file  . Number of children: 2  . Years of education: Not on file  . Highest education level: Not on file  Occupational History  . 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 Smoking status: Never Smoker  . Smokeless  tobacco: Never Used  Vaping Use  . Vaping Use: Never used  Substance and Sexual Activity  . Alcohol use: No  . Drug use: No  . Sexual activity: Not Currently  Other Topics Concern  . Not on file  Social History Narrative   No living will   Requests daughter [name] as health care POA   Would accept resuscitation but no prolonged machines   Not sure about tube feeds--but probably doesn't want      Right handed      Completed HS   Social Determinants of Health   Financial Resource Strain: Not on file  Food Insecurity: Not on file  Transportation Needs: Not on file  Physical Activity: Not on file  Stress: Not on file  Social Connections: Not on file    Allergies: No Known Allergies  Metabolic Disorder Labs: No results found for: HGBA1C, MPG No results found for: PROLACTIN Lab Results  Component Value Date   CHOL 262 (H) 05/27/2020   TRIG 178.0 (H) 05/27/2020   HDL 52.90 05/27/2020   CHOLHDL 5 05/27/2020   VLDL 35.6 05/27/2020   LDLCALC 173 (H) 05/27/2020   LDLCALC 146 (H) 05/14/2018   Lab Results  Component Value Date   TSH 1.448 03/04/2019   TSH 0.952 04/04/2012    Therapeutic Level Labs: No results found for: LITHIUM No results found for: VALPROATE No components found for:  CBMZ  Current Medications: Current Outpatient Medications  Medication Sig Dispense Refill  . albuterol (PROAIR HFA) 108 (90 Base) MCG/ACT inhaler Inhale 2 puffs into the lungs every 6 (six) hours as needed for wheezing or shortness of breath. 6.7 g 3  . cetirizine (ZYRTEC) 10 MG tablet Take 10 mg by mouth daily.    Marland Kitchen ibuprofen (ADVIL) 200 MG tablet Take 200 mg by mouth every 6 (six) hours as needed.    . risperiDONE (RISPERDAL) 1 MG tablet Take 1 tablet (1 mg total) by mouth at bedtime. 90 tablet 1  . traZODone (DESYREL) 50 MG tablet Take 1 tablet (50 mg total) by mouth at bedtime as needed for sleep. 90 tablet 1   No current facility-administered medications for this visit.      Musculoskeletal: Strength & Muscle Tone: UTA Gait & Station: normal Patient leans: N/A  Psychiatric Specialty Exam: Review of Systems  Psychiatric/Behavioral: Negative for agitation, behavioral problems, confusion, decreased concentration, dysphoric mood, hallucinations, self-injury, sleep disturbance and suicidal ideas. The patient is not nervous/anxious and is not hyperactive.   All other systems reviewed and are negative.   Blood pressure (!) 138/92, pulse (!) 104, temperature (!) 97.1 F (36.2 C), temperature source Tympanic.There is no height or weight on file to calculate BMI.  General Appearance: Casual  Eye Contact:  Fair  Speech:  Normal Rate  Volume:  Normal  Mood:  Euthymic  Affect:  Congruent  Thought  Process:  Goal Directed and Descriptions of Associations: Intact  Orientation:  Full (Time, Place, and Person)  Thought Content: Logical   Suicidal Thoughts:  No  Homicidal Thoughts:  No  Memory:  Immediate;   Fair Recent;   Fair Remote;   Fair  Judgement:  Fair  Insight:  Fair  Psychomotor Activity:  Normal  Concentration:  Concentration: Fair and Attention Span: Fair  Recall:  [employer] of Knowledge: Fair  Language: Fair  Akathisia:  No  Handed:  Right  AIMS (if indicated): done  Assets:  Communication Skills Desire for Improvement Housing Social Support  ADL's:  Intact  Cognition: WNL  Sleep:  Fair   Screenings: AUDIT   Flowsheet Row Admission (Discharged) from 11/18/2019 in [facility]  Alcohol Use Disorder Identification Test Final Score (AUDIT) 0    PHQ2-9   [facility] Office Visit from 05/27/2020 in [facility] Visit from 12/18/2019 in [facility] from 05/17/2019 in [facility] at [facility] from 04/11/2017 in [facility] from 04/06/2016 in [facility] at [facility]  PHQ-2 Total Score 0 0 0 0 0     [place] Admission (Discharged) from 11/18/2019 in [facility] ED from 11/17/2019 in [facility] CATEGORY No Risk No Risk       Assessment and Plan: Natalie [name] is a 70 year old African-American female, single, currently lives in [place], has a history of cognitive disorder, history of psychosis likely brief psychotic episode currently resolved, insomnia was evaluated in office today.  Patient is currently stable on current medication regimen.  Plan as noted below.  Plan Cognitive disorder/unspecified-unstable Patient will continue to follow-up with neurology. As noted above I have reviewed notes per neurologist-Dr.Aquino dated 06/29/2020-patient with mild cognitive impairment currently doing well.  Patient will benefit from neuropsychological testing in 1 to 2 years if she has any decompensation. Continue risperidone 1 mg p.o. nightly for history of psychosis. Discussed tapering off risperidone and may be reducing the dosage today to half tablet.  Patient however reports she is not ready to do that yet.  Could reevaluate in future sessions. MMSE today 30/30  Brief psychotic episode-resolved We will monitor closely. Continue risperidone as prescribed Long-term plan is to taper off risperidone.  Insomnia-stable Trazodone 25-50 mg p.o. nightly as needed  High risk medication use-will order the following labs-hemoglobin A1c, TSH, prolactin level.  Patient also had abnormal lipid panel in October 2021-she could get it repeated at her primary care office.   Blood pressure today slightly elevated, patient to follow-up with primary care provider for the same.   Follow-up in clinic in 3 months in office.  I have spent atleast 30 minutes face to face with patient today which includes the time spent for preparing to see the patient ( e.g., review of test, records ), obtaining and to review and separately  obtained history , ordering medications and test ,psychoeducation and supportive psychotherapy and care coordination,as well as documenting clinical information in electronic health record.   Ursula Alert, MD 10/13/2020, 10:35 AM

## 2020-10-13 NOTE — Telephone Encounter (Signed)
Patient called stating that she saw Dr. Thomasena Edis today and she could not find where patient had a A1C done. Patent stated that Dr. Thomasena Edis told her if she has not had her A1C checked she needed to have it done. Patient wants to know if this needs to have this done. Patient is aware that Dr. Silvio Pate is out of the office this week.

## 2020-10-13 NOTE — Telephone Encounter (Signed)
Spoke to pt to get more information. Her psychiatrist, Dr [name], put in orders for her to have Prolactin Levels, A1C, and TSH under the diagnosis of high risk medications. She is not diabetic. She is wanting Dr Silvio Pate to order them so she can have them done here. Pt is aware it will be next week before she gets a response.

## 2020-10-14 ENCOUNTER — Other Ambulatory Visit: Payer: Self-pay | Admitting: Internal Medicine

## 2020-10-14 DIAGNOSIS — F29 Unspecified psychosis not due to a substance or known physiological condition: Secondary | ICD-10-CM

## 2020-10-14 NOTE — Telephone Encounter (Signed)
You can set her up for the lab work--but she needs to get the results to Dr [name]

## 2020-10-15 NOTE — Telephone Encounter (Signed)
Left message on VM per DPR that she can call and schedule a lab appt. Orders are in.

## 2020-10-23 ENCOUNTER — Other Ambulatory Visit (INDEPENDENT_AMBULATORY_CARE_PROVIDER_SITE_OTHER): Payer: Medicare Other

## 2020-10-23 ENCOUNTER — Other Ambulatory Visit: Payer: Self-pay

## 2020-10-23 DIAGNOSIS — F29 Unspecified psychosis not due to a substance or known physiological condition: Secondary | ICD-10-CM

## 2020-10-23 LAB — TSH: TSH: 1.35 u[IU]/mL (ref 0.35–4.50)

## 2020-10-23 LAB — HEMOGLOBIN A1C: Hgb A1c MFr Bld: 6 % (ref 4.6–6.5)

## 2020-10-24 LAB — PROLACTIN: Prolactin: 21.2 ng/mL

## 2020-12-21 ENCOUNTER — Telehealth: Payer: Self-pay

## 2020-12-21 DIAGNOSIS — G4701 Insomnia due to medical condition: Secondary | ICD-10-CM

## 2020-12-21 MED ORDER — TRAZODONE HCL 50 MG PO TABS
50.0000 mg | ORAL_TABLET | Freq: Every evening | ORAL | 1 refills | Status: DC | PRN
Start: 2020-12-21 — End: 2021-05-11

## 2020-12-21 MED ORDER — RISPERIDONE 1 MG PO TABS
1.0000 mg | ORAL_TABLET | Freq: Every day | ORAL | 1 refills | Status: DC
Start: 2020-12-21 — End: 2021-05-11

## 2020-12-21 NOTE — Telephone Encounter (Signed)
Rx sent 

## 2020-12-21 NOTE — Telephone Encounter (Signed)
pt called states she needs refill on the risperidone and on the trazodone.

## 2021-01-12 ENCOUNTER — Telehealth (INDEPENDENT_AMBULATORY_CARE_PROVIDER_SITE_OTHER): Payer: Medicare Other | Admitting: Psychiatry

## 2021-01-12 ENCOUNTER — Other Ambulatory Visit: Payer: Self-pay

## 2021-01-12 ENCOUNTER — Encounter: Payer: Self-pay | Admitting: Psychiatry

## 2021-01-12 DIAGNOSIS — Z79899 Other long term (current) drug therapy: Secondary | ICD-10-CM

## 2021-01-12 DIAGNOSIS — G4701 Insomnia due to medical condition: Secondary | ICD-10-CM

## 2021-01-12 DIAGNOSIS — G3184 Mild cognitive impairment, so stated: Secondary | ICD-10-CM

## 2021-01-12 DIAGNOSIS — Z8659 Personal history of other mental and behavioral disorders: Secondary | ICD-10-CM | POA: Diagnosis not present

## 2021-01-12 DIAGNOSIS — F0670 Mild neurocognitive disorder due to known physiological condition without behavioral disturbance: Secondary | ICD-10-CM

## 2021-01-12 NOTE — Progress Notes (Signed)
Virtual Visit via Telephone Note  I connected with Natalie [name] on 01/12/21 at 10:00 AM EDT by telephone and verified that I am speaking with the correct person using two identifiers.  Location Provider Location : Office Patient Location : Home  Participants: Patient , Provider   I discussed the limitations, risks, security and privacy concerns of performing an evaluation and management service by telephone and the availability of in person appointments. I also discussed with the patient that there may be a patient responsible charge related to this service. The patient expressed understanding and agreed to proceed.   I discussed the assessment and treatment plan with the patient. The patient was provided an opportunity to ask questions and all were answered. The patient agreed with the plan and demonstrated an understanding of the instructions.   The patient was advised to call back or seek an in-person evaluation if the symptoms worsen or if the condition fails to improve as anticipated.   Natalie [name] OP Progress Note  01/12/2021 10:21 AM Natalie [name]  MRN:  [id number]  Chief Complaint:  Chief Complaint    Follow-up; Depression     HPI: Natalie [name] is a 70 year old African-American female, lives in [place], retired, has a history of psychosis likely brief psychotic episode, mild neurocognitive disorder, asthma, hypokalemia was evaluated by telemedicine today.  Patient today reports she is currently doing well.  Denies any mood swings, depression or anxiety.  She reports she is taking her medications as prescribed.  She is sleeping okay.  She did not appear to be preoccupied with any paranoia or delusions.  Denied any other perceptual disturbances.  She appeared to be alert, oriented to person place time and situation and was able to answer questions appropriately.  Patient denies any other concerns today.  Visit Diagnosis:    ICD-10-CM   1. Mild  neurocognitive disorder due to multiple etiologies  G31.84   2. Insomnia due to medical condition  G47.01    mood   3. History of psychosis  Z86.59    Brief psychotic disorder - resolved  4. High risk medication use  Z79.899     Past Psychiatric History: I have reviewed past psychiatric history from progress note on 03/25/2019.  Past trials of Invega Sustenna, risperidone  Past Medical History:  Past Medical History:  Diagnosis Date  . Allergy   . Arthritis   . Asthma   . Cataract   . GERD (gastroesophageal reflux disease)    pt. denied  . Heart murmur    was told years ago  . History of shingles 2006  . Hyperlipidemia     Past Surgical History:  Procedure Laterality Date  . ABDOMINAL HYSTERECTOMY    . COLONOSCOPY    . POLYPECTOMY    . VAGINAL DELIVERY     x2    Family Psychiatric History: I have reviewed family psychiatric history from progress note on 03/25/2019  Family History:  Family History  Problem Relation Age of Onset  . Arthritis Mother   . Diabetes Mother   . Hypertension Mother   . Liver cancer Mother   . Colon cancer Father 16  . Pancreatic cancer Brother   . Colon polyps Brother   . Breast cancer Daughter 49  . Alcohol abuse Other   . Heart disease Neg Hx   . Esophageal cancer Neg Hx   . Rectal cancer Neg Hx   . Stomach cancer Neg Hx     Social History: Reviewed  social history from progress note on 03/25/2019 Social History   Socioeconomic History  . Marital status: Single    Spouse name: Not on file  . Number of children: 2  . Years of education: Not on file  . Highest education level: Not on file  Occupational History  . 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 Smoking status: Never Smoker  . Smokeless tobacco: Never Used  Vaping Use  . Vaping Use: Never used  Substance and Sexual Activity  . Alcohol use: No  . Drug use: No  . Sexual activity: Not Currently  Other Topics  Concern  . Not on file  Social History Narrative   No living will   Requests daughter [name] as health care POA   Would accept resuscitation but no prolonged machines   Not sure about tube feeds--but probably doesn't want      Right handed      Completed HS   Social Determinants of Health   Financial Resource Strain: Not on file  Food Insecurity: Not on file  Transportation Needs: Not on file  Physical Activity: Not on file  Stress: Not on file  Social Connections: Not on file    Allergies: No Known Allergies  Metabolic Disorder Labs: Lab Results  Component Value Date   HGBA1C 6.0 10/23/2020   Lab Results  Component Value Date   PROLACTIN 21.2 10/23/2020   Lab Results  Component Value Date   CHOL 262 (H) 05/27/2020   TRIG 178.0 (H) 05/27/2020   HDL 52.90 05/27/2020   CHOLHDL 5 05/27/2020   VLDL 35.6 05/27/2020   LDLCALC 173 (H) 05/27/2020   LDLCALC 146 (H) 05/14/2018   Lab Results  Component Value Date   TSH 1.35 10/23/2020   TSH 1.448 03/04/2019    Therapeutic Level Labs: No results found for: LITHIUM No results found for: VALPROATE No components found for:  CBMZ  Current Medications: Current Outpatient Medications  Medication Sig Dispense Refill  . albuterol (PROAIR HFA) 108 (90 Base) MCG/ACT inhaler Inhale 2 puffs into the lungs every 6 (six) hours as needed for wheezing or shortness of breath. 6.7 g 3  . cetirizine (ZYRTEC) 10 MG tablet Take 10 mg by mouth daily.    Marland Kitchen ibuprofen (ADVIL) 200 MG tablet Take 200 mg by mouth every 6 (six) hours as needed.    . risperiDONE (RISPERDAL) 1 MG tablet Take 1 tablet (1 mg total) by mouth at bedtime. 90 tablet 1  . traZODone (DESYREL) 50 MG tablet Take 1 tablet (50 mg total) by mouth at bedtime as needed for sleep. 90 tablet 1   No current facility-administered medications for this visit.     Musculoskeletal: Strength & Muscle Tone: UTA Gait & Station: UTA Patient leans: N/A  Psychiatric Specialty  Exam: Review of Systems  Psychiatric/Behavioral: Negative for agitation, behavioral problems, confusion, decreased concentration, dysphoric mood, hallucinations, self-injury, sleep disturbance and suicidal ideas. The patient is not nervous/anxious and is not hyperactive.   All other systems reviewed and are negative.   There were no vitals taken for this visit.There is no height or weight on file to calculate BMI.  General Appearance: UTA  Eye Contact:  UTA  Speech:  Clear and Coherent  Volume:  Normal  Mood:  Euthymic  Affect:  UTA  Thought Process:  Goal Directed and Descriptions of Associations: Intact  Orientation:  Full (Time, Place, and Person)  Thought Content: Logical  Suicidal Thoughts:  No  Homicidal Thoughts:  No  Memory:  Immediate;   Fair Recent;   Fair Remote;   Limited  Judgement:  Fair  Insight:  Fair  Psychomotor Activity:  UTA  Concentration:  Concentration: Fair and Attention Span: Fair  Recall:  [employer] of Knowledge: Fair  Language: Fair  Akathisia:  No  Handed:  Right  AIMS (if indicated): UTA  Assets:  Communication Skills Desire for Improvement Housing Social Support  ADL's:  Intact  Cognition: Baseline  Sleep:  Fair   Screenings: AUDIT   Flowsheet Row Admission (Discharged) from 11/18/2019 in [place]  Alcohol Use Disorder Identification Test Final Score (AUDIT) 0    PHQ2-9   Flowsheet Row Office Visit from 10/13/2020 in [place] Office Visit from 05/27/2020 in [place] at [facility] Visit from 12/18/2019 in [facility] [place] from 05/17/2019 in [place] at [facility] Visit from 04/11/2017 in [place] at [facility]  PHQ-2 Total Score 0 0 0 0 0    [place] Visit from 10/13/2020 in [place] Admission (Discharged) from 11/18/2019 in [place] ED from  11/17/2019 in [place] No Risk No Risk No Risk       Assessment and Plan: LOGYN DEDOMINICIS is a 70 year old African-American female, single, currently lives in [place], has a history of cognitive disorder, history of psychosis likely brief psychotic episode currently resolved was evaluated by telemedicine today.  Patient is currently stable.  Plan as noted below.  Plan Cognitive disorder/unspecified- improving Continue follow-up with neurology. She will benefit from neuropsychological testing in 1 to 2 years if she has any decompensation. Continue risperidone 1 mg p.o. nightly.  We will consider tapering it off in the future. MMSE-dated 10/13/2020-30 out of 30  Brief psychotic episode-resolved Continue risperidone as prescribed. Long-term plan is to taper off.   Insomnia-stable Trazodone 25-50 mg p.o. nightly as needed  High risk medication use- Reviewed and discussed the following labs- dated 10/23/2020-hemoglobin A1c-6, TSH-1.35, prolactin-21.2-within normal limits.   I have spent at least 18 minutes non face to face with patient today .    Follow-up in clinic in 4 months in office.   This note was generated in part or whole with voice recognition software. Voice recognition is usually quite accurate but there are transcription errors that can and very often do occur. I apologize for any typographical errors that were not detected and corrected.     Ursula Alert, [name] 01/12/2021, 10:21 AM

## 2021-05-03 DIAGNOSIS — H25813 Combined forms of age-related cataract, bilateral: Secondary | ICD-10-CM | POA: Diagnosis not present

## 2021-05-11 ENCOUNTER — Encounter: Payer: Self-pay | Admitting: Psychiatry

## 2021-05-11 ENCOUNTER — Ambulatory Visit (INDEPENDENT_AMBULATORY_CARE_PROVIDER_SITE_OTHER): Payer: Medicare Other | Admitting: Psychiatry

## 2021-05-11 ENCOUNTER — Other Ambulatory Visit: Payer: Self-pay

## 2021-05-11 VITALS — BP 119/80 | HR 79 | Temp 97.8°F | Wt 202.6 lb

## 2021-05-11 DIAGNOSIS — Z8659 Personal history of other mental and behavioral disorders: Secondary | ICD-10-CM

## 2021-05-11 DIAGNOSIS — G4701 Insomnia due to medical condition: Secondary | ICD-10-CM | POA: Diagnosis not present

## 2021-05-11 DIAGNOSIS — F0670 Mild neurocognitive disorder due to known physiological condition without behavioral disturbance: Secondary | ICD-10-CM | POA: Diagnosis not present

## 2021-05-11 DIAGNOSIS — R635 Abnormal weight gain: Secondary | ICD-10-CM | POA: Diagnosis not present

## 2021-05-11 MED ORDER — RISPERIDONE 1 MG PO TABS
1.0000 mg | ORAL_TABLET | Freq: Every day | ORAL | 1 refills | Status: DC
Start: 2021-05-11 — End: 2021-08-10

## 2021-05-11 MED ORDER — TRAZODONE HCL 50 MG PO TABS
50.0000 mg | ORAL_TABLET | Freq: Every evening | ORAL | 1 refills | Status: DC | PRN
Start: 2021-05-11 — End: 2021-12-02

## 2021-05-11 NOTE — Progress Notes (Signed)
[facility] MD OP Progress Note  05/11/2021 1:44 PM SHIRI HODAPP  MRN:  [id number]  Chief Complaint:  Chief Complaint   Follow-up; Hallucinations    HPI: Natalie [name] is a 70 year old African-American female, lives in [place], retired, has a history of psychosis likely brief psychotic episode, mild neurocognitive disorder, asthma, hypokalemia was evaluated in office today.  Patient today reports she is currently doing well with regards to her mood.  Denies any mood swings, irritability, anxiety.  Reports sleep is good.  Reports appetite is fair.  She however is concerned about her weight gain.  She has not been exercising.  She is aware that she needs to start doing it.  Motivated to do so.  She is also aware that she needs to start making healthy choices with regards to her meals.  She is compliant on medications.  Unknown if risperidone contributing to weight gain, she denies any other side effects.  She is not interested in tapering down the risperidone dosage at this time.  Patient denies any suicidality, homicidality or perceptual disturbances.  Did not appear to be paranoid or delusional.  Memory wise she is doing well.  Reports she has been able to manage her finances, chores, denies any episodes of getting lost, and having significant problem with memory.  She continues to have good support system from her family.  Patient denies any other concerns today.  Visit Diagnosis:    ICD-10-CM   1. Mild neurocognitive disorder due to multiple etiologies  F06.70     2. Insomnia due to medical condition  G47.01 risperiDONE (RISPERDAL) 1 MG tablet    traZODone (DESYREL) 50 MG tablet   mood    3. History of psychosis  Z86.59    brief psychotic episode    4. Weight gain  R63.5    unspecified      Past Psychiatric History: Reviewed past psychiatric history from progress note on 03/25/2019.  Past trials of Invega Sustenna, risperidone  Past Medical History:  Past Medical  History:  Diagnosis Date   Allergy    Arthritis    Asthma    Cataract    GERD (gastroesophageal reflux disease)    pt. denied   Heart murmur    was told years ago   History of shingles 2006   Hyperlipidemia     Past Surgical History:  Procedure Laterality Date   ABDOMINAL HYSTERECTOMY     COLONOSCOPY     POLYPECTOMY     VAGINAL DELIVERY     x2    Family Psychiatric History: Reviewed family psychiatric history from progress note on 03/25/2019  Family History:  Family History  Problem Relation Age of Onset   Arthritis Mother    Diabetes Mother    Hypertension Mother    Liver cancer Mother    Colon cancer Father 38   Pancreatic cancer Brother    Colon polyps Brother    Breast cancer Daughter 38   Alcohol abuse Other    Heart disease Neg Hx    Esophageal cancer Neg Hx    Rectal cancer Neg Hx    Stomach cancer Neg Hx     Social History: Reviewed social history from progress note on 03/25/2019 Social History   Socioeconomic History   Marital status: Single    Spouse name: Not on file   Number of children: 2   Years of education: Not on file   Highest education level: Not on file  Occupational History   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  Tobacco Use   Smoking status: Never   Smokeless tobacco: Never  Vaping Use   Vaping Use: Never used  Substance and Sexual Activity   Alcohol use: No   Drug use: No   Sexual activity: Not Currently  Other Topics Concern   Not on file  Social History Narrative   No living will   Requests daughter [name] as health care POA   Would accept resuscitation but no prolonged machines   Not sure about tube feeds--but probably doesn't want      Right handed      Completed HS   Social Determinants of Health   Financial Resource Strain: Not on file  Food Insecurity: Not on file  Transportation Needs: Not on file  Physical Activity: Not on file  Stress: Not on file  Social  Connections: Not on file    Allergies: No Known Allergies  Metabolic Disorder Labs: Lab Results  Component Value Date   HGBA1C 6.0 10/23/2020   Lab Results  Component Value Date   PROLACTIN 21.2 10/23/2020   Lab Results  Component Value Date   CHOL 262 (H) 05/27/2020   TRIG 178.0 (H) 05/27/2020   HDL 52.90 05/27/2020   CHOLHDL 5 05/27/2020   VLDL 35.6 05/27/2020   LDLCALC 173 (H) 05/27/2020   LDLCALC 146 (H) 05/14/2018   Lab Results  Component Value Date   TSH 1.35 10/23/2020   TSH 1.448 03/04/2019    Therapeutic Level Labs: No results found for: LITHIUM No results found for: VALPROATE No components found for:  CBMZ  Current Medications: Current Outpatient Medications  Medication Sig Dispense Refill   albuterol (PROAIR HFA) 108 (90 Base) MCG/ACT inhaler Inhale 2 puffs into the lungs every 6 (six) hours as needed for wheezing or shortness of breath. 6.7 g 3   cetirizine (ZYRTEC) 10 MG tablet Take 10 mg by mouth daily.     ibuprofen (ADVIL) 200 MG tablet Take 200 mg by mouth every 6 (six) hours as needed.     risperiDONE (RISPERDAL) 1 MG tablet Take 1 tablet (1 mg total) by mouth at bedtime. 90 tablet 1   traZODone (DESYREL) 50 MG tablet Take 1 tablet (50 mg total) by mouth at bedtime as needed for sleep. 90 tablet 1   No current facility-administered medications for this visit.     Musculoskeletal: Strength & Muscle Tone: within normal limits Gait & Station: normal Patient leans: N/A  Psychiatric Specialty Exam: Review of Systems  Psychiatric/Behavioral:  Negative for agitation, behavioral problems, confusion, decreased concentration, dysphoric mood, hallucinations, self-injury, sleep disturbance and suicidal ideas. The patient is not nervous/anxious and is not hyperactive.   All other systems reviewed and are negative.  Blood pressure 119/80, pulse 79, temperature 97.8 F (36.6 C), temperature source Temporal, weight 202 lb 9.6 oz (91.9 kg).Body mass index is  35.89 kg/m.  General Appearance: Casual  Eye Contact:  Fair  Speech:  Clear and Coherent  Volume:  Normal  Mood:  Euthymic  Affect:  Congruent  Thought Process:  Goal Directed and Descriptions of Associations: Intact  Orientation:  Full (Time, Place, and Person)  Thought Content: Logical   Suicidal Thoughts:  No  Homicidal Thoughts:  No  Memory:  Immediate;   Fair Recent;   Fair Remote;   Fair  Judgement:  Fair  Insight:  Good  Psychomotor Activity:  Normal  Concentration:  Concentration: Fair and Attention Span: Fair  Recall:  [employer] of Knowledge: Fair  Language: Fair  Akathisia:  No  Handed:  Right  AIMS (if indicated): done  Assets:  Communication Skills Desire for Improvement Housing Social Support  ADL's:  Intact  Cognition: WNL  Sleep:  Fair   Screenings: AUDIT    Flowsheet Row Admission (Discharged) from 11/18/2019 in [place]  Alcohol Use Disorder Identification Test Final Score (AUDIT) 0      PHQ2-9    [place] Office Visit from 05/11/2021 in [place] Office Visit from 10/13/2020 in [place] Office Visit from 05/27/2020 in [place] at [place] Visit from 12/18/2019 in [facility] [place] from 05/17/2019 in [place] at [place]  PHQ-2 Total Score 0 0 0 0 0      [place] Visit from 10/13/2020 in [place] Admission (Discharged) from 11/18/2019 in [place] ED from 11/17/2019 in [place] No Risk No Risk No Risk        Assessment and Plan: ALAYNNA KERWOOD is a 70 year old African-American female, single, currently lives in [place], has a history of cognitive disorder, history of psychosis likely brief psychotic episode, resolved was evaluated in office today.  Patient with  weight gain unknown if secondary to risperidone, is not interested in reducing the dosage at this time.  Discussed plan as noted below.  Plan Cognitive disorder/unspecified-stable Continue follow-up with neurology She will benefit from neuropsychological testing in 1 to 2 years if she has any decompensation. Risperidone 1 mg p.o. nightly. MMSE-10/13/2020-30/30  Brief psychotic disorder-per history-resolved Continue risperidone for now.  Patient is not interested in tapering it down today.  We will continue to reevaluate.  Insomnia-stable Trazodone 25-50 mg p.o. nightly as needed  Weight gain likely secondary to antipsychotics-BMI today 35.  Discussed reducing the dosage of risperidone, plan to taper it off.  Patient is not interested. Discussed exercise, staying active, diet management.  Follow-up in clinic in 3 months in person.   This note was generated in part or whole with voice recognition software. Voice recognition is usually quite accurate but there are transcription errors that can and very often do occur. I apologize for any typographical errors that were not detected and corrected.    Ursula Alert, MD 05/11/2021, 1:44 PM

## 2021-05-28 ENCOUNTER — Other Ambulatory Visit: Payer: Self-pay

## 2021-05-28 ENCOUNTER — Ambulatory Visit (INDEPENDENT_AMBULATORY_CARE_PROVIDER_SITE_OTHER): Payer: Medicare Other | Admitting: Internal Medicine

## 2021-05-28 ENCOUNTER — Encounter: Payer: Self-pay | Admitting: Internal Medicine

## 2021-05-28 VITALS — BP 102/76 | HR 82 | Temp 97.5°F | Ht 62.5 in | Wt 200.0 lb

## 2021-05-28 DIAGNOSIS — Z23 Encounter for immunization: Secondary | ICD-10-CM

## 2021-05-28 DIAGNOSIS — J4520 Mild intermittent asthma, uncomplicated: Secondary | ICD-10-CM | POA: Diagnosis not present

## 2021-05-28 DIAGNOSIS — G4701 Insomnia due to medical condition: Secondary | ICD-10-CM | POA: Diagnosis not present

## 2021-05-28 DIAGNOSIS — M159 Polyosteoarthritis, unspecified: Secondary | ICD-10-CM

## 2021-05-28 DIAGNOSIS — Z0000 Encounter for general adult medical examination without abnormal findings: Secondary | ICD-10-CM

## 2021-05-28 DIAGNOSIS — F29 Unspecified psychosis not due to a substance or known physiological condition: Secondary | ICD-10-CM | POA: Diagnosis not present

## 2021-05-28 LAB — COMPREHENSIVE METABOLIC PANEL
ALT: 9 U/L (ref 0–35)
AST: 17 U/L (ref 0–37)
Albumin: 4.3 g/dL (ref 3.5–5.2)
Alkaline Phosphatase: 51 U/L (ref 39–117)
BUN: 9 mg/dL (ref 6–23)
CO2: 31 mEq/L (ref 19–32)
Calcium: 9.6 mg/dL (ref 8.4–10.5)
Chloride: 104 mEq/L (ref 96–112)
Creatinine, Ser: 0.94 mg/dL (ref 0.40–1.20)
GFR: 60.67 mL/min (ref >60.00)
Glucose, Bld: 99 mg/dL (ref 70–99)
Potassium: 4.1 mEq/L (ref 3.5–5.1)
Sodium: 141 mEq/L (ref 135–145)
Total Bilirubin: 0.8 mg/dL (ref 0.2–1.2)
Total Protein: 7.5 g/dL (ref 6.0–8.3)

## 2021-05-28 LAB — LIPID PANEL
Cholesterol: 232 mg/dL — ABNORMAL HIGH (ref 0–200)
HDL: 52.2 mg/dL (ref >39.00)
LDL Cholesterol: 149 mg/dL — ABNORMAL HIGH (ref 0–99)
NonHDL: 179.38
Total CHOL/HDL Ratio: 4
Triglycerides: 152 mg/dL — ABNORMAL HIGH (ref 0.0–149.0)
VLDL: 30.4 mg/dL (ref 0.0–40.0)

## 2021-05-28 LAB — CBC
HCT: 45.8 % (ref 36.0–46.0)
Hemoglobin: 14.9 g/dL (ref 12.0–15.0)
MCHC: 32.4 g/dL (ref 30.0–36.0)
MCV: 92.6 fl (ref 78.0–100.0)
Platelets: 265 10*3/uL (ref 150.0–400.0)
RBC: 4.95 Mil/uL (ref 3.87–5.11)
RDW: 14 % (ref 11.5–15.5)
WBC: 4.1 10*3/uL (ref 4.0–10.5)

## 2021-05-28 LAB — T4, FREE: Free T4: 0.99 ng/dL (ref 0.60–1.60)

## 2021-05-28 NOTE — Assessment & Plan Note (Signed)
Has done well with the risperidone

## 2021-05-28 NOTE — Assessment & Plan Note (Signed)
Will occasionally use ibuprofen 400mg  with relief (left knee)

## 2021-05-28 NOTE — Assessment & Plan Note (Signed)
I have personally reviewed the Medicare Annual Wellness questionnaire and have noted 1. The patient's medical and social history 2. Their use of alcohol, tobacco or illicit drugs 3. Their current medications and supplements 4. The patient's functional ability including ADL's, fall risks, home safety risks and hearing or visual             impairment. 5. Diet and physical activities 6. Evidence for depression or mood disorders  The patients weight, height, BMI and visual acuity have been recorded in the chart I have made referrals, counseling and provided education to the patient based review of the above and I have provided the pt with a written personalized care plan for preventive services.  I have provided you with a copy of your personalized plan for preventive services. Please take the time to review along with your updated medication list.  Last colon in 2 years Due for mammogram soon Flu vaccine today Bivalent COVID vaccine soon Discussed exercise

## 2021-05-28 NOTE — Assessment & Plan Note (Signed)
Does okay with low dose trazodone

## 2021-05-28 NOTE — Progress Notes (Signed)
Subjective:    Patient ID: Natalie [name], female    DOB: 01-Dec-1950, 70 y.o.   MRN: [id number]  HPI Here for Medicare wellness visit and follow up of chronic health conditions This visit occurred during the SARS-CoV-2 public health emergency.  Safety protocols were in place, including screening questions prior to the visit, additional usage of staff PPE, and extensive cleaning of exam room while observing appropriate contact time as indicated for disinfecting solutions.   Reviewed advanced directives  Reviewed other doctors---Natalie [name]--psychiatry, Natalie [name]--neurology, Natalie [name] vision, Natalie Natalie [name] Vision is good Hearing is fine No regular exercise--discussed No alcohol or tobacco No falls No depression or anhedonia Independent with instrumental ADLs--still drives No sig memory issues  No recurrence of psychosis Doing well --keeps up with Natalie Natalie [name] Sleeps well with trazodone (sometimes only 1/2)  Uses cetirizine prn Hasn't needed the albuterol lately  Uses advil for arthritis--- 400mg  prn when knee acts up  Current Outpatient Medications on File Prior to Visit  Medication Sig Dispense Refill   albuterol (PROAIR HFA) 108 (90 Base) MCG/ACT inhaler Inhale 2 puffs into the lungs every 6 (six) hours as needed for wheezing or shortness of breath. 6.7 g 3   cetirizine (ZYRTEC) 10 MG tablet Take 10 mg by mouth daily.     ibuprofen (ADVIL) 200 MG tablet Take 200 mg by mouth every 6 (six) hours as needed.     risperiDONE (RISPERDAL) 1 MG tablet Take 1 tablet (1 mg total) by mouth at bedtime. 90 tablet 1   traZODone (DESYREL) 50 MG tablet Take 1 tablet (50 mg total) by mouth at bedtime as needed for sleep. 90 tablet 1   No current facility-administered medications on file prior to visit.    No Known Allergies  Past Medical History:  Diagnosis Date   Allergy    Arthritis    Asthma    Cataract    GERD (gastroesophageal reflux disease)    pt. denied   Heart murmur     was told years ago   History of shingles 2006   Hyperlipidemia     Past Surgical History:  Procedure Laterality Date   ABDOMINAL HYSTERECTOMY     COLONOSCOPY     POLYPECTOMY     VAGINAL DELIVERY     x2    Family History  Problem Relation Age of Onset   Arthritis Mother    Diabetes Mother    Hypertension Mother    Liver cancer Mother    Colon cancer Father 23   Pancreatic cancer Brother    Colon polyps Brother    Breast cancer Daughter 56   Alcohol abuse Other    Heart disease Neg Hx    Esophageal cancer Neg Hx    Rectal cancer Neg Hx    Stomach cancer Neg Hx     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  Tobacco Use   Smoking status: Never   Smokeless tobacco: Never  Vaping Use   Vaping Use: Never used  Substance and Sexual Activity   Alcohol use: No   Drug use: No   Sexual activity: Not Currently  Other Topics Concern   Not on file  Social History  Narrative   No living will   Requests daughter Natalie [name] as health care POA   Would accept resuscitation but no prolonged machines   Not sure about tube feeds--but probably doesn't want      Right handed      Completed HS   Social Determinants of Health   Financial Resource Strain: Not on file  Food Insecurity: Not on file  Transportation Needs: Not on file  Physical Activity: Not on file  Stress: Not on file  Social Connections: Not on file  Intimate Partner Violence: Not on file   Review of Systems Appetite is good Weight is fairly stable now--did gain with risperidone Wears seat belt No heartburn or dysphagia Bowels move fine--no blood Some urinary urgency--but no incontinence. No dysuria Left knee pain --no other sig issues No skin issues or  lesions. Benign moles Teeth okay---overdue for dentist No chest pain or SOB No dizziness or syncope No edema---but ankles are thick     Objective:   Physical Exam Constitutional:      Appearance: Normal appearance.  HENT:     Mouth/Throat:     Comments: No lesions Eyes:     Conjunctiva/sclera: Conjunctivae normal.     Pupils: Pupils are equal, round, and reactive to light.  Cardiovascular:     Rate and Rhythm: Normal rate and regular rhythm.     Pulses: Normal pulses.     Heart sounds: No murmur heard.   No gallop.  Pulmonary:     Effort: Pulmonary effort is normal.     Breath sounds: Normal breath sounds. No wheezing or rales.  Abdominal:     Palpations: Abdomen is soft.     Tenderness: There is no abdominal tenderness.  Musculoskeletal:     Cervical back: Neck supple.     Right lower leg: No edema.     Left lower leg: No edema.  Lymphadenopathy:     Cervical: No cervical adenopathy.  Skin:    Findings: No lesion or rash.  Neurological:     Mental Status: She is alert and oriented to person, place, and time.     Comments: President--- "Zoila Shutter, Obama" [id number] Recall 3/3  Psychiatric:        Mood and Affect: Mood normal.        Behavior: Behavior normal.           Assessment & Plan:

## 2021-05-28 NOTE — Assessment & Plan Note (Signed)
Quiet Hasn't needed the albuterol lately

## 2021-06-03 ENCOUNTER — Other Ambulatory Visit: Payer: Self-pay | Admitting: Internal Medicine

## 2021-06-03 DIAGNOSIS — Z1231 Encounter for screening mammogram for malignant neoplasm of breast: Secondary | ICD-10-CM

## 2021-07-09 ENCOUNTER — Ambulatory Visit
Admission: RE | Admit: 2021-07-09 | Discharge: 2021-07-09 | Disposition: A | Discharge status: Home / Self Care | Payer: Medicare Other | Source: Ambulatory Visit | Attending: Internal Medicine | Admitting: Internal Medicine

## 2021-07-09 ENCOUNTER — Other Ambulatory Visit: Payer: Self-pay

## 2021-07-09 DIAGNOSIS — Z1231 Encounter for screening mammogram for malignant neoplasm of breast: Secondary | ICD-10-CM | POA: Diagnosis not present

## 2021-08-10 ENCOUNTER — Encounter: Payer: Self-pay | Admitting: Psychiatry

## 2021-08-10 ENCOUNTER — Other Ambulatory Visit: Payer: Self-pay

## 2021-08-10 ENCOUNTER — Ambulatory Visit (INDEPENDENT_AMBULATORY_CARE_PROVIDER_SITE_OTHER): Payer: Medicare Other | Admitting: Psychiatry

## 2021-08-10 VITALS — BP 133/84 | HR 85 | Temp 98.1°F | Wt 200.4 lb

## 2021-08-10 DIAGNOSIS — G4701 Insomnia due to medical condition: Secondary | ICD-10-CM | POA: Diagnosis not present

## 2021-08-10 DIAGNOSIS — F0670 Mild neurocognitive disorder due to known physiological condition without behavioral disturbance: Secondary | ICD-10-CM

## 2021-08-10 DIAGNOSIS — F23 Brief psychotic disorder: Secondary | ICD-10-CM | POA: Diagnosis not present

## 2021-08-10 DIAGNOSIS — R635 Abnormal weight gain: Secondary | ICD-10-CM | POA: Diagnosis not present

## 2021-08-10 MED ORDER — RISPERIDONE 0.5 MG PO TABS: 0.5000 mg | ORAL_TABLET | Freq: Every day | ORAL | 0 refills | Status: DC

## 2021-08-10 NOTE — Progress Notes (Signed)
[facility] MD OP Progress Note  08/10/2021 2:54 PM Natalie [name]  MRN:  [id number]  Chief Complaint:  Chief Complaint   Follow-up; Depression    HPI: Natalie [name] is a 71 year old African-American female, lives in [place], retired, has a history of psychosis likely brief psychotic episode, mild neurocognitive disorder, asthma, hypokalemia was evaluated in office today.  Patient today reports she is currently doing well with regards to her mood.  Has not had any sadness, anxiety symptoms.  Has not had any mood swings.  She reports she has not had any psychotic episodes, did not appear to be preoccupied with any delusions or paranoia.  Denied any auditory or visual hallucinations.  Patient reports memory as good.  She has been managing her day-to-day life okay, lives by herself, has been able to take care of her chores and is pretty independent.  Patient reports sleep is good.  Patient would like to reduce the dosage of risperidone to see if she would tolerate it at a lower dosage this time.  Patient denies any suicidality or homicidality.  Denies any other concerns today.  Visit Diagnosis:    ICD-10-CM   1. Mild neurocognitive disorder due to multiple etiologies  F06.70 risperiDONE (RISPERDAL) 0.5 MG tablet    2. Insomnia due to medical condition  G47.01    mood    3. Brief psychotic disorder (HCC)  F23 risperiDONE (RISPERDAL) 0.5 MG tablet   in remission    4. Weight gain  R63.5    unspecified      Past Psychiatric History: Reviewed past psychiatric history from progress note on 03/25/2019.  Past trials of Invega Sustenna, risperidone.  Past Medical History:  Past Medical History:  Diagnosis Date   Allergy    Arthritis    Asthma    Cataract    GERD (gastroesophageal reflux disease)    pt. denied   Heart murmur    was told years ago   History of shingles 2006   Hyperlipidemia     Past Surgical History:  Procedure Laterality Date   ABDOMINAL HYSTERECTOMY      COLONOSCOPY     POLYPECTOMY     VAGINAL DELIVERY     x2    Family Psychiatric History: Reviewed family psychiatric history from progress note on 03/25/2019  Family History:  Family History  Problem Relation Age of Onset   Arthritis Mother    Diabetes Mother    Hypertension Mother    Liver cancer Mother    Colon cancer Father 103   Pancreatic cancer Brother    Colon polyps Brother    Breast cancer Daughter 49   Alcohol abuse Other    Heart disease Neg Hx    Esophageal cancer Neg Hx    Rectal cancer Neg Hx    Stomach cancer Neg Hx     Social History: Reviewed social history from progress note on 03/25/2019 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  Tobacco Use   Smoking status: Never   Smokeless tobacco: Never  Vaping Use   Vaping Use: Never used  Substance and Sexual Activity   Alcohol use: No   Drug use: No   Sexual activity: Not Currently  Other Topics Concern   Not on file  Social History Narrative   No living will   Requests daughter [name] as health care POA   Would accept resuscitation but no prolonged machines   Not sure about tube feeds--but probably doesn't want      Right handed      Completed HS   Social Determinants of Health   Financial Resource Strain: Not on file  Food Insecurity: Not on file  Transportation Needs: Not on file  Physical Activity: Not on file  Stress: Not on file  Social Connections: Not on file    Allergies: No Known Allergies  Metabolic Disorder Labs: Lab Results  Component Value Date   HGBA1C 6.0 10/23/2020   Lab Results  Component Value Date   PROLACTIN 21.2 10/23/2020   Lab Results  Component Value Date   CHOL 232 (H) 05/28/2021   TRIG 152.0 (H) 05/28/2021    HDL 52.20 05/28/2021   CHOLHDL 4 05/28/2021   VLDL 30.4 05/28/2021   LDLCALC 149 (H) 05/28/2021   LDLCALC 173 (H) 05/27/2020   Lab Results  Component Value Date   TSH 1.35 10/23/2020   TSH 1.448 03/04/2019    Therapeutic Level Labs: No results found for: LITHIUM No results found for: VALPROATE No components found for:  CBMZ  Current Medications: Current Outpatient Medications  Medication Sig Dispense Refill   albuterol (PROAIR HFA) 108 (90 Base) MCG/ACT inhaler Inhale 2 puffs into the lungs every 6 (six) hours as needed for wheezing or shortness of breath. 6.7 g 3   cetirizine (ZYRTEC) 10 MG tablet Take 10 mg by mouth daily.     ibuprofen (ADVIL) 200 MG tablet Take 200 mg by mouth every 6 (six) hours as needed.     risperiDONE (RISPERDAL) 0.5 MG tablet Take 1 tablet (0.5 mg total) by mouth at bedtime. 90 tablet 0   traZODone (DESYREL) 50 MG tablet Take 1 tablet (50 mg total) by mouth at bedtime as needed for sleep. 90 tablet 1   No current facility-administered medications for this visit.     Musculoskeletal: Strength & Muscle Tone: within normal limits Gait & Station: normal Patient leans: N/A  Psychiatric Specialty Exam: Review of Systems  Psychiatric/Behavioral:  Negative for agitation, behavioral problems, confusion, decreased concentration, dysphoric mood, hallucinations, self-injury, sleep disturbance and suicidal ideas. The patient is not nervous/anxious and is not hyperactive.   All other systems reviewed and are negative.  Blood pressure 133/84, pulse 85, temperature 98.1 F (36.7 C), temperature source Temporal, weight 200 lb 6.4 oz (90.9 kg).Body mass index is 36.07 kg/m.  General Appearance: Casual  Eye Contact:  Fair  Speech:  Clear and Coherent  Volume:  Normal  Mood:  Euthymic  Affect:  Congruent  Thought Process:  Goal Directed and Descriptions of Associations: Intact  Orientation:  Full (Time, Place, and Person)  Thought Content: Logical    Suicidal Thoughts:  No  Homicidal Thoughts:  No  Memory:  Immediate;   Fair Recent;   Fair Remote;   Fair  Judgement:  Fair  Insight:  Fair  Psychomotor Activity:  Normal  Concentration:  Concentration: Fair and Attention Span: Fair  Recall:  [employer] of Knowledge: Fair  Language: Fair  Akathisia:  No  Handed:  Right  AIMS (if indicated): done,0  Assets:  Communication Skills Desire for Improvement Housing Social Support  ADL's:  Intact  Cognition: WNL  Sleep:  Fair   Screenings: AIMS    Flowsheet Row  Office Visit from 08/10/2021 in [facility] Total Score 0      AUDIT    Flowsheet Row Admission (Discharged) from 11/18/2019 in [facility]  Alcohol Use Disorder Identification Test Final Score (AUDIT) 0      PHQ2-9    [facility] Office Visit from 08/10/2021 in [facility] Office Visit from 05/11/2021 in [facility] Office Visit from 10/13/2020 in [facility] Office Visit from 05/27/2020 in [facility] Visit from 12/18/2019 in [facility]   PHQ-2 Total Score 0 0 0 0 [facility] Visit from 08/10/2021 in [facility] Office Visit from 10/13/2020 in [facility] Admission (Discharged) from 11/18/2019 in [facility] Error: Question 6 not populated No Risk No Risk        Assessment and Plan: Natalie [name] is a 71 year old African-American female, single, currently lives in [place], has a history of cognitive disorder, history of psychosis likely brief psychotic episode, was evaluated in office today.  She is currently stable.  Patient is currently interested in being tapered off of the risperidone.  Discussed plan as noted below.  Plan Mild neurocognitive  disorder-stable Continue follow-up with neurology Will benefit from neuropsychological testing in 1 to 2 years if she has any decompensation. We will reduce risperidone to 0.5 mg p.o. nightly.  Brief psychotic episode-remission We will reduce risperidone to 0.5 mg p.o. nightly.  Patient to monitor herself for any worsening mood symptoms or psychosis.  Insomnia-stable Trazodone 25-50 mg p.o. nightly as needed  Weight gain likely secondary to antipsychotics-improving Patient advised to continue to monitor her weight, stay active, diet management.  Follow-up in clinic in 2 months or sooner if needed.  This note was generated in part or whole with voice recognition software. Voice recognition is usually quite accurate but there are transcription errors that can and very often do occur. I apologize for any typographical errors that were not detected and corrected.    Ursula Alert, MD 08/10/2021, 2:54 PM

## 2021-10-07 ENCOUNTER — Encounter: Payer: Self-pay | Admitting: Psychiatry

## 2021-10-07 ENCOUNTER — Ambulatory Visit (INDEPENDENT_AMBULATORY_CARE_PROVIDER_SITE_OTHER): Payer: Medicare Other | Admitting: Psychiatry

## 2021-10-07 ENCOUNTER — Other Ambulatory Visit: Payer: Self-pay

## 2021-10-07 VITALS — BP 112/68 | HR 97 | Temp 98.3°F | Wt 199.4 lb

## 2021-10-07 DIAGNOSIS — R635 Abnormal weight gain: Secondary | ICD-10-CM

## 2021-10-07 DIAGNOSIS — F0670 Mild neurocognitive disorder due to known physiological condition without behavioral disturbance: Secondary | ICD-10-CM

## 2021-10-07 DIAGNOSIS — F23 Brief psychotic disorder: Secondary | ICD-10-CM

## 2021-10-07 DIAGNOSIS — G4701 Insomnia due to medical condition: Secondary | ICD-10-CM

## 2021-10-07 NOTE — Progress Notes (Signed)
[facility] MD OP Progress Note  10/07/2021 2:52 PM Natalie [name]  MRN:  [id number]  Chief Complaint:  Chief Complaint  Patient presents with   Follow-up: 71 year old African-American female with history of mild neurocognitive disorder, insomnia, brief psychotic disorder in remission, presented for medication management.   HPI: Natalie [name] is a 70 year old African-American female, lives in [place], retired, has a history of psychosis likely brief psychotic episode, mild neurocognitive disorder, asthma, hypokalemia was evaluated in office today.  Patient today appeared to be alert, oriented to person place time and situation.  Patient cooperate, pleasant in session today.  Patient reports overall she is currently doing well.  Denies any significant mood symptoms, denies mood swings, sadness or anxiety.  Denies any perceptual disturbances, did not appear to be preoccupied with any delusions or paranoia.  Denies any suicidality, homicidality .  Reports sleep as good.  Reports appetite as fair.  Patient reports she had recent labs done when her hemoglobin A1c has come down to around 5.2.  She is currently eating healthy and staying active.  Patient is compliant on the risperidone lower dosage and has not had any changes in her mood symptoms or sleep.  She is tolerating it well.  She started the lower dosage beginning of February.  She is not interested in cutting back more and wants to wait until she is done with this supply of the 0.5 mg for now.  Denies any other concerns today.    Visit Diagnosis:    ICD-10-CM   1. Mild neurocognitive disorder due to multiple etiologies  F06.70     2. Insomnia due to medical condition  G47.01    mood    3. Brief psychotic disorder ([facility])  F23     4. Weight gain  R63.5    unspecified      Past Psychiatric History: Reviewed past psychiatric history from progress note on 03/25/2019.  Past trials of Invega Sustenna, risperidone.  Past Medical  History:  Past Medical History:  Diagnosis Date   Allergy    Arthritis    Asthma    Cataract    GERD (gastroesophageal reflux disease)    pt. denied   Heart murmur    was told years ago   History of shingles 2006   Hyperlipidemia     Past Surgical History:  Procedure Laterality Date   ABDOMINAL HYSTERECTOMY     COLONOSCOPY     POLYPECTOMY     VAGINAL DELIVERY     x2    Family Psychiatric History: Reviewed family psychiatric history from progress note on 03/25/2019  Family History:  Family History  Problem Relation Age of Onset   Arthritis Mother    Diabetes Mother    Hypertension Mother    Liver cancer Mother    Colon cancer Father 26   Pancreatic cancer Brother    Colon polyps Brother    Breast cancer Daughter 77   Alcohol abuse Other    Heart disease Neg Hx    Esophageal cancer Neg Hx    Rectal cancer Neg Hx    Stomach cancer Neg Hx     Social History: Reviewed social history from progress note on 03/25/2019. 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    Comment: retired  Tobacco Use   Smoking status: Never   Smokeless tobacco: Never  Vaping Use   Vaping Use: Never used  Substance and Sexual Activity   Alcohol use: No   Drug use: No   Sexual activity: Not Currently  Other Topics Concern   Not on file  Social History Narrative   No living will   Requests daughter Natalie [name] as health care POA   Would accept resuscitation but no prolonged machines   Not sure about tube feeds--but probably doesn't want      Right handed      Completed HS   Social Determinants of Health   Financial Resource Strain: Not on file  Food Insecurity: Not on file  Transportation Needs: Not on file  Physical Activity: Not on file  Stress: Not  on file  Social Connections: Not on file    Allergies: No Known Allergies  Metabolic Disorder Labs: Lab Results  Component Value Date   HGBA1C 6.0 10/23/2020   Lab Results  Component Value Date   PROLACTIN 21.2 10/23/2020   Lab Results  Component Value Date   CHOL 232 (H) 05/28/2021   TRIG 152.0 (H) 05/28/2021   HDL 52.20 05/28/2021   CHOLHDL 4 05/28/2021   VLDL 30.4 05/28/2021   LDLCALC 149 (H) 05/28/2021   LDLCALC 173 (H) 05/27/2020   Lab Results  Component Value Date   TSH 1.35 10/23/2020   TSH 1.448 03/04/2019    Therapeutic Level Labs: No results found for: LITHIUM No results found for: VALPROATE No components found for:  CBMZ  Current Medications: Current Outpatient Medications  Medication Sig Dispense Refill   albuterol (PROAIR HFA) 108 (90 Base) MCG/ACT inhaler Inhale 2 puffs into the lungs every 6 (six) hours as needed for wheezing or shortness of breath. 6.7 g 3   cetirizine (ZYRTEC) 10 MG tablet Take 10 mg by mouth daily.     ibuprofen (ADVIL) 200 MG tablet Take 200 mg by mouth every 6 (six) hours as needed.     risperiDONE (RISPERDAL) 0.5 MG tablet Take 1 tablet (0.5 mg total) by mouth at bedtime. 90 tablet 0   traZODone (DESYREL) 50 MG tablet Take 1 tablet (50 mg total) by mouth at bedtime as needed for sleep. 90 tablet 1   No current facility-administered medications for this visit.     Musculoskeletal: Strength & Muscle Tone: within normal limits Gait & Station: normal Patient leans: N/A  Psychiatric Specialty Exam: Review of Systems  Psychiatric/Behavioral:  Negative for agitation, behavioral problems, confusion, decreased concentration, dysphoric mood, hallucinations, self-injury, sleep disturbance and suicidal ideas. The patient is not nervous/anxious and is not hyperactive.   All other systems reviewed and are negative.  Blood pressure 112/68, pulse 97, temperature 98.3 F (36.8 C), temperature source Temporal, weight 199 lb 6.4 oz (90.4  kg).Body mass index is 35.89 kg/m.  General Appearance: Casual  Eye Contact:  Fair  Speech:  Clear and Coherent  Volume:  Normal  Mood:  Euthymic  Affect:  Congruent  Thought Process:  Goal Directed and Descriptions of Associations: Intact  Orientation:  Full (Time, Place, and Person)  Thought Content: Logical   Suicidal Thoughts:  No  Homicidal Thoughts:  No  Memory:  Immediate;   Fair Recent;   Fair Remote;   Fair  Judgement:  Fair  Insight:  Fair  Psychomotor Activity:  Normal  Concentration:  Concentration: Fair and Attention Span: Fair  Recall:  [place] of Knowledge: Fair  Language: Fair  Akathisia:  No  Handed:  Right  AIMS (if indicated): done  Assets:  Communication Skills Desire for Improvement Housing Social Support  ADL's:  Intact  Cognition: WNL  Sleep:  Fair   Screenings: [facility] Office Visit from 10/07/2021 in [facility] Office Visit from 08/10/2021 in [facility] Total Score 0 0      AUDIT    Flowsheet Row Admission (Discharged) from 11/18/2019 in [facility]  Alcohol Use Disorder Identification Test Final Score (AUDIT) 0      PHQ2-9    [facility] Office Visit from 10/07/2021 in [facility] Office Visit from 08/10/2021 in [facility] Visit from 05/11/2021 in [facility] Visit from 10/13/2020 in [facility] Visit from 05/27/2020 in [facility]  PHQ-2 Total Score 0 0 0 0 0  PHQ-9 Total Score 0 -- -- -- --      [facility] Office Visit from 08/10/2021 in [facility] Office Visit from 10/13/2020 in [facility] Admission (Discharged) from 11/18/2019 in [facility] Error: Question 6 not populated No  Risk No Risk        Assessment and Plan: VIOLETT HOBBS is a 71 year old African-American female, single, currently lives in [place], has a history of cognitive disorder, history of psychosis likely brief psychotic episode was evaluated in office today.  Patient is currently tolerating the lower dosage of risperidone, plan as noted below.  Plan  Mild neurocognitive disorder-stable Continue follow-up with neurology as needed Patient will benefit from neuropsychological testing in 1 to 2 years if she has any decompensation Continue reduced dosage of risperidone 0.5 mg p.o. nightly.  We will consider reducing the dosage further future sessions.  She is not interested at this time  Brief psychotic episode-remission Continue risperidone 0.5 mg p.o. nightly  Insomnia-stable Trazodone 25-50 mg p.o. nightly as needed  Weight gain likely secondary to antipsychotics-improving Continue exercise, diet management.  Follow-up in clinic in 2 months or sooner if needed.   Collaboration of Care: Collaboration of Care: Other encouraged to continue to follow-up with neurology.  Patient/Guardian was advised Release of Information must be obtained prior to any record release in order to collaborate their care with an outside provider. Patient/Guardian was advised if they have not already done so to contact the registration department to sign all necessary forms in order for [place] to release information regarding their care.   Consent: Patient/Guardian gives verbal consent for treatment and assignment of benefits for services provided during this visit. Patient/Guardian expressed understanding and agreed to proceed.   This note was generated in part or whole with voice recognition software. Voice recognition is usually quite accurate but there are transcription errors that can and very often do occur. I apologize for any typographical errors that were not detected and corrected.      Ursula Alert, MD 10/07/2021, 2:52 PM

## 2021-12-02 ENCOUNTER — Encounter: Payer: Self-pay | Admitting: Psychiatry

## 2021-12-02 ENCOUNTER — Telehealth: Payer: Self-pay

## 2021-12-02 ENCOUNTER — Ambulatory Visit (INDEPENDENT_AMBULATORY_CARE_PROVIDER_SITE_OTHER): Payer: Medicare Other | Admitting: Psychiatry

## 2021-12-02 VITALS — BP 131/83 | HR 102 | Ht 62.5 in | Wt 192.0 lb

## 2021-12-02 DIAGNOSIS — G4701 Insomnia due to medical condition: Secondary | ICD-10-CM

## 2021-12-02 DIAGNOSIS — F23 Brief psychotic disorder: Secondary | ICD-10-CM

## 2021-12-02 DIAGNOSIS — F0670 Mild neurocognitive disorder due to known physiological condition without behavioral disturbance: Secondary | ICD-10-CM

## 2021-12-02 MED ORDER — TRAZODONE HCL 50 MG PO TABS: 25.0000 mg | ORAL_TABLET | Freq: Every evening | ORAL | 1 refills | Status: DC | PRN

## 2021-12-02 MED ORDER — RISPERIDONE 0.25 MG PO TABS: 0.2500 mg | ORAL_TABLET | Freq: Every day | ORAL | 1 refills | Status: DC

## 2021-12-02 NOTE — Telephone Encounter (Signed)
Patient states that she has been having increased issues with breathing when going up stairs. She has not used inhaler recently. Symptoms have been going on for a long time. No change or increase.  ? ?Would like sent to [facility]  ?

## 2021-12-02 NOTE — Progress Notes (Signed)
[facility] OP Progress Note ? ?12/02/2021 12:02 PM ?Natalie [name]  ?MRN:  [id number] ? ?Chief Complaint:  ?Chief Complaint  ?Patient presents with  ? Follow-up: 71 year old African-American female with history of mild neurocognitive disorder, insomnia, brief psychotic disorder in remission, presented for medication management.  ? ?HPI: Natalie [name] is a 71 year old African-American female, lives in [place], retired, has a history of psychosis likely brief psychotic episode, mild neurocognitive disorder, asthma, hypokalemia was evaluated in office today. ? ?Patient today reports she is currently doing well with regards to her mood.  Denies any sadness or mood swings.  Denies any anxiety symptoms. ? ?Patient reports sleep is good.  Currently takes trazodone and that helps.  Patient does report a history of sleep apnea currently noncompliant on CPAP. ? ?Patient continues to be compliant on the risperidone lower dosage, has not noticed any worsening mood symptoms since being on this dose.  Denies any perceptual disturbances.  Did not appear to be paranoid or delusional. ? ?Patient denies any suicidality or homicidality. ? ?Patient is alert, oriented to person place time situation. ? ?Visit Diagnosis:  ?  ICD-10-CM   ?1. Mild neurocognitive disorder due to multiple etiologies  F06.70   ?  ?2. Insomnia due to medical condition  G47.01 traZODone (DESYREL) 50 MG tablet  ?  risperiDONE (RISPERDAL) 0.25 MG tablet  ? mood, OSA noncompliant on CPAP  ?  ?3. Brief psychotic disorder (HCC)  F23 risperiDONE (RISPERDAL) 0.25 MG tablet  ?  ? ? ?Past Psychiatric History: Reviewed past psychiatric history from progress note on 03/25/2019.  Past trials of Invega Sustenna, risperidone. ? ?Past Medical History:  ?Past Medical History:  ?Diagnosis Date  ? Allergy   ? Arthritis   ? Asthma   ? Cataract   ? GERD (gastroesophageal reflux disease)   ? pt. denied  ? Heart murmur   ? was told years ago  ? History of shingles 2006  ?  Hyperlipidemia   ?  ?Past Surgical History:  ?Procedure Laterality Date  ? ABDOMINAL HYSTERECTOMY    ? COLONOSCOPY    ? POLYPECTOMY    ? VAGINAL DELIVERY    ? x2  ? ? ?Family Psychiatric History: Reviewed family psychiatric history from progress note on 03/25/2019. ? ?Family History:  ?Family History  ?Problem Relation Age of Onset  ? Arthritis Mother   ? Diabetes Mother   ? Hypertension Mother   ? Liver cancer Mother   ? Colon cancer Father 46  ? Pancreatic cancer Brother   ? Colon polyps Brother   ? Breast cancer Daughter 30  ? Alcohol abuse Other   ? Heart disease Neg Hx   ? Esophageal cancer Neg Hx   ? Rectal cancer Neg Hx   ? Stomach cancer Neg Hx   ? ? ?Social History: Reviewed social history from progress note on 03/25/2019. ?Social History  ? ?Socioeconomic History  ? Marital status: Single  ?  Spouse name: Not on file  ? Number of children: 2  ? Years of education: Not on file  ? Highest education level: Not on file  ?Occupational History  ? 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  Comment: retired  ?Tobacco Use  ? Smoking status: Never  ? Smokeless tobacco: Never  ?Vaping Use  ? Vaping Use: Never used  ?Substance and Sexual Activity  ? Alcohol use: No  ? Drug use: No  ? Sexual activity: Not Currently  ?Other Topics Concern  ? Not  on file  ?Social History Narrative  ? No living will  ? Requests daughter [name] as health care POA  ? Would accept resuscitation but no prolonged machines  ? Not sure about tube feeds--but probably doesn't want  ?   ? Right handed  ?   ? Completed HS  ? ?Social Determinants of Health  ? ?Financial Resource Strain: Not on file  ?Food Insecurity: Not on file  ?Transportation Needs: Not on file  ?Physical Activity: Not on file  ?Stress: Not on file  ?Social Connections: Not on file  ? ? ?Allergies: No Known Allergies ? ?Metabolic Disorder Labs: ?Lab Results  ?Component Value Date  ? HGBA1C 6.0 10/23/2020  ? ?Lab Results  ?Component Value Date  ? PROLACTIN 21.2  10/23/2020  ? ?Lab Results  ?Component Value Date  ? CHOL 232 (H) 05/28/2021  ? TRIG 152.0 (H) 05/28/2021  ? HDL 52.20 05/28/2021  ? CHOLHDL 4 05/28/2021  ? VLDL 30.4 05/28/2021  ? LDLCALC 149 (H) 05/28/2021  ? LDLCALC 173 (H) 05/27/2020  ? ?Lab Results  ?Component Value Date  ? TSH 1.35 10/23/2020  ? TSH 1.448 03/04/2019  ? ? ?Therapeutic Level Labs: ?No results found for: LITHIUM ?No results found for: VALPROATE ?No components found for:  CBMZ ? ?Current Medications: ?Current Outpatient Medications  ?Medication Sig Dispense Refill  ? cetirizine (ZYRTEC) 10 MG tablet Take 10 mg by mouth daily.    ? ibuprofen (ADVIL) 200 MG tablet Take 200 mg by mouth every 6 (six) hours as needed.    ? risperiDONE (RISPERDAL) 0.25 MG tablet Take 1 tablet (0.25 mg total) by mouth at bedtime. 90 tablet 1  ? traZODone (DESYREL) 50 MG tablet Take 0.5-1 tablets (25-50 mg total) by mouth at bedtime as needed for sleep. 90 tablet 1  ? albuterol (PROAIR HFA) 108 (90 Base) MCG/ACT inhaler Inhale 2 puffs into the lungs every 6 (six) hours as needed for wheezing or shortness of breath. 18 g 1  ? ?No current facility-administered medications for this visit.  ? ? ? ?Musculoskeletal: ?Strength & Muscle Tone: within normal limits ?Gait & Station: normal ?Patient leans: N/A ? ?Psychiatric Specialty Exam: ?Review of Systems  ?Psychiatric/Behavioral:  Negative for agitation, behavioral problems, confusion, decreased concentration, dysphoric mood, hallucinations, self-injury, sleep disturbance and suicidal ideas. The patient is not nervous/anxious and is not hyperactive.   ?All other systems reviewed and are negative.  ?Blood pressure 131/83, pulse (!) 102, height 5' 2.5" (1.588 m), weight 192 lb (87.1 kg).Body mass index is 34.56 kg/m?.  ?General Appearance: Casual  ?Eye Contact:  Good  ?Speech:  Clear and Coherent  ?Volume:  Normal  ?Mood:  Euthymic  ?Affect:  Congruent  ?Thought Process:  Goal Directed and Descriptions of Associations: Intact   ?Orientation:  Full (Time, Place, and Person)  ?Thought Content: Logical   ?Suicidal Thoughts:  No  ?Homicidal Thoughts:  No  ?Memory:  Immediate;   Fair ?Recent;   Fair ?Remote;   Fair  ?Judgement:  Fair  ?Insight:  Fair  ?Psychomotor Activity:  Normal  ?Concentration:  Concentration: Fair and Attention Span: Fair  ?Recall:  Fair  ?Fund of Knowledge: Fair  ?Language: Fair  ?Akathisia:  No  ?Handed:  Right  ?AIMS (if indicated): done  ?Assets:  Communication Skills ?Desire for Improvement ?Housing ?Social Support  ?ADL's:  Intact  ?Cognition: WNL  ?Sleep:  Fair  ? ?Screenings: ?AIMS   ? ?[place] Office Visit from 12/02/2021 in [facility]  Visit from 10/07/2021 in [facility] Office Visit from 08/10/2021 in [facility]  ?AIMS Total Score 0 0 0  ? ?  ? ?AUDIT   ? ?Flowsheet Row Admission (Discharged) from 11/18/2019 in [facility]  ?Alcohol Use Disorder Identification Test Final Score (AUDIT) 0  ? ?  ? ?PHQ2-9   ? ?[facility] Office Visit from 12/02/2021 in [facility] Office Visit from 10/07/2021 in [facility] Office Visit from 08/10/2021 in [facility] Office Visit from 05/11/2021 in [facility] Visit from 10/13/2020 in [facility]  ?PHQ-2 Total Score 0 0 0 0 0  ?PHQ-9 Total Score 0 0 -- -- --  ? ?  ? ?[facility] Office Visit from 12/02/2021 in [facility] Office Visit from 08/10/2021 in [facility] Office Visit from 10/13/2020 in [facility]  ?C-SSRS RISK CATEGORY No Risk Error: Question 6 not populated No Risk  ? ?  ? ? ? ?Assessment and Plan: MASHA ORBACH is a 71 year old African-American female, single, currently lives in [place], has a history of cognitive  disorder, history of psychosis likely brief psychotic episode was evaluated in office today.  Patient is currently being tapered off of the risperidone, tolerating it well.  Plan as noted below. ? ?Plan ?Mild neurocogn

## 2021-12-02 NOTE — Telephone Encounter (Signed)
Will forward to Dr Silvio Pate to approve as it has not been filled in over 2 years. ?

## 2021-12-02 NOTE — Addendum Note (Signed)
Addended by: Pilar Grammes on: 12/02/2021 04:46 PM ? ? Modules accepted: Orders ? ?

## 2021-12-03 MED ORDER — ALBUTEROL SULFATE HFA 108 (90 BASE) MCG/ACT IN AERS: 2.0000 | INHALATION_SPRAY | Freq: Four times a day (QID) | RESPIRATORY_TRACT | 1 refills | Status: AC | PRN

## 2021-12-03 NOTE — Telephone Encounter (Signed)
Please let her know I sent the Rx to [facility] ?

## 2021-12-03 NOTE — Telephone Encounter (Signed)
Spoke to pt

## 2021-12-03 NOTE — Addendum Note (Signed)
Addended by: Viviana Simpler I on: 12/03/2021 07:25 AM ? ? Modules accepted: Orders ? ?

## 2022-02-24 ENCOUNTER — Encounter: Payer: Self-pay | Admitting: Psychiatry

## 2022-02-24 ENCOUNTER — Ambulatory Visit (INDEPENDENT_AMBULATORY_CARE_PROVIDER_SITE_OTHER): Payer: Medicare Other | Admitting: Psychiatry

## 2022-02-24 VITALS — BP 99/63 | HR 99 | Temp 98.2°F | Wt 199.8 lb

## 2022-02-24 DIAGNOSIS — G4701 Insomnia due to medical condition: Secondary | ICD-10-CM

## 2022-02-24 DIAGNOSIS — F0670 Mild neurocognitive disorder due to known physiological condition without behavioral disturbance: Secondary | ICD-10-CM | POA: Diagnosis not present

## 2022-02-24 DIAGNOSIS — F23 Brief psychotic disorder: Secondary | ICD-10-CM | POA: Diagnosis not present

## 2022-02-24 MED ORDER — RISPERIDONE 0.25 MG PO TABS: 0.2500 mg | ORAL_TABLET | Freq: Every evening | ORAL | 1 refills | Status: DC | PRN

## 2022-02-24 NOTE — Progress Notes (Signed)
[facility] MD OP Progress Note  02/24/2022 3:26 PM Natalie [name]  MRN:  [id number]  Chief Complaint:  Chief Complaint  Patient presents with   Follow-up: 71 year old African-American female with history of mild neurocognitive disorder, insomnia, brief psychotic disorder in remission, presented for medication management.   HPI: Natalie [name] is a 71 year old African-American female, lives in [place], retired, has a history of psychosis likely brief psychotic episode, mild neurocognitive disorder, asthma, hypokalemia was evaluated in office today.  Patient today reports she is currently doing fairly well.  Denies any mood swings, psychosis.  Reports sleep as good.  Reports appetite is fair.  Denies side effects to the risperidone.  Reports she has been taking it every other night with plan to taper it down.  Has not noticed any worsening mood symptoms or psychosis since taking a lower dosage.  Patient denies any side effects to the trazodone and uses it for her sleep.  Patient denies any suicidality, homicidality.  Patient denies any other concerns today.  Visit Diagnosis:    ICD-10-CM   1. Mild neurocognitive disorder due to multiple etiologies  F06.70     2. Insomnia due to medical condition  G47.01    mood, OSA noncompliant on CPAP    3. Brief psychotic disorder ([facility])  F23       Past Psychiatric History: Reviewed past psychiatric history from progress note on 03/25/2019.  Past trials of Invega Sustenna, risperidone.  Past Medical History:  Past Medical History:  Diagnosis Date   Allergy    Arthritis    Asthma    Cataract    GERD (gastroesophageal reflux disease)    pt. denied   Heart murmur    was told years ago   History of shingles 2006   Hyperlipidemia     Past Surgical History:  Procedure Laterality Date   ABDOMINAL HYSTERECTOMY     COLONOSCOPY     POLYPECTOMY     VAGINAL DELIVERY     x2    Family Psychiatric History: Reviewed family psychiatric  history from progress note on 03/25/2019.  Family History:  Family History  Problem Relation Age of Onset   Arthritis Mother    Diabetes Mother    Hypertension Mother    Liver cancer Mother    Colon cancer Father 76   Pancreatic cancer Brother    Colon polyps Brother    Breast cancer Daughter 14   Alcohol abuse Other    Heart disease Neg Hx    Esophageal cancer Neg Hx    Rectal cancer Neg Hx    Stomach cancer Neg Hx     Social History: Reviewed social history from progress note on 03/25/2019. 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  Tobacco Use   Smoking status: Never   Smokeless tobacco: Never  Vaping Use   Vaping Use: Never used  Substance and Sexual Activity   Alcohol use: No   Drug use: No   Sexual activity: Not Currently  Other Topics Concern   Not on file  Social History Narrative   No living will   Requests daughter Natalie [name] as health care POA   Would accept resuscitation but no prolonged machines  Not sure about tube feeds--but probably doesn't want      Right handed      Completed HS   Social Determinants of Health   Financial Resource Strain: Low Risk  (03/25/2019)   Overall Financial Resource Strain (CARDIA)    Difficulty of Paying Living Expenses: Not very hard  Food Insecurity: Food Insecurity Present (03/25/2019)   Hunger Vital Sign    Worried About Running Out of Food in the Last Year: Sometimes true    Ran Out of Food in the Last Year: Sometimes true  Transportation Needs: Unmet Transportation Needs (03/25/2019)   PRAPARE - Hydrologist (Medical): Yes    Lack of Transportation (Non-Medical): Yes  Physical Activity: Inactive (03/25/2019)   Exercise Vital Sign     Days of Exercise per Week: 0 days    Minutes of Exercise per Session: 0 min  Stress: Not on file  Social Connections: Unknown (03/25/2019)   Social Connection and Isolation Panel [NHANES]    Frequency of Communication with Friends and Family: Not on file    Frequency of Social Gatherings with Friends and Family: Not on file    Attends Religious Services: Never    Active Member of [place] or Organizations: No    Attends Music therapist: Never    Marital Status: Never married    Allergies: No Known Allergies  Metabolic Disorder Labs: Lab Results  Component Value Date   HGBA1C 6.0 10/23/2020   Lab Results  Component Value Date   PROLACTIN 21.2 10/23/2020   Lab Results  Component Value Date   CHOL 232 (H) 05/28/2021   TRIG 152.0 (H) 05/28/2021   HDL 52.20 05/28/2021   CHOLHDL 4 05/28/2021   VLDL 30.4 05/28/2021   LDLCALC 149 (H) 05/28/2021   [place] 173 (H) 05/27/2020   Lab Results  Component Value Date   TSH 1.35 10/23/2020   TSH 1.448 03/04/2019    Therapeutic Level Labs: No results found for: "LITHIUM" No results found for: "VALPROATE" No results found for: "CBMZ"  Current Medications: Current Outpatient Medications  Medication Sig Dispense Refill   albuterol (PROAIR HFA) 108 (90 Base) MCG/ACT inhaler Inhale 2 puffs into the lungs every 6 (six) hours as needed for wheezing or shortness of breath. 18 g 1   cetirizine (ZYRTEC) 10 MG tablet Take 10 mg by mouth daily.     ibuprofen (ADVIL) 200 MG tablet Take 200 mg by mouth every 6 (six) hours as needed.     risperiDONE (RISPERDAL) 0.25 MG tablet Take 1 tablet (0.25 mg total) by mouth at bedtime. 90 tablet 1   traZODone (DESYREL) 50 MG tablet Take 0.5-1 tablets (25-50 mg total) by mouth at bedtime as needed for sleep. 90 tablet 1   No current facility-administered medications for this visit.     Musculoskeletal: Strength & Muscle Tone: within normal limits Gait & Station: normal Patient leans:  N/A  Psychiatric Specialty Exam: Review of Systems  Psychiatric/Behavioral: Negative.    All other systems reviewed and are negative.   Blood pressure 99/63, pulse 99, temperature 98.2 F (36.8 C), temperature source Temporal, weight 199 lb 12.8 oz (90.6 kg).Body mass index is 35.96 kg/m.  General Appearance: Casual  Eye Contact:  Good  Speech:  Clear and Coherent  Volume:  Normal  Mood:  Euthymic  Affect:  Congruent  Thought Process:  Goal Directed and Descriptions of Associations: Intact  Orientation:  Full (Time, Place, and Person)  Thought Content: Logical  Suicidal Thoughts:  No  Homicidal Thoughts:  No  Memory:  Immediate;   Fair Recent;   Fair Remote;   Fair  Judgement:  Fair  Insight:  Fair  Psychomotor Activity:  Normal  Concentration:  Concentration: Fair and Attention Span: Fair  Recall:  [employer] of Knowledge: Fair  Language: Fair  Akathisia:  No  Handed:  Right  AIMS (if indicated): done  Assets:  Communication Skills Desire for Improvement Housing Social Support Transportation  ADL's:  Intact  Cognition: WNL  Sleep:  Good   Screenings: [facility] Office Visit from 02/24/2022 in [facility] Office Visit from 12/02/2021 in [facility] Visit from 10/07/2021 in [facility] Visit from 08/10/2021 in [facility] Total Score 0 0 0 0      AUDIT    Flowsheet Row Admission (Discharged) from 11/18/2019 in [facility]  Alcohol Use Disorder Identification Test Final Score (AUDIT) 0      GAD-7    Flowsheet Row Office Visit from 02/24/2022 in [facility]  Total GAD-7 Score 0      PHQ2-9    [place] Visit from 02/24/2022 in [facility] Visit from 12/02/2021 in [facility] Visit from  10/07/2021 in [facility] Visit from 08/10/2021 in [facility] Visit from 05/11/2021 in [facility]  PHQ-2 Total Score 0 0 0 0 0  PHQ-9 Total Score -- 0 0 -- --      [facility] Visit from 02/24/2022 in [facility] from 12/02/2021 in [facility] Office Visit from 08/10/2021 in [facility] No Risk No Risk Error: Question 6 not populated        Assessment and Plan: CELES DEDIC is a 71 year old African-American female, single, currently lives in [place], has a history of cognitive disorder, history of psychosis likely brief psychotic episode was evaluated in office today.  Patient is currently tapering herself off of the risperidone, tolerating it well.  Plan as noted below.  Plan Mild neurocognitive disorder-stable Continue follow-up with neurology. Will benefit from neuropsychological testing in 1 to 2 years if she has any decompensation.  Brief psychotic episode in remission Currently takes risperidone 0.25 mg p.o. nightly every other night.  She could taper it down and use it as needed.  Patient to monitor herself and restart taking the risperidone 0.25 mg every night if she has decompensation.  Also advised to call the clinic for sooner appointment.  Insomnia-stable Trazodone 25-50 mg p.o. nightly as needed OSA noncompliant with CPAP Continue sleep hygiene techniques.  Patient with elevated blood pressure reading in session today, blood pressure was repeated-down trending-within normal limits.  Follow-up in clinic in 5 to 6 months or sooner if needed.   This note was generated in part or whole with voice recognition software. Voice recognition is usually quite accurate but there are transcription errors that can and very often do occur. I apologize for any  typographical errors that were not detected and corrected.    Ursula Alert, MD 02/24/2022, 3:26 PM

## 2022-03-03 ENCOUNTER — Ambulatory Visit: Payer: Medicare Other | Admitting: Psychiatry

## 2022-04-14 ENCOUNTER — Emergency Department
Admission: EM | Admit: 2022-04-14 | Discharge: 2022-04-14 | Disposition: A | Discharge status: Home / Self Care | Payer: Medicare Other | Attending: Emergency Medicine | Admitting: Emergency Medicine

## 2022-04-14 ENCOUNTER — Ambulatory Visit (INDEPENDENT_AMBULATORY_CARE_PROVIDER_SITE_OTHER): Payer: Medicare Other | Admitting: Psychiatry

## 2022-04-14 ENCOUNTER — Ambulatory Visit: Payer: Medicare Other | Admitting: Psychiatry

## 2022-04-14 ENCOUNTER — Other Ambulatory Visit: Payer: Self-pay

## 2022-04-14 ENCOUNTER — Encounter: Payer: Self-pay | Admitting: Psychiatry

## 2022-04-14 VITALS — BP 161/114 | HR 140 | Temp 98.1°F | Wt 193.8 lb

## 2022-04-14 DIAGNOSIS — F29 Unspecified psychosis not due to a substance or known physiological condition: Secondary | ICD-10-CM | POA: Insufficient documentation

## 2022-04-14 DIAGNOSIS — R000 Tachycardia, unspecified: Secondary | ICD-10-CM | POA: Insufficient documentation

## 2022-04-14 DIAGNOSIS — Z79899 Other long term (current) drug therapy: Secondary | ICD-10-CM | POA: Insufficient documentation

## 2022-04-14 DIAGNOSIS — F0390 Unspecified dementia without behavioral disturbance: Secondary | ICD-10-CM | POA: Insufficient documentation

## 2022-04-14 DIAGNOSIS — N390 Urinary tract infection, site not specified: Secondary | ICD-10-CM

## 2022-04-14 DIAGNOSIS — F0670 Mild neurocognitive disorder due to known physiological condition without behavioral disturbance: Secondary | ICD-10-CM

## 2022-04-14 DIAGNOSIS — G4701 Insomnia due to medical condition: Secondary | ICD-10-CM

## 2022-04-14 HISTORY — DX: Unspecified dementia, unspecified severity, without behavioral disturbance, psychotic disturbance, mood disturbance, and anxiety: F03.90

## 2022-04-14 LAB — URINALYSIS, ROUTINE W REFLEX MICROSCOPIC
Bilirubin Urine: NEGATIVE
Glucose, UA: NEGATIVE mg/dL
Ketones, ur: 5 mg/dL — AB
Nitrite: NEGATIVE
Protein, ur: 100 mg/dL — AB
Specific Gravity, Urine: 1.023 (ref 1.005–1.030)
pH: 5 (ref 5.0–8.0)

## 2022-04-14 LAB — URINE DRUG SCREEN, QUALITATIVE (ARMC ONLY)
Amphetamines, Ur Screen: NOT DETECTED
Barbiturates, Ur Screen: NOT DETECTED
Benzodiazepine, Ur Scrn: NOT DETECTED
Cannabinoid 50 Ng, Ur ~~LOC~~: NOT DETECTED
Cocaine Metabolite,Ur ~~LOC~~: NOT DETECTED
MDMA (Ecstasy)Ur Screen: NOT DETECTED
Methadone Scn, Ur: NOT DETECTED
Opiate, Ur Screen: NOT DETECTED
Phencyclidine (PCP) Ur S: NOT DETECTED
Tricyclic, Ur Screen: NOT DETECTED

## 2022-04-14 LAB — COMPREHENSIVE METABOLIC PANEL
ALT: 17 U/L (ref 0–44)
AST: 43 U/L — ABNORMAL HIGH (ref 15–41)
Albumin: 4.2 g/dL (ref 3.5–5.0)
Alkaline Phosphatase: 56 U/L (ref 38–126)
Anion gap: 13 (ref 5–15)
BUN: 10 mg/dL (ref 8–23)
CO2: 22 mmol/L (ref 22–32)
Calcium: 9.6 mg/dL (ref 8.9–10.3)
Chloride: 106 mmol/L (ref 98–111)
Creatinine, Ser: 1.23 mg/dL — ABNORMAL HIGH (ref 0.44–1.00)
GFR, Estimated: 46 mL/min — ABNORMAL LOW (ref >60)
Glucose, Bld: 153 mg/dL — ABNORMAL HIGH (ref 70–99)
Potassium: 3.4 mmol/L — ABNORMAL LOW (ref 3.5–5.1)
Sodium: 141 mmol/L (ref 135–145)
Total Bilirubin: 0.7 mg/dL (ref 0.3–1.2)
Total Protein: 8 g/dL (ref 6.5–8.1)

## 2022-04-14 LAB — CBC
HCT: 48.3 % — ABNORMAL HIGH (ref 36.0–46.0)
Hemoglobin: 15.6 g/dL — ABNORMAL HIGH (ref 12.0–15.0)
MCH: 29.5 pg (ref 26.0–34.0)
MCHC: 32.3 g/dL (ref 30.0–36.0)
MCV: 91.5 fL (ref 80.0–100.0)
Platelets: 301 10*3/uL (ref 150–400)
RBC: 5.28 MIL/uL — ABNORMAL HIGH (ref 3.87–5.11)
RDW: 13.2 % (ref 11.5–15.5)
WBC: 6.3 10*3/uL (ref 4.0–10.5)
nRBC: 0 % (ref 0.0–0.2)

## 2022-04-14 LAB — ACETAMINOPHEN LEVEL: Acetaminophen (Tylenol), Serum: <10 ug/mL — ABNORMAL LOW (ref 10–30)

## 2022-04-14 LAB — TROPONIN I (HIGH SENSITIVITY): Troponin I (High Sensitivity): 9 ng/L (ref <18)

## 2022-04-14 LAB — ETHANOL: Alcohol, Ethyl (B): <10 mg/dL (ref <10)

## 2022-04-14 LAB — TSH: TSH: 1.522 u[IU]/mL (ref 0.350–4.500)

## 2022-04-14 LAB — SALICYLATE LEVEL: Salicylate Lvl: <7 mg/dL — ABNORMAL LOW (ref 7.0–30.0)

## 2022-04-14 MED ORDER — LORAZEPAM 2 MG/ML IJ SOLN
1.0000 mg | Freq: Once | INTRAMUSCULAR | Status: AC
  Administered 2022-04-14: 1 mg via INTRAVENOUS
  Filled 2022-04-14: qty 1

## 2022-04-14 MED ORDER — SODIUM CHLORIDE 0.9 % IV BOLUS
1000.0000 mL | Freq: Once | INTRAVENOUS | Status: AC
  Administered 2022-04-14: 1000 mL via INTRAVENOUS

## 2022-04-14 MED ORDER — DILTIAZEM HCL 25 MG/5ML IV SOLN: 10.0000 mg | Freq: Once | INTRAVENOUS | Status: DC

## 2022-04-14 MED ORDER — CEPHALEXIN 500 MG PO CAPS: 500.0000 mg | ORAL_CAPSULE | Freq: Two times a day (BID) | ORAL | 0 refills | Status: DC

## 2022-04-14 MED ORDER — LORAZEPAM 2 MG/ML IJ SOLN: 1.0000 mg | Freq: Once | INTRAMUSCULAR | Status: DC

## 2022-04-14 MED ORDER — METOPROLOL TARTRATE 5 MG/5ML IV SOLN
5.0000 mg | Freq: Once | INTRAVENOUS | Status: AC
  Administered 2022-04-14: 5 mg via INTRAVENOUS
  Filled 2022-04-14: qty 5

## 2022-04-14 MED ORDER — HALOPERIDOL LACTATE 5 MG/ML IJ SOLN
4.0000 mg | Freq: Once | INTRAMUSCULAR | Status: AC
  Administered 2022-04-14: 4 mg via INTRAVENOUS
  Filled 2022-04-14: qty 1

## 2022-04-14 MED ORDER — SODIUM CHLORIDE 0.9 % IV SOLN
1.0000 g | Freq: Once | INTRAVENOUS | Status: AC
  Administered 2022-04-14: 1 g via INTRAVENOUS
  Filled 2022-04-14: qty 10

## 2022-04-14 NOTE — ED Notes (Signed)
LA sent to lab if needed
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
## 2022-04-14 NOTE — Discharge Instructions (Addendum)
As we discussed please take antibiotic as prescribed for its entire course.  Return to the emergency department for any return of fast heart rate, shortness of breath or any chest pain.  Please follow-up with your psychiatrist.  Return to the emergency department if you find that your psychiatric medications are not helping and that you feel you need additional support.

## 2022-04-14 NOTE — Progress Notes (Signed)
[facility] MD OP Progress Note  04/14/2022 11:04 AM SIGNA [name]  MRN:  [id number]  Chief Complaint:  Chief Complaint  Patient presents with      Follow-up: 71 year old African-American female with history of schizophrenia, mild neurocognitive disorder, presented with worsening psychosis and elevated heart rate.   HPI: Natalie [name] is a 71 year old African-American female, lives in [place], retired, has a history of psychosis, mild neurocognitive disorder, asthma, hypokalemia was evaluated in office today.  Patient's daughter contacted the office reporting patient as ' talking out of her head " and this appointment was scheduled.  Patient as well as daughter participated in the session today.  Throughout the appointment patient appeared to be upset, agitated.  Patient also appeared to be delusional, paranoid, preoccupied with the fact that someone had closed the door at her apartment and she felt locked in.  Patient also reports that she pulled the emergency switch at her aprtment however someone called 'Amanda' at her apartment turned the fire department and police away, so they could not come in to assist.  Patient also reports she has not slept in the past 2 nights.  She reports somebody was trying to break into her apartment and she could hear it through her air-conditioning.  She could also hear people talking through her air-conditioning.    Patient in session was able to tell the date correctly.  Patient however appeared to have attention and concentration problem.  This is a significant change from her presentation over the past several months.  Patient is usually calm, appropriate, drives to the appointments herself and is a good historian.  Per daughter they are worried about patient since she has been paranoid, delusional and appears to be agitated.  Patient in session had a heart rate of 144 initially and then on reevaluation it was 140 after 10 minutes.  Daughter provided a  video recording of patient being paranoid at her apartment.  Daughter is interested in patient being admitted for further evaluation and management.    Visit Diagnosis:    ICD-10-CM   1. Psychosis, unspecified psychosis type ([place])  F29    R/O schizophrenia ( has a remote hx of being diagnosed in the past )    2. Mild neurocognitive disorder due to multiple etiologies  F06.70     3. Insomnia due to medical condition  G47.01     4. Tachycardia  R00.0       Past Psychiatric History: Reviewed past psychiatric history from progress note on 03/25/2019.  Past trials of Invega Sustenna, risperidone.  Past Medical History:  Past Medical History:  Diagnosis Date   Allergy    Arthritis    Asthma    Cataract    GERD (gastroesophageal reflux disease)    pt. denied   Heart murmur    was told years ago   History of shingles 2006   Hyperlipidemia     Past Surgical History:  Procedure Laterality Date   ABDOMINAL HYSTERECTOMY     COLONOSCOPY     POLYPECTOMY     VAGINAL DELIVERY     x2    Family Psychiatric History: Reviewed family psychiatric history from progress note on 03/25/2019.  Family History:  Family History  Problem Relation Age of Onset   Arthritis Mother    Diabetes Mother    Hypertension Mother    Liver cancer Mother    Colon cancer Father 10   Pancreatic cancer Brother    Colon polyps Brother  Breast cancer Daughter 42   Alcohol abuse Other    Heart disease Neg Hx    Esophageal cancer Neg Hx    Rectal cancer Neg Hx    Stomach cancer Neg Hx     Social History: Reviewed social history from progress note on 03/25/2019. 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  Tobacco Use   Smoking status: Never    Smokeless tobacco: Never  Vaping Use   Vaping Use: Never used  Substance and Sexual Activity   Alcohol use: No   Drug use: No   Sexual activity: Not Currently  Other Topics Concern   Not on file  Social History Narrative   No living will   Requests daughter [name] as health care [place]   Would accept resuscitation but no prolonged machines   Not sure about tube feeds--but probably doesn't want      Right handed      Completed HS   Social Determinants of Health   Financial Resource Strain: Low Risk  (03/25/2019)   Overall Financial Resource Strain (CARDIA)    Difficulty of Paying Living Expenses: Not very hard  Food Insecurity: Food Insecurity Present (03/25/2019)   Hunger Vital Sign    Worried About Running Out of Food in the Last Year: Sometimes true    Ran Out of Food in the Last Year: Sometimes true  Transportation Needs: Unmet Transportation Needs (03/25/2019)   PRAPARE - Hydrologist (Medical): Yes    Lack of Transportation (Non-Medical): Yes  Physical Activity: Inactive (03/25/2019)   Exercise Vital Sign    Days of Exercise per Week: 0 days    Minutes of Exercise per Session: 0 min  Stress: Not on file  Social Connections: Unknown (03/25/2019)   Social Connection and Isolation Panel [NHANES]    Frequency of Communication with Friends and Family: Not on file    Frequency of Social Gatherings with Friends and Family: Not on file    Attends Religious Services: Never    Active Member of [place] or Organizations: No    Attends Archivist Meetings: Never    Marital Status: Never married    Allergies: No Known Allergies  Metabolic Disorder Labs: Lab Results  Component Value Date   HGBA1C 6.0 10/23/2020   Lab Results  Component Value Date   PROLACTIN 21.2 10/23/2020   Lab Results  Component Value Date   CHOL 232 (H) 05/28/2021   TRIG 152.0 (H) 05/28/2021   HDL 52.20 05/28/2021   CHOLHDL 4 05/28/2021   VLDL 30.4 05/28/2021    LDLCALC 149 (H) 05/28/2021   [place] 173 (H) 05/27/2020   Lab Results  Component Value Date   TSH 1.35 10/23/2020   TSH 1.448 03/04/2019    Therapeutic Level Labs: No results found for: "LITHIUM" No results found for: "VALPROATE" No results found for: "CBMZ"  Current Medications: Current Outpatient Medications  Medication Sig Dispense Refill   albuterol (PROAIR HFA) 108 (90 Base) MCG/ACT inhaler Inhale 2 puffs into the lungs every 6 (six) hours as needed for wheezing or shortness of breath. 18 g 1   cetirizine (ZYRTEC) 10 MG tablet Take 10 mg by mouth daily.  ibuprofen (ADVIL) 200 MG tablet Take 200 mg by mouth every 6 (six) hours as needed.     risperiDONE (RISPERDAL) 0.25 MG tablet Take 1 tablet (0.25 mg total) by mouth at bedtime as needed. Tapering off 90 tablet 1   traZODone (DESYREL) 50 MG tablet Take 0.5-1 tablets (25-50 mg total) by mouth at bedtime as needed for sleep. 90 tablet 1   No current facility-administered medications for this visit.     Musculoskeletal: Strength & Muscle Tone: within normal limits Gait & Station: normal Patient leans: N/A  Psychiatric Specialty Exam: Review of Systems  Unable to perform ROS: Acuity of condition    Blood pressure (!) 161/114, pulse (!) 140, temperature 98.1 F (36.7 C), temperature source Temporal, weight 193 lb 12.8 oz (87.9 kg).Body mass index is 34.88 kg/m.  General Appearance: Guarded  Eye Contact:  Minimal  Speech:  Clear and Coherent  Volume:  Increased  Mood:  Anxious and Irritable  Affect:  Congruent  Thought Process:  Linear and Descriptions of Associations: Circumstantial  Orientation:  Other:  person, time  Thought Content: Delusions, Hallucinations: Auditory, and Paranoid Ideation   Suicidal Thoughts:   did not express any  however patient is paranoid and delusional, feels people are trying to break into her home and is worried about her safety.  Homicidal Thoughts:   denied  Memory:  Immediate;    Fair Recent;   limited, remote - limited  Judgement:  Poor  Insight:  Shallow  Psychomotor Activity:  Increased  Concentration:  Concentration: Poor and Attention Span: Poor  Recall:  Poor  Fund of Knowledge: Fair  Language: Fair  Akathisia:  No  Handed:  Right  AIMS (if indicated): not done  Assets:  Desire for Improvement Social Support Transportation  ADL's:  Intact  Cognition: Impaired,  Mild  Sleep:  Poor   Screenings: [facility] Office Visit from 02/24/2022 in [facility] Office Visit from 12/02/2021 in [facility] Office Visit from 10/07/2021 in [facility] Visit from 08/10/2021 in [facility] Total Score 0 0 0 0      AUDIT    Flowsheet Row Admission (Discharged) from 11/18/2019 in [facility]  Alcohol Use Disorder Identification Test Final Score (AUDIT) 0      GAD-7    Flowsheet Row Office Visit from 02/24/2022 in [facility]  Total GAD-7 Score 0      PHQ2-9    [place] Visit from 02/24/2022 in [facility] Visit from 12/02/2021 in [facility] from 10/07/2021 in [facility] from 08/10/2021 in [facility] Visit from 05/11/2021 in [facility]  PHQ-2 Total Score 0 0 0 0 0  PHQ-9 Total Score -- 0 0 -- --      [place] Visit from 02/24/2022 in [facility] from 12/02/2021 in [facility] Office Visit from 08/10/2021 in [facility] No Risk No Risk Error: Question 6 not populated        Assessment and Plan: Natalie [name] is a 71 year old African-American female,  single, currently lives in [place], has a history of psychosis (remote history of being diagnosed with schizophrenia in the past), cognitive disorder, was evaluated in office today.  Patient was recently being tapered off of  her risperidone since she was doing well however returns to the clinic today with delusions, paranoia and insomnia.  Plan as noted below.  Plan  Psychosis unspecified-rule out schizophrenia-unstable Patient will benefit from being restarted on risperidone however since patient appears to be psychotic, family concerned about safety-will direct her to the emergency department.  Patient also with elevated heart rate in session today 140 x 2. Patient also with elevated blood pressure reading. Patient will benefit from routine labs, urinalysis, CBC with differential, CMP.  She will also benefit from an EKG. Patient may benefit from medical clearance, possibly evaluation for admission.   Insomnia-unstable She has not slept in the past 2 nights.  She will need stabilization. Will consider increasing trazodone, currently prescribed as 25-50 mg at bedtime as needed.  Collateral information obtained from daughter who was present in session today.  Daughter provided a video recording of how patient was acting at her apartment, paranoid."  Patient escorted to the emergency department by staff.   Follow-up in clinic as needed.  This note was generated in part or whole with voice recognition software. Voice recognition is usually quite accurate but there are transcription errors that can and very often do occur. I apologize for any typographical errors that were not detected and corrected.     Ursula Alert, MD 04/14/2022, 11:04 AM

## 2022-04-14 NOTE — ED Notes (Signed)
Pt alert, IV removed, pt given discharge instructions, pt ambulates with assistance but was assisted to vehicle in wheelchair by RN.

## 2022-04-14 NOTE — ED Notes (Signed)
Pt out of bed and at nurses station requesting to use bathroom. This RN trying to redirect pt back to room when she became very unstable. Pt assisted back in bed at this time and purewick placed on pt. PA at bedside. New set of vitals taken

## 2022-04-14 NOTE — ED Provider Notes (Signed)
[facility] Provider Note    Event Date/Time   First MD Initiated Contact with Patient 04/14/22 1153     (approximate)  History   Chief Complaint: Tachycardia  HPI  Natalie [name] is a 71 y.o. female with a past medical history of dementia, hyperlipidemia, psychosis, presents to the emergency department for evaluation of tachycardia.  According to the daughter who helps care for the patient, she has been experiencing increased psychosis which she describes as thinking people are after her.  Daughter states this has been an ongoing issue with the patient she sees a psychiatrist.  Daughter brought the patient to the psychiatrist today who increased the patient's respite all which the patient had only been taking every other day which has now been increased in dosage as well as every day dosing.  However during that evaluation patient was noted to be tachycardic around 140 bpm so they sent the patient to the emergency department for evaluation.  Here the patient is awake alert no distress.  Patient does appear somewhat anxious she continues to watch the cardiac monitor throughout my evaluation.  Daughter states the patient has not slept in approximately 2 days but is hopeful that the new medication that the psychiatrist started will help that as well.  Physical Exam   Triage Vital Signs: ED Triage Vitals [04/14/22 1118]  Enc Vitals Group     BP (!) 162/78     Pulse Rate (!) 138     Resp 17     Temp 99 F (37.2 C)     Temp Source Oral     SpO2 95 %     Weight      Height      Head Circumference      Peak Flow      Pain Score 0     Pain Loc      Pain Edu?      Excl. in [place]?     Most recent vital signs: Vitals:   04/14/22 1118  BP: (!) 162/78  Pulse: (!) 138  Resp: 17  Temp: 99 F (37.2 C)  SpO2: 95%    General: Awake, no distress.  CV:  Good peripheral perfusion.  Regular rhythm rate around 130 bpm.  No obvious murmur. Resp:  Normal effort.   Equal breath sounds bilaterally.  Abd:  No distention.  Soft, nontender.  No rebound or guarding.    ED Results / Procedures / Treatments   EKG  EKG viewed and interpreted by myself appear to show sinus tachycardia 148 bpm with a narrow QRS, normal axis, normal intervals, nonspecific but no concerning ST changes.   MEDICATIONS ORDERED IN ED: Medications  metoprolol tartrate (LOPRESSOR) injection 5 mg (has no administration in time range)  LORazepam (ATIVAN) injection 1 mg (has no administration in time range)  sodium chloride 0.9 % bolus 1,000 mL (1,000 mLs Intravenous Bolus 04/14/22 1144)  LORazepam (ATIVAN) injection 1 mg (1 mg Intravenous Given 04/14/22 1243)     IMPRESSION / MDM / ASSESSMENT AND PLAN / ED COURSE  I reviewed the triage vital signs and the nursing notes.  Patient's presentation is most consistent with acute presentation with potential threat to life or bodily function.  Patient presents emergency department for tachycardia.  Patient has a history of psychosis at times, states they went to the psychiatrist today her Risperdal dose was increased and she was told she needs to be taking this medication every day where she is  currently taking every other day and the daughter doubts that she is even been doing that.  Patient is tachycardic around 140 bpm upon arrival.  Patient is quite anxious appearing, continues to walk to the monitor.  Daughter states the patient has not slept well in 2 nights.  Daughter states she does not want the patient committed as she has a Lexicographer and they have just adjusted her medications.  Daughter states she will stay with the patient for the next couple days to ensure that she is improving.  As far as the patient's tachycardia her work-up is reassuring, denies any shortness of breath or chest pain.  Lab work is reassuring including a negative troponin, normal TSH, reassuring CBC, reassuring chemistry.  Patient's urinalysis has resulted  positive for urinary tract infection with many bacteria.  Urine culture has been sent and patient has been given a 1 g dose of Rocephin as a precaution.  Daughter remains adamant about the patient being discharged home and will be watching the patient.  Patient was given a small dose of metoprolol as well as Haldol.  She is now much more calm, heart rate is down to upper 90s during my evaluation in the room.  As the patient states she feels well has no other chest symptoms the daughter wants to watch her at home I believe the patient will be safe for discharge home into the daughter's care.  She will continue to closely monitor the patient if the patient were to deteriorate from a psychiatric standpoint or develop chest pain shortness of breath they are to return to the emergency department.  FINAL CLINICAL IMPRESSION(S) / ED DIAGNOSES   Urinary tract infection Tachycardia Psychosis  Note:  This document was prepared using Dragon voice recognition software and may include unintentional dictation errors.   Harvest Dark, MD 04/14/22 1512

## 2022-04-15 ENCOUNTER — Telehealth: Payer: Self-pay | Admitting: Psychiatry

## 2022-04-15 ENCOUNTER — Telehealth: Payer: Self-pay

## 2022-04-15 DIAGNOSIS — F29 Unspecified psychosis not due to a substance or known physiological condition: Secondary | ICD-10-CM

## 2022-04-15 MED ORDER — RISPERIDONE 0.25 MG PO TABS: 0.2500 mg | ORAL_TABLET | Freq: Every day | ORAL | 0 refills | Status: DC

## 2022-04-15 NOTE — Telephone Encounter (Signed)
I have sent risperidone to patient's pharmacy.  Patient currently has a UTI which could have contributed to her psychosis, confusion as well as sleep problems.  Patient was sent to the emergency department for treatment.  Will have staff contact patient to schedule an appointment for follow-up.

## 2022-04-15 NOTE — Telephone Encounter (Signed)
Left a message for pt's daughter to check on the pt after her recent ER visit. Dr Silvio Pate and stated he may need to see her before her scheduled AWV next month if she was not doing any better.

## 2022-04-16 LAB — URINE CULTURE

## 2022-04-20 ENCOUNTER — Telehealth: Payer: Self-pay

## 2022-04-20 NOTE — Progress Notes (Signed)
Chronic Care Management Pharmacy Assistant   Name: Natalie [name]  MRN: [id number] DOB: 02/15/1951  Reason for Encounter: CCM (Hosptial Follow Up)  Medications: Outpatient Encounter Medications as of 04/20/2022  Medication Sig   albuterol (PROAIR HFA) 108 (90 Base) MCG/ACT inhaler Inhale 2 puffs into the lungs every 6 (six) hours as needed for wheezing or shortness of breath.   cephALEXin (KEFLEX) 500 MG capsule Take 1 capsule (500 mg total) by mouth 2 (two) times daily.   cetirizine (ZYRTEC) 10 MG tablet Take 10 mg by mouth daily.   ibuprofen (ADVIL) 200 MG tablet Take 200 mg by mouth every 6 (six) hours as needed.   risperiDONE (RISPERDAL) 0.25 MG tablet Take 1 tablet (0.25 mg total) by mouth at bedtime.   traZODone (DESYREL) 50 MG tablet Take 0.5-1 tablets (25-50 mg total) by mouth at bedtime as needed for sleep.   No facility-administered encounter medications on file as of 04/20/2022.    Reviewed hospital notes for details of recent visit. Patient has been contacted by Transitions of Care team: No  Admitted to the ED on 04/14/2022. Discharge date was 04/14/2022.  Discharged from [facility].   Discharge diagnosis (Principal Problem): Urinary tract infection       Tachycardia       Psychosis   Patient was discharged to Home  Brief summary of hospital course: Patient presents emergency department for tachycardia.  Patient has a history of psychosis at times, states they went to the psychiatrist today her Risperdal dose was increased and she was told she needs to be taking this medication every day where she is currently taking every other day and the daughter doubts that she is even been doing that.  Patient is tachycardic around 140 bpm upon arrival.  Patient is quite anxious appearing, continues to walk to the monitor.  Daughter states the patient has not slept well in 2 nights.  Daughter states she does not want the patient committed as she has a Barista and they have just adjusted her medications.  Daughter states she will stay with the patient for the next couple days to ensure that she is improving.  As far as the patient's tachycardia her work-up is reassuring, denies any shortness of breath or chest pain.  Lab work is reassuring including a negative troponin, normal TSH, reassuring CBC, reassuring chemistry.  Patient's urinalysis has resulted positive for urinary tract infection with many bacteria.  Urine culture has been sent and patient has been given a 1 g dose of Rocephin as a precaution.  Daughter remains adamant about the patient being discharged home and will be watching the patient.  Patient was given a small dose of metoprolol as well as Haldol.  She is now much more calm, heart rate is down to upper 90s during my evaluation in the room.  As the patient states she feels well has no other chest symptoms the daughter wants to watch her at home I believe the patient will be safe for discharge home into the daughter's care.  She will continue to closely monitor the patient if the patient were to deteriorate from a psychiatric standpoint or develop chest pain shortness of breath they are to return to the emergency department.  New?Medications Started at [facility] Discharge:?? -Started cephALEXin (KEFLEX) 500 MG capsule   Medication Changes at Hospital Discharge: No changes  Medications Discontinued at Hospital Discharge: No medications stopped  Medications that remain the same after Hospital Discharge:??  -All  other medications will remain the same.    Next CCM appt: Non-CCM  Other upcoming appts: PCP appointment on 06/06/22 for Physical  Charlene Brooke, PharmD notified and will determine if action is needed.  Charlene Brooke, CPP notified  Marijean Niemann, [place] Clinical Pharmacy Assistant [phone]

## 2022-05-09 ENCOUNTER — Telehealth: Payer: Self-pay

## 2022-05-09 NOTE — Telephone Encounter (Signed)
pt daughter called left message that they need a rx for the risperdal that was increased by the hospital `

## 2022-05-09 NOTE — Telephone Encounter (Signed)
Noted  

## 2022-05-09 NOTE — Telephone Encounter (Signed)
  left message to call office back with more info. I don't see any er note that states they changed medication. and that the patient was sent a 90 day supply to the [facility] in [place] for the risperidone .'25mg'$  oon  04-15-22 @ 2:45     Disp Refills Start End   risperiDONE (RISPERDAL) 0.25 MG tablet 90 tablet 0 04/15/2022    Sig - Route: Take 1 tablet (0.25 mg total) by mouth at bedtime. - Oral   Sent to pharmacy as: risperiDONE (RISPERDAL) 0.25 MG tablet   E-Prescribing Status: Receipt confirmed by pharmacy (04/15/2022  2:45 PM EDT)

## 2022-05-10 ENCOUNTER — Ambulatory Visit (INDEPENDENT_AMBULATORY_CARE_PROVIDER_SITE_OTHER): Payer: Medicare Other | Admitting: Psychiatry

## 2022-05-10 ENCOUNTER — Encounter: Payer: Self-pay | Admitting: Psychiatry

## 2022-05-10 VITALS — BP 149/85 | HR 81 | Temp 97.6°F | Ht 62.0 in | Wt 194.0 lb

## 2022-05-10 DIAGNOSIS — F0670 Mild neurocognitive disorder due to known physiological condition without behavioral disturbance: Secondary | ICD-10-CM | POA: Diagnosis not present

## 2022-05-10 DIAGNOSIS — Z79899 Other long term (current) drug therapy: Secondary | ICD-10-CM

## 2022-05-10 DIAGNOSIS — F29 Unspecified psychosis not due to a substance or known physiological condition: Secondary | ICD-10-CM

## 2022-05-10 DIAGNOSIS — G4701 Insomnia due to medical condition: Secondary | ICD-10-CM

## 2022-05-10 MED ORDER — RISPERIDONE 0.5 MG PO TABS: 0.5000 mg | ORAL_TABLET | Freq: Every day | ORAL | 0 refills | Status: DC

## 2022-05-10 NOTE — Patient Instructions (Signed)
Labs needed-  lipid panel, hemoglobin A1c, TSH, vitamin B12, vitamin D, CBC with differential, CMP.

## 2022-05-10 NOTE — Progress Notes (Unsigned)
[facility] MD OP Progress Note  05/10/2022 4:22 PM Natalie [name]  MRN:  [id number]  Chief Complaint:  Chief Complaint  Patient presents with   Follow-up   Paranoid   HPI: Natalie [name] is a 71 year old African-American female, lives in [place], retired, has a history of psychosis, neurocognitive disorder, asthma, hypokalemia was evaluated in office today.  Patient with recent urinary tract infection, possible delirium secondary to the same which likely attribute it to sleep problems, psychosis, was sent to the emergency department on 04/14/2022, treated with Keflex, presented today for follow-up.  Patient today appeared to be alert, oriented to person place time situation,, and pleasant in session today.  This is a much different presentation from her last visit on 04/14/2022 when she was agitated, confused, paranoid and had sleep issues.  Patient today reports she is sleeping better.  Patient denied any hallucinations.  However daughter who was present in session reported she continues to be paranoid and refuses to go back home.  She is currently staying with her daughter in [place].  Daughter reports that is a concern.  Patient today appeared to be alert, oriented to person place time and situation.  She was able to spell the word, 'WORLD' forward and backward correctly.  She was able to do calculation-serial sevens.  Patient with immediate memory for 3 words-3 out of 3 and after 5 minutes 3 out of 3.  She was able to draw a clock well with the time as 11:10 o'clock.  Patient currently compliant on the risperidone.  Denies side effects.  Denies any other concerns today.  Visit Diagnosis:    ICD-10-CM   1. Psychosis, unspecified psychosis type ([place])  F29 risperiDONE (RISPERDAL) 0.5 MG tablet   rule out delirium secondary to UTI versus schizophrenia    2. Mild neurocognitive disorder due to multiple etiologies  F06.70     3. Insomnia due to medical condition  G47.01    recent UTI     4. High risk medication use  Z79.899       Past Psychiatric History: I have reviewed past psychiatric history from progress note on 03/25/2019.  Past trials of Invega Sustenna, risperidone.  Past Medical History:  Past Medical History:  Diagnosis Date   Allergy    Arthritis    Asthma    Cataract    Dementia ([place])    GERD (gastroesophageal reflux disease)    pt. denied   Heart murmur    was told years ago   History of shingles 2006   Hyperlipidemia     Past Surgical History:  Procedure Laterality Date   ABDOMINAL HYSTERECTOMY     COLONOSCOPY     POLYPECTOMY     VAGINAL DELIVERY     x2    Family Psychiatric History: I have reviewed family psychiatric history from progress note on 03/25/2019.  Family History:  Family History  Problem Relation Age of Onset   Arthritis Mother    Diabetes Mother    Hypertension Mother    Liver cancer Mother    Colon cancer Father 72   Pancreatic cancer Brother    Colon polyps Brother    Breast cancer Daughter 24   Alcohol abuse Other    Heart disease Neg Hx    Esophageal cancer Neg Hx    Rectal cancer Neg Hx    Stomach cancer Neg Hx     Social History: I have reviewed social history from progress note on 03/25/2019. 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  Tobacco Use   Smoking status: Never   Smokeless tobacco: Never  Vaping Use   Vaping Use: Never used  Substance and Sexual Activity   Alcohol use: No   Drug use: No   Sexual activity: Not Currently  Other Topics Concern   Not on file  Social History Narrative   No living will   Requests daughter [name] as health care [place]   Would accept resuscitation but no prolonged machines   Not sure about tube feeds--but  probably doesn't want      Right handed      Completed HS   Social Determinants of Health   Financial Resource Strain: Low Risk  (03/25/2019)   Overall Financial Resource Strain (CARDIA)    Difficulty of Paying Living Expenses: Not very hard  Food Insecurity: Food Insecurity Present (03/25/2019)   Hunger Vital Sign    Worried About Running Out of Food in the Last Year: Sometimes true    Ran Out of Food in the Last Year: Sometimes true  Transportation Needs: Unmet Transportation Needs (03/25/2019)   PRAPARE - Hydrologist (Medical): Yes    Lack of Transportation (Non-Medical): Yes  Physical Activity: Inactive (03/25/2019)   Exercise Vital Sign    Days of Exercise per Week: 0 days    Minutes of Exercise per Session: 0 min  Stress: Not on file  Social Connections: Unknown (03/25/2019)   Social Connection and Isolation Panel [NHANES]    Frequency of Communication with Friends and Family: Not on file    Frequency of Social Gatherings with Friends and Family: Not on file    Attends Religious Services: Never    Active Member of [place] or Organizations: No    Attends Archivist Meetings: Never    Marital Status: Never married    Allergies: No Known Allergies  Metabolic Disorder Labs: Lab Results  Component Value Date   HGBA1C 6.0 10/23/2020   Lab Results  Component Value Date   PROLACTIN 21.2 10/23/2020   Lab Results  Component Value Date   CHOL 232 (H) 05/28/2021   TRIG 152.0 (H) 05/28/2021   HDL 52.20 05/28/2021   CHOLHDL 4 05/28/2021   VLDL 30.4 05/28/2021   LDLCALC 149 (H) 05/28/2021   [place] 173 (H) 05/27/2020   Lab Results  Component Value Date   TSH 1.522 04/14/2022   TSH 1.35 10/23/2020    Therapeutic Level Labs: No results found for: "LITHIUM" No results found for: "VALPROATE" No results found for: "CBMZ"  Current Medications: Current Outpatient Medications  Medication Sig Dispense Refill   albuterol (PROAIR HFA)  108 (90 Base) MCG/ACT inhaler Inhale 2 puffs into the lungs every 6 (six) hours as needed for wheezing or shortness of breath. 18 g 1   ibuprofen (ADVIL) 200 MG tablet Take 200 mg by mouth every 6 (six) hours as needed.     risperiDONE (RISPERDAL) 0.5 MG tablet Take 1 tablet (0.5 mg total) by mouth at bedtime. 90 tablet 0   traZODone (DESYREL) 50 MG tablet Take 0.5-1 tablets (25-50 mg total) by mouth at bedtime as needed for sleep. 90 tablet 1   cephALEXin (KEFLEX) 500  MG capsule Take 1 capsule (500 mg total) by mouth 2 (two) times daily. (Patient not taking: Reported on 05/10/2022) 20 capsule 0   cetirizine (ZYRTEC) 10 MG tablet Take 10 mg by mouth daily. (Patient not taking: Reported on 05/10/2022)     No current facility-administered medications for this visit.     Musculoskeletal: Strength & Muscle Tone: within normal limits Gait & Station: normal Patient leans: N/A  Psychiatric Specialty Exam: Review of Systems  Psychiatric/Behavioral: Negative.    All other systems reviewed and are negative.   Blood pressure (!) 149/85, pulse 81, temperature 97.6 F (36.4 C), temperature source Temporal, height '5\' 2"'$  (1.575 m), weight 194 lb (88 kg).Body mass index is 35.48 kg/m.  General Appearance: Casual  Eye Contact:  Fair  Speech:  Clear and Coherent  Volume:  Normal  Mood:  Euthymic  Affect:  Appropriate  Thought Process:  Goal Directed and Descriptions of Associations: Intact  Orientation:  Full (Time, Place, and Person)  Thought Content: Paranoid Ideation improving  Suicidal Thoughts:  No  Homicidal Thoughts:  No  Memory:  Immediate;   Fair Recent;   Fair Remote;   Fair  Judgement:  Fair  Insight:  Fair  Psychomotor Activity:  Normal  Concentration:  Concentration: Fair and Attention Span: Fair  Recall:  [employer] of Knowledge: Fair  Language: Fair  Akathisia:  No  Handed:  Right  AIMS (if indicated): done  Assets:  Communication Skills Desire for  [place] Talents/Skills Transportation  ADL's:  Intact  Cognition: WNL  Sleep:  Fair   Screenings: [facility] Office Visit from 05/10/2022 in [facility] Office Visit from 02/24/2022 in [facility] Visit from 12/02/2021 in [facility] from 10/07/2021 in [facility] Visit from 08/10/2021 in [facility] Total Score 0 0 0 0 0      AUDIT    Flowsheet Row Admission (Discharged) from 11/18/2019 in [facility]  Alcohol Use Disorder Identification Test Final Score (AUDIT) 0      GAD-7    [facility] Visit from 05/10/2022 in [facility] Office Visit from 04/14/2022 in [facility] Visit from 02/24/2022 in [facility]  Total GAD-7 Score 1 3 0      PHQ2-9    [facility] Visit from 05/10/2022 in [facility] Visit from 04/14/2022 in [facility] from 02/24/2022 in [facility] from 12/02/2021 in [facility] Visit from 10/07/2021 in [facility]  PHQ-2 Total Score 0 0 0 0 0  PHQ-9 Total Score 1 -- -- 0 0      [facility] Office Visit from 05/10/2022 in [facility] Most recent reading at 05/10/2022  4:03 PM ED from 04/14/2022 in [facility] Most recent reading at 04/14/2022 11:28 AM Office Visit from 04/14/2022 in [facility] Most recent reading at 04/14/2022 11:18 AM  C-SSRS RISK CATEGORY No Risk No Risk No Risk        Assessment and Plan: Natalie [name] is a 71 year old  African-American female, recent psychosis, currently improving, recent diagnosis of UTI which likely could have triggered her psychosis and sleep problems, will benefit from the following plan.  Plan  Psychosis unspecified-rule out  schizophrenia versus delirium likely due to recent UTI-improving Increase risperidone to 0.5 mg p.o. nightly. Continue trazodone 25-50 mg p.o. nightly as needed  Insomnia-improving Discussed sleep hygiene techniques. Discussed to keep the room bright during the day and dark at night and to follow a good sleep schedule.  Advised not to take long naps during the day and if she does to take a short nap to take it before 2:30 PM.  Advised to exercise, stay hydrated during the day and limit the amount of fluids at night.  High risk medication use-patient to get the following labs completed including hemoglobin A1c, lipid panel, TSH, CBC, CMP.  Patient also may benefit from TSH, vitamin B12, vitamin D.  Patient would like to get the labs done at her primary care provider's office since she has upcoming appointment in October.  Patient with elevated blood pressure reading will need to follow up with primary care provider.  Has upcoming appointment.  Will consider repeating EKG at her next visit.  Collateral information was obtained from daughter-[name] who was present in session as noted above.  Follow-up in clinic in 4 to 5 weeks or sooner if needed. This note was generated in part or whole with voice recognition software. Voice recognition is usually quite accurate but there are transcription errors that can and very often do occur. I apologize for any typographical errors that were not detected and corrected.     Ursula Alert, MD 05/11/2022, 8:23 AM

## 2022-05-11 ENCOUNTER — Encounter: Payer: Self-pay | Admitting: Internal Medicine

## 2022-05-11 ENCOUNTER — Ambulatory Visit (INDEPENDENT_AMBULATORY_CARE_PROVIDER_SITE_OTHER): Payer: Medicare Other | Admitting: Internal Medicine

## 2022-05-11 VITALS — BP 110/80 | HR 72 | Temp 97.4°F | Ht 62.5 in | Wt 193.0 lb

## 2022-05-11 DIAGNOSIS — N2581 Secondary hyperparathyroidism of renal origin: Secondary | ICD-10-CM | POA: Diagnosis not present

## 2022-05-11 DIAGNOSIS — F0670 Mild neurocognitive disorder due to known physiological condition without behavioral disturbance: Secondary | ICD-10-CM | POA: Diagnosis not present

## 2022-05-11 DIAGNOSIS — R399 Unspecified symptoms and signs involving the genitourinary system: Secondary | ICD-10-CM | POA: Insufficient documentation

## 2022-05-11 DIAGNOSIS — N1831 Chronic kidney disease, stage 3a: Secondary | ICD-10-CM | POA: Insufficient documentation

## 2022-05-11 LAB — POC URINALSYSI DIPSTICK (AUTOMATED)
Bilirubin, UA: NEGATIVE
Blood, UA: NEGATIVE
Glucose, UA: NEGATIVE
Ketones, UA: NEGATIVE
Leukocytes, UA: NEGATIVE
Nitrite, UA: NEGATIVE
Protein, UA: NEGATIVE
Spec Grav, UA: 1.025 (ref 1.010–1.025)
Urobilinogen, UA: 0.2 E.U./dL
pH, UA: 6 (ref 5.0–8.0)

## 2022-05-11 LAB — HEPATIC FUNCTION PANEL
ALT: 9 U/L (ref 0–35)
AST: 15 U/L (ref 0–37)
Albumin: 4.1 g/dL (ref 3.5–5.2)
Alkaline Phosphatase: 52 U/L (ref 39–117)
Bilirubin, Direct: 0.1 mg/dL (ref 0.0–0.3)
Total Bilirubin: 0.7 mg/dL (ref 0.2–1.2)
Total Protein: 6.9 g/dL (ref 6.0–8.3)

## 2022-05-11 LAB — RENAL FUNCTION PANEL
Albumin: 4.1 g/dL (ref 3.5–5.2)
BUN: 12 mg/dL (ref 6–23)
CO2: 31 mEq/L (ref 19–32)
Calcium: 9.4 mg/dL (ref 8.4–10.5)
Chloride: 103 mEq/L (ref 96–112)
Creatinine, Ser: 0.93 mg/dL (ref 0.40–1.20)
GFR: 61.05 mL/min (ref >60.00)
Glucose, Bld: 98 mg/dL (ref 70–99)
Phosphorus: 3.2 mg/dL (ref 2.3–4.6)
Potassium: 4.2 mEq/L (ref 3.5–5.1)
Sodium: 141 mEq/L (ref 135–145)

## 2022-05-11 LAB — CBC
HCT: 42.7 % (ref 36.0–46.0)
Hemoglobin: 14.2 g/dL (ref 12.0–15.0)
MCHC: 33.2 g/dL (ref 30.0–36.0)
MCV: 91.7 fl (ref 78.0–100.0)
Platelets: 261 10*3/uL (ref 150.0–400.0)
RBC: 4.66 Mil/uL (ref 3.87–5.11)
RDW: 13.8 % (ref 11.5–15.5)
WBC: 3.5 10*3/uL — ABNORMAL LOW (ref 4.0–10.5)

## 2022-05-11 LAB — VITAMIN D 25 HYDROXY (VIT D DEFICIENCY, FRACTURES): VITD: 16.45 ng/mL — ABNORMAL LOW (ref 30.00–100.00)

## 2022-05-11 LAB — TSH: TSH: 0.88 u[IU]/mL (ref 0.35–5.50)

## 2022-05-11 LAB — VITAMIN B12: Vitamin B-12: 304 pg/mL (ref 211–911)

## 2022-05-11 LAB — HEMOGLOBIN A1C: Hgb A1c MFr Bld: 6.1 % (ref 4.6–6.5)

## 2022-05-11 NOTE — Progress Notes (Signed)
Thank you Dr.Letvak , noted .

## 2022-05-11 NOTE — Assessment & Plan Note (Signed)
Still lives independently

## 2022-05-11 NOTE — Assessment & Plan Note (Signed)
I will check labs again to confirm levels

## 2022-05-11 NOTE — Progress Notes (Signed)
Subjective:    Patient ID: Natalie [name], female    DOB: 01/13/1951, 71 y.o.   MRN: [id number]  HPI Here for ER follow up and at request of her psychiatrist  Concern for recheck of diagnosis of UTI In ER a month ago---tachycardia and worsened psychosis. Sent by Dr [name] No urinary symptoms----no dysuria No clear change in urgency No fever  Reviewed other labs---sugar up (but not fasting) GFR was 46  She made med adjustments yesterday---risperidone increased  Current Outpatient Medications on File Prior to Visit  Medication Sig Dispense Refill   albuterol (PROAIR HFA) 108 (90 Base) MCG/ACT inhaler Inhale 2 puffs into the lungs every 6 (six) hours as needed for wheezing or shortness of breath. 18 g 1   cetirizine (ZYRTEC) 10 MG tablet Take 10 mg by mouth daily.     ibuprofen (ADVIL) 200 MG tablet Take 200 mg by mouth every 6 (six) hours as needed.     risperiDONE (RISPERDAL) 0.5 MG tablet Take 1 tablet (0.5 mg total) by mouth at bedtime. 90 tablet 0   traZODone (DESYREL) 50 MG tablet Take 0.5-1 tablets (25-50 mg total) by mouth at bedtime as needed for sleep. 90 tablet 1   No current facility-administered medications on file prior to visit.    No Known Allergies  Past Medical History:  Diagnosis Date   Allergy    Arthritis    Asthma    Cataract    Dementia ([place])    GERD (gastroesophageal reflux disease)    pt. denied   Heart murmur    was told years ago   History of shingles 2006   Hyperlipidemia     Past Surgical History:  Procedure Laterality Date   ABDOMINAL HYSTERECTOMY     COLONOSCOPY     POLYPECTOMY     VAGINAL DELIVERY     x2    Family History  Problem Relation Age of Onset   Arthritis Mother    Diabetes Mother    Hypertension Mother    Liver cancer Mother    Colon cancer Father 38   Pancreatic cancer Brother    Colon polyps Brother    Breast cancer Daughter 71   Alcohol abuse Other    Heart disease Neg Hx    Esophageal cancer Neg Hx     Rectal cancer Neg Hx    Stomach cancer Neg Hx     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  Tobacco Use   Smoking status: Never   Smokeless tobacco: Never  Vaping Use   Vaping Use: Never used  Substance and Sexual Activity   Alcohol use: No   Drug use: No   Sexual activity: Not Currently  Other Topics Concern   Not on file  Social History Narrative   No living will   Requests daughter [name] as health care [place]   Would accept resuscitation but no prolonged machines   Not sure about tube feeds--but probably doesn't want      Right handed      Completed HS   Social Determinants of Health   Financial Resource Strain: Low Risk  (03/25/2019)   Overall Financial Resource Strain (CARDIA)  Difficulty of Paying Living Expenses: Not very hard  Food Insecurity: Food Insecurity Present (03/25/2019)   Hunger Vital Sign    Worried About Running Out of Food in the Last Year: Sometimes true    Ran Out of Food in the Last Year: Sometimes true  Transportation Needs: Unmet Transportation Needs (03/25/2019)   PRAPARE - Hydrologist (Medical): Yes    Lack of Transportation (Non-Medical): Yes  Physical Activity: Inactive (03/25/2019)   Exercise Vital Sign    Days of Exercise per Week: 0 days    Minutes of Exercise per Session: 0 min  Stress: Not on file  Social Connections: Unknown (03/25/2019)   Social Connection and Isolation Panel [NHANES]    Frequency of Communication with Friends and Family: Not on file    Frequency of Social Gatherings with Friends and Family: Not on file    Attends Religious Services: Never    Marine scientist or Organizations: No    Attends Archivist Meetings: Never     Marital Status: Never married  Intimate Partner Violence: Unknown (03/25/2019)   Humiliation, Afraid, Rape, and Kick questionnaire    Fear of Current or Ex-Partner: No    Emotionally Abused: Not asked    Physically Abused: No    Sexually Abused: Not asked   Review of Systems Sleep is variable Appetite is variable--tends to snack instead of a main meal Has lost some weight from 1-2 years ago--not back to her baseline     Objective:   Physical Exam Constitutional:      Appearance: Normal appearance.  Neurological:     Mental Status: She is alert.  Psychiatric:        Mood and Affect: Mood normal.        Behavior: Behavior normal.            Assessment & Plan:

## 2022-05-11 NOTE — Assessment & Plan Note (Signed)
Really no symptoms that were new or worrisome ER urinalysis just showed bacteria but no pyuria Culture negative I disagree with their diagnosis of UTI Urinalysis is normal here No indication of infection--or that it was affecting her mental state No Rx for this

## 2022-05-11 NOTE — Addendum Note (Signed)
Addended by: Pilar Grammes on: 05/11/2022 11:04 AM   Modules accepted: Orders

## 2022-05-12 DIAGNOSIS — N2581 Secondary hyperparathyroidism of renal origin: Secondary | ICD-10-CM | POA: Insufficient documentation

## 2022-05-12 LAB — PARATHYROID HORMONE, INTACT (NO CA): PTH: 111 pg/mL — ABNORMAL HIGH (ref 16–77)

## 2022-05-12 NOTE — Assessment & Plan Note (Signed)
Lab review shows high PTH and low vitamin D Will start OTC vitamin D

## 2022-05-24 ENCOUNTER — Other Ambulatory Visit: Payer: Self-pay | Admitting: Psychiatry

## 2022-05-24 DIAGNOSIS — G4701 Insomnia due to medical condition: Secondary | ICD-10-CM

## 2022-06-03 ENCOUNTER — Encounter: Payer: Medicare Other | Admitting: Internal Medicine

## 2022-06-06 ENCOUNTER — Encounter: Payer: Self-pay | Admitting: Internal Medicine

## 2022-06-06 ENCOUNTER — Ambulatory Visit (INDEPENDENT_AMBULATORY_CARE_PROVIDER_SITE_OTHER): Payer: Medicare Other | Admitting: Internal Medicine

## 2022-06-06 ENCOUNTER — Telehealth: Payer: Self-pay | Admitting: Internal Medicine

## 2022-06-06 VITALS — BP 110/80 | HR 91 | Temp 97.4°F | Ht 62.75 in | Wt 190.0 lb

## 2022-06-06 DIAGNOSIS — Z0000 Encounter for general adult medical examination without abnormal findings: Secondary | ICD-10-CM | POA: Diagnosis not present

## 2022-06-06 DIAGNOSIS — Z23 Encounter for immunization: Secondary | ICD-10-CM | POA: Diagnosis not present

## 2022-06-06 DIAGNOSIS — R0609 Other forms of dyspnea: Secondary | ICD-10-CM

## 2022-06-06 DIAGNOSIS — M150 Primary generalized (osteo)arthritis: Secondary | ICD-10-CM

## 2022-06-06 DIAGNOSIS — N2581 Secondary hyperparathyroidism of renal origin: Secondary | ICD-10-CM

## 2022-06-06 DIAGNOSIS — M159 Polyosteoarthritis, unspecified: Secondary | ICD-10-CM | POA: Diagnosis not present

## 2022-06-06 DIAGNOSIS — F209 Schizophrenia, unspecified: Secondary | ICD-10-CM

## 2022-06-06 NOTE — Progress Notes (Signed)
Subjective:    Patient ID: Natalie [name], female    DOB: 1950/12/12, 71 y.o.   MRN: [id number]  HPI Here for Medicare wellness visit and follow up of chronic health conditions Reviewed advanced directives Reviewed other doctors----[facility], Dr Eappen--psychiatry, Dr Aquino--neurology, Dr [name] No hospitalizations or surgery this year Due for eye exam--?early cataracts Hearing is okay No alcohol or tobacco No exercise No falls No depression or anhedonia Still drives, does her shopping and all instrumental ADLs. Own home No sig memory issues---just recall  Having some problems with her breathing and heart--gets tachycardia going up steps No chest pain Does improve with rest No palpitations when in bed  No recurrence of confusion or mental status changes Hallucinations controlled with the risperidone---was increased again after last spell  GFR was 46--but actually improved last time Is on vitamin D now  Current Outpatient Medications on File Prior to Visit  Medication Sig Dispense Refill   albuterol (PROAIR HFA) 108 (90 Base) MCG/ACT inhaler Inhale 2 puffs into the lungs every 6 (six) hours as needed for wheezing or shortness of breath. 18 g 1   cetirizine (ZYRTEC) 10 MG tablet Take 10 mg by mouth daily.     Cholecalciferol (VITAMIN D3) 1000 units CAPS Take 1 capsule by mouth daily.     ibuprofen (ADVIL) 200 MG tablet Take 200 mg by mouth every 6 (six) hours as needed.     risperiDONE (RISPERDAL) 0.5 MG tablet Take 1 tablet (0.5 mg total) by mouth at bedtime. 90 tablet 0   traZODone (DESYREL) 50 MG tablet TAKE 1/2 TO 1 (ONE-HALF TO ONE) TABLET BY MOUTH AT BEDTIME AS NEEDED FOR SLEEP 90 tablet 0   No current facility-administered medications on file prior to visit.    No Known Allergies  Past Medical History:  Diagnosis Date   Allergy    Arthritis    Asthma    Cataract    Dementia ([place])    GERD (gastroesophageal reflux disease)    pt. denied   Heart  murmur    was told years ago   History of shingles 2006   Hyperlipidemia     Past Surgical History:  Procedure Laterality Date   ABDOMINAL HYSTERECTOMY     COLONOSCOPY     POLYPECTOMY     VAGINAL DELIVERY     x2    Family History  Problem Relation Age of Onset   Arthritis Mother    Diabetes Mother    Hypertension Mother    Liver cancer Mother    Colon cancer Father 4   Pancreatic cancer Brother    Colon polyps Brother    Breast cancer Daughter 73   Alcohol abuse Other    Heart disease Neg Hx    Esophageal cancer Neg Hx    Rectal cancer Neg Hx    Stomach cancer Neg Hx     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  Tobacco Use   Smoking status: Never   Smokeless tobacco: Never  Vaping Use   Vaping Use: Never used  Substance and Sexual Activity   Alcohol use: No   Drug use: No   Sexual activity: Not Currently  Other Topics  Concern   Not on file  Social History Narrative   No living will   Requests daughter [name] as health care [place]   Would accept resuscitation but no prolonged machines   Not sure about tube feeds--but probably doesn't want      Right handed      Completed HS   Social Determinants of Health   Financial Resource Strain: Low Risk  (03/25/2019)   Overall Financial Resource Strain (CARDIA)    Difficulty of Paying Living Expenses: Not very hard  Food Insecurity: Food Insecurity Present (03/25/2019)   Hunger Vital Sign    Worried About Running Out of Food in the Last Year: Sometimes true    Ran Out of Food in the Last Year: Sometimes true  Transportation Needs: Unmet Transportation Needs (03/25/2019)   PRAPARE - Transportation    Lack of Transportation (Medical): Yes    Lack of  Transportation (Non-Medical): Yes  Physical Activity: Inactive (03/25/2019)   Exercise Vital Sign    Days of Exercise per Week: 0 days    Minutes of Exercise per Session: 0 min  Stress: Not on file  Social Connections: Unknown (03/25/2019)   Social Connection and Isolation Panel [NHANES]    Frequency of Communication with Friends and Family: Not on file    Frequency of Social Gatherings with Friends and Family: Not on file    Attends Religious Services: Never    Marine scientist or Organizations: No    Attends Archivist Meetings: Never    Marital Status: Never married  Intimate Partner Violence: Unknown (03/25/2019)   Humiliation, Afraid, Rape, and Kick questionnaire    Fear of Current or Ex-Partner: No    Emotionally Abused: Not asked    Physically Abused: No    Sexually Abused: Not asked   Review of Systems Appetite is okay Weight is stable Sleep is variable--uses the trazodone '25mg'$  Wears seat belt Teeth okay---keeps up with dentist (due) No heartburn or dysphagia Bowels slow at times---no meds. No blood Voids okay--some urgency. No incontinence  Some left knee pain---uses advil prn    Objective:   Physical Exam Constitutional:      Appearance: Normal appearance.  HENT:     Mouth/Throat:     Comments: No lesions Eyes:     Conjunctiva/sclera: Conjunctivae normal.     Pupils: Pupils are equal, round, and reactive to light.  Cardiovascular:     Rate and Rhythm: Normal rate and regular rhythm.     Pulses: Normal pulses.     Heart sounds: No murmur heard.    No gallop.  Abdominal:     Palpations: Abdomen is soft.     Tenderness: There is no abdominal tenderness.  Musculoskeletal:     Cervical back: Neck supple.     Right lower leg: No edema.     Left lower leg: No edema.  Lymphadenopathy:     Cervical: No cervical adenopathy.  Skin:    Findings: No lesion or rash.  Neurological:     General: No focal deficit present.     Mental Status: She is  alert and oriented to person, place, and time.     Comments: Mini--cog normal  Psychiatric:        Mood and Affect: Mood normal.        Behavior: Behavior normal.            Assessment & Plan:

## 2022-06-06 NOTE — Telephone Encounter (Signed)
Patient daughter called to make sure she gets a Chiropodist filled out.

## 2022-06-06 NOTE — Assessment & Plan Note (Signed)
Now on vitamin D

## 2022-06-06 NOTE — Telephone Encounter (Signed)
I am just seeing this message. She did not ask me for one. Will see if Dr Silvio Pate did one for her.

## 2022-06-06 NOTE — Assessment & Plan Note (Signed)
And poor heart rate response to exercise (palpitaitons/tachycardia) No symptoms at rest Suspicious for CAD--will set up with cardiology

## 2022-06-06 NOTE — Assessment & Plan Note (Signed)
No thought process disturbance now--back on higher risperidone

## 2022-06-06 NOTE — Assessment & Plan Note (Signed)
Discussed trying tylenol instead of the advil

## 2022-06-06 NOTE — Telephone Encounter (Signed)
Spoke to daughter. Pt has been having to use her albuterol inhaler more. She thinks asthma should be enough to allow her to get a placard.

## 2022-06-06 NOTE — Addendum Note (Signed)
Addended by: Pilar Grammes on: 06/06/2022 10:54 AM   Modules accepted: Orders

## 2022-06-06 NOTE — Progress Notes (Signed)
Vision Screening   Right eye Left eye Both eyes  Without correction '20/25 20/50 20/25 '$  With correction     Hearing Screening - Comments:: Passed whisper test

## 2022-06-06 NOTE — Assessment & Plan Note (Addendum)
I have personally reviewed the Medicare Annual Wellness questionnaire and have noted 1. The patient's medical and social history 2. Their use of alcohol, tobacco or illicit drugs 3. Their current medications and supplements 4. The patient's functional ability including ADL's, fall risks, home safety risks and hearing or visual             impairment. 5. Diet and physical activities 6. Evidence for depression or mood disorders  The patients weight, height, BMI and visual acuity have been recorded in the chart I have made referrals, counseling and provided education to the patient based review of the above and I have provided the pt with a written personalized care plan for preventive services.  I have provided you with a copy of your personalized plan for preventive services. Please take the time to review along with your updated medication list.  Last colon due 2024 Yearly mammogram--at least to 73 No pap due to age Discussed exercise---but will proceed with cardiology eval first Flu vaccine today COVID vaccine Td at pharmacy Consider shingrix

## 2022-06-07 NOTE — Telephone Encounter (Signed)
Spoke to pt. Let her know that the form is upfront ready to pickup.

## 2022-06-10 NOTE — Telephone Encounter (Signed)
Pt called stating Dr. Silvio Pate didn't sign 2 places on the handicap form. Pt was asking If she brings the form back could complete the rest of the form? Call back # is [phone]

## 2022-06-10 NOTE — Telephone Encounter (Signed)
Spoke to pt. She said he did not write her name or put the date. I advised her that she can write in her full legal name as it reads on her License or ID and put today's date. There was only 1 place for Dr Silvio Pate to sign and he signed it.

## 2022-06-14 ENCOUNTER — Other Ambulatory Visit: Payer: Self-pay | Admitting: Internal Medicine

## 2022-06-14 DIAGNOSIS — Z1231 Encounter for screening mammogram for malignant neoplasm of breast: Secondary | ICD-10-CM

## 2022-06-22 ENCOUNTER — Ambulatory Visit (INDEPENDENT_AMBULATORY_CARE_PROVIDER_SITE_OTHER): Payer: Medicare Other | Admitting: Psychiatry

## 2022-06-22 ENCOUNTER — Encounter: Payer: Self-pay | Admitting: Psychiatry

## 2022-06-22 VITALS — BP 151/89 | HR 78 | Temp 99.2°F | Wt 193.0 lb

## 2022-06-22 DIAGNOSIS — F28 Other psychotic disorder not due to a substance or known physiological condition: Secondary | ICD-10-CM

## 2022-06-22 DIAGNOSIS — F05 Delirium due to known physiological condition: Secondary | ICD-10-CM | POA: Insufficient documentation

## 2022-06-22 DIAGNOSIS — F0670 Mild neurocognitive disorder due to known physiological condition without behavioral disturbance: Secondary | ICD-10-CM | POA: Diagnosis not present

## 2022-06-22 DIAGNOSIS — G4701 Insomnia due to medical condition: Secondary | ICD-10-CM

## 2022-06-22 NOTE — Progress Notes (Unsigned)
[facility] MD OP Progress Note  06/23/2022 12:26 PM JLA REYNOLDS  MRN:  [id number]  Chief Complaint:  Chief Complaint  Patient presents with   Follow-up   Insomnia   Paranoid   Medication Refill   HPI: Natalie [name] is a 71 year old African American female, lives in [place], retired, has a history of psychosis unspecified, neurocognitive disorder, recent UTI, was evaluated in office today.  Patient with recent onset of confusion, sleep problems, psychosis in the form of paranoia as well as auditory hallucinations which she could not make out-with the emergency department visit on 04/14/2022 where she was diagnosed with UTI and was treated with antibiotic.  Patient was restarted on risperidone due to psychosis.  Psychosis resolved with risperidone low dosage of 0.5 mg.  Currently denies any UTI symptoms and reports likely it has resolved as well.  Patient today appeared to be pleasant, alert, cooperative, was able to answer questions appropriately.  Denies any paranoia or perceptual disturbances at this time.  Reports sleep has improved although some nights sleep continues to be restless.  She however has been taking only 1/2 tablet of the trazodone 50 mg, agreeable to increase the dosage.  Patient denies any significant depression or anxiety.  Currently lives with her daughter, plans to stay with her until the holidays are over.  Plans to spend Thanksgiving with her sister.  She does have a lot of family members around her.  Reports she is planning to find work so she can at least work part-time.  Denies any suicidality, homicidality.  Patient was able to complete SLUMS in session today -scored high-28 out of 30.  Denies any side effects to medications.  Denies any concerns.  Visit Diagnosis:    ICD-10-CM   1. 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 Mild neurocognitive disorder due to multiple etiologies  F06.70     3. Insomnia due to medical condition  G47.01    mood      Past Psychiatric History: Reviewed past psychiatric history from progress note on 03/25/2019.  Past trials of medications like Invega Sustenna, risperidone.  Past Medical History:  Past Medical History:  Diagnosis Date   Allergy    Arthritis    Asthma    Cataract    Dementia ([place])    GERD (gastroesophageal reflux disease)    pt. denied   Heart murmur    was told years ago   History of shingles 2006   Hyperlipidemia     Past Surgical History:  Procedure Laterality Date   ABDOMINAL HYSTERECTOMY     COLONOSCOPY     POLYPECTOMY     VAGINAL DELIVERY     x2    Family Psychiatric History: Reviewed family psychiatric history from progress note on 03/25/2019.  Family History:  Family History  Problem Relation Age of Onset   Arthritis Mother    Diabetes Mother    Hypertension Mother    Liver cancer Mother    Colon cancer Father 47   Pancreatic cancer Brother    Colon polyps Brother    Breast cancer Daughter 42   Alcohol abuse Other    Heart disease Neg Hx    Esophageal cancer Neg Hx    Rectal cancer Neg Hx    Stomach cancer Neg Hx     Social History: Reviewed social history  from progress note on 03/25/2019. 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  Tobacco Use   Smoking status: Never   Smokeless tobacco: Never  Vaping Use   Vaping Use: Never used  Substance and Sexual Activity   Alcohol use: No   Drug use: No   Sexual activity: Not Currently  Other Topics Concern   Not on file  Social History Narrative   No living will   Requests daughter  [name] as health care [place]   Would accept resuscitation but no prolonged machines   No prolonged tube feeds      Right handed      Completed HS   Social Determinants of Health   Financial Resource Strain: Low Risk  (03/25/2019)   Overall Financial Resource Strain (CARDIA)    Difficulty of Paying Living Expenses: Not very hard  Food Insecurity: Food Insecurity Present (03/25/2019)   Hunger Vital Sign    Worried About Running Out of Food in the Last Year: Sometimes true    Ran Out of Food in the Last Year: Sometimes true  Transportation Needs: Unmet Transportation Needs (03/25/2019)   PRAPARE - Hydrologist (Medical): Yes    Lack of Transportation (Non-Medical): Yes  Physical Activity: Inactive (03/25/2019)   Exercise Vital Sign    Days of Exercise per Week: 0 days    Minutes of Exercise per Session: 0 min  Stress: Not on file  Social Connections: Unknown (03/25/2019)   Social Connection and Isolation Panel [NHANES]    Frequency of Communication with Friends and Family: Not on file    Frequency of Social Gatherings with Friends and Family: Not on file    Attends Religious Services: Never    Active Member of [employer] or Organizations: No    Attends Archivist Meetings: Never    Marital Status: Never married    Allergies: No Known Allergies  Metabolic Disorder Labs: Lab Results  Component Value Date   HGBA1C 6.1 05/11/2022   Lab Results  Component Value Date   PROLACTIN 21.2 10/23/2020   Lab Results  Component Value Date   CHOL 232 (H) 05/28/2021   TRIG 152.0 (H) 05/28/2021   HDL 52.20 05/28/2021   CHOLHDL 4 05/28/2021   VLDL 30.4 05/28/2021   LDLCALC 149 (H) 05/28/2021   St[place] 173 (H) 05/27/2020   Lab Results  Component Value Date   TSH 0.88 05/11/2022   TSH 1.522 04/14/2022    Therapeutic Level Labs: No results found for: "LITHIUM" No results found for: "VALPROATE" No results found for: "CBMZ"  Current  Medications: Current Outpatient Medications  Medication Sig Dispense Refill   albuterol (PROAIR HFA) 108 (90 Base) MCG/ACT inhaler Inhale 2 puffs into the lungs every 6 (six) hours as needed for wheezing or shortness of breath. 18 g 1   cetirizine (ZYRTEC) 10 MG tablet Take 10 mg by mouth daily.     Cholecalciferol (VITAMIN D3) 1000 units CAPS Take 1 capsule by mouth daily.     ibuprofen (ADVIL) 200 MG tablet Take 200 mg by mouth every 6 (six) hours as needed.     risperiDONE (RISPERDAL) 0.5 MG tablet Take 1 tablet (0.5 mg total)  by mouth at bedtime. 90 tablet 0   traZODone (DESYREL) 50 MG tablet TAKE 1/2 TO 1 (ONE-HALF TO ONE) TABLET BY MOUTH AT BEDTIME AS NEEDED FOR SLEEP 90 tablet 0   No current facility-administered medications for this visit.     Musculoskeletal: Strength & Muscle Tone: within normal limits Gait & Station: normal Patient leans: N/A  Psychiatric Specialty Exam: Review of Systems  Psychiatric/Behavioral:  Positive for sleep disturbance.   All other systems reviewed and are negative.   Blood pressure (!) 151/89, pulse 78, temperature 99.2 F (37.3 C), temperature source Oral, weight 193 lb (87.5 kg), SpO2 98 %.Body mass index is 34.46 kg/m.  General Appearance: Casual  Eye Contact:  Fair  Speech:  Normal Rate  Volume:  Normal  Mood:  Euthymic  Affect:  Congruent  Thought Process:  Goal Directed and Descriptions of Associations: Intact  Orientation:  Full (Time, Place, and Person)  Thought Content: Logical   Suicidal Thoughts:  No  Homicidal Thoughts:  No  Memory:  Immediate;   Fair Recent;   Fair Remote;   Fair  Judgement:  Fair  Insight:  Fair  Psychomotor Activity:  Normal  Concentration:  Concentration: Fair and Attention Span: Fair  Recall:  [employer] of Knowledge: Fair  Language: Fair  Akathisia:  No  Handed:  Right  AIMS (if indicated): done  Assets:  Communication Skills Desire for Improvement Housing Social Support  ADL's:  Intact   Cognition: WNL  Sleep:   restless   Screenings: [facility] Office Visit from 06/22/2022 in [facility] Office Visit from 05/10/2022 in [facility] Visit from 02/24/2022 in [facility] Visit from 12/02/2021 in [facility] Office Visit from 10/07/2021 in [facility] Total Score 0 0 0 0 0      AUDIT    Flowsheet Row Admission (Discharged) from 11/18/2019 in [facility]  Alcohol Use Disorder Identification Test Final Score (AUDIT) 0      GAD-7    Flowsheet [place] Office Visit from 05/10/2022 in [facility] Office Visit from 04/14/2022 in [facility] Office Visit from 02/24/2022 in [facility]  Total GAD-7 Score 1 3 0      PHQ2-9    [facility] Visit from 06/22/2022 in [facility] Visit from 06/06/2022 in [facility] Visit from 05/10/2022 in [facility] from 04/14/2022 in [facility] Visit from 02/24/2022 in [facility]  PHQ-2 Total Score 0 0 0 0 0  PHQ-9 Total Score 0 -- 1 -- --      [place] Visit from 06/22/2022 in [facility] Office Visit from 05/10/2022 in [facility] ED from 04/14/2022 in [facility] No Risk No Risk No Risk        Assessment and Plan: Natalie [name] is a 71 year old African-American female, recent psychosis, and at that time she was diagnosed with a UTI in the emergency department, treated with antibiotic, past history of psychosis no definitive diagnosis, with a rule out of  schizophreniform in 2020 as well as cognitive disorder.  Patient currently improved, will continue medications as noted below.  Plan Other specified schizophrenia spectrum and other psychotic disorder--improving Continue risperidone 0.5 mg p.o.  nightly Trazodone 25-50 mg p.o. nightly as needed.  Patient advised to increase the dosage to 1 tablet if she struggles with sleep.  Insomnia-improving Trazodone as noted above. Continue sleep hygiene techniques  Neurocognitive disorder-stable Patient had SLUMS completed in session today-28 out of 30.  Within normal limits.   Reviewed labs-05/11/2022-TSH-within normal limits, CBC-WBC-low otherwise within normal limits.  Vitamin D-low-primary care provider has advised to start 1000 units daily of vitamin D. PTH-elevated however per primary care provider notes daily vitamin D should help to bring it down. Hemoglobin A1c-6.1. Vitamin B12-304-borderline-patient may benefit from vitamin B12 supplements Renal function, hepatic function-within normal limits.  Will consider getting EKG as needed in the future.  Patient with elevated blood pressure reading to follow up with primary care provider.  Follow-up in clinic in 2 to 3 months or sooner if needed.  This note was generated in part or whole with voice recognition software. Voice recognition is usually quite accurate but there are transcription errors that can and very often do occur. I apologize for any typographical errors that were not detected and corrected.    Ursula Alert, MD 06/23/2022, 12:44 PM

## 2022-07-05 ENCOUNTER — Ambulatory Visit: Payer: Medicare Other | Admitting: Psychiatry

## 2022-07-06 DIAGNOSIS — H25813 Combined forms of age-related cataract, bilateral: Secondary | ICD-10-CM | POA: Diagnosis not present

## 2022-07-15 ENCOUNTER — Ambulatory Visit
Admission: RE | Admit: 2022-07-15 | Discharge: 2022-07-15 | Disposition: A | Discharge status: Home / Self Care | Payer: Medicare Other | Source: Ambulatory Visit | Attending: Internal Medicine | Admitting: Internal Medicine

## 2022-07-15 DIAGNOSIS — Z1231 Encounter for screening mammogram for malignant neoplasm of breast: Secondary | ICD-10-CM | POA: Insufficient documentation

## 2022-07-26 ENCOUNTER — Encounter: Payer: Self-pay | Admitting: Cardiology

## 2022-07-26 ENCOUNTER — Ambulatory Visit: Payer: Medicare Other | Attending: Cardiology | Admitting: Cardiology

## 2022-07-26 VITALS — BP 122/84 | HR 90 | Ht 63.0 in | Wt 193.4 lb

## 2022-07-26 DIAGNOSIS — R0609 Other forms of dyspnea: Secondary | ICD-10-CM | POA: Diagnosis not present

## 2022-07-26 DIAGNOSIS — E782 Mixed hyperlipidemia: Secondary | ICD-10-CM

## 2022-07-26 NOTE — Progress Notes (Signed)
Cardiology Office Note:    Date:  07/26/2022   ID:  Natalie [name], DOB [birth year], MRN [id number]  PCP:  Venia Carbon, MD   [facility] Providers Cardiologist:  Kate Sable, MD     Referring MD: Venia Carbon, MD   Chief Complaint  Patient presents with   New Patient (Initial Visit)    DOE, palpitations, SOB     History of Present Illness:    Natalie [name] is a 71 y.o. female with a hx of asthma who presents with shortness of breath and palpitations.  Patient has symptoms of palpitations prompting her to go to the emergency room.  She was diagnosed with a UTI which was managed with antibiotics.  Since then, she only has increased heart rates when she tries to exert herself.  Also gets shortness of breath with going upstairs.  Denies chest pain.  Denies any history of heart disease or smoking.  Denies edema.  Feels well otherwise.  Has not had any palpitations at rest since her UTI diagnosis.  Past Medical History:  Diagnosis Date   Allergy    Arthritis    Asthma    Cataract    Dementia ([place])    GERD (gastroesophageal reflux disease)    pt. denied   Heart murmur    was told years ago   History of shingles 2006   Hyperlipidemia     Past Surgical History:  Procedure Laterality Date   ABDOMINAL HYSTERECTOMY     COLONOSCOPY     POLYPECTOMY     VAGINAL DELIVERY     x2    Current Medications: Current Meds  Medication Sig   albuterol (PROAIR HFA) 108 (90 Base) MCG/ACT inhaler Inhale 2 puffs into the lungs every 6 (six) hours as needed for wheezing or shortness of breath.   cetirizine (ZYRTEC) 10 MG tablet Take 10 mg by mouth daily.   Cholecalciferol (VITAMIN D3) 1000 units CAPS Take 1 capsule by mouth daily.   ibuprofen (ADVIL) 200 MG tablet Take 200 mg by mouth every 6 (six) hours as needed.   risperiDONE (RISPERDAL) 0.5 MG tablet Take 1 tablet (0.5 mg total) by mouth at bedtime.   traZODone (DESYREL) 50 MG tablet TAKE 1/2 TO 1  (ONE-HALF TO ONE) TABLET BY MOUTH AT BEDTIME AS NEEDED FOR SLEEP     Allergies:   Patient has no known allergies.   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  Tobacco Use   Smoking status: Never    Passive exposure: Past   Smokeless tobacco: Never  Vaping Use   Vaping Use: Never used  Substance and Sexual Activity   Alcohol use: Not Currently   Drug use: No   Sexual activity: Not Currently  Other Topics Concern   Not on file  Social History Narrative   No living will   Requests daughter [name] as health care [employer]   Would accept resuscitation but no prolonged machines   No prolonged tube feeds      Right handed      Completed HS   Social Determinants of Health   Financial Resource Strain: Low Risk  (03/25/2019)   Overall Financial Resource Strain (  CARDIA)    Difficulty of Paying Living Expenses: Not very hard  Food Insecurity: Food Insecurity Present (03/25/2019)   Hunger Vital Sign    Worried About Running Out of Food in the Last Year: Sometimes true    Ran Out of Food in the Last Year: Sometimes true  Transportation Needs: Unmet Transportation Needs (03/25/2019)   PRAPARE - Hydrologist (Medical): Yes    Lack of Transportation (Non-Medical): Yes  Physical Activity: Inactive (03/25/2019)   Exercise Vital Sign    Days of Exercise per Week: 0 days    Minutes of Exercise per Session: 0 min  Stress: Not on file  Social Connections: Unknown (03/25/2019)   Social Connection and Isolation Panel [NHANES]    Frequency of Communication with Friends and Family: Not on file    Frequency of Social Gatherings with Friends and Family: Not on file    Attends Religious Services: Never    Corporate treasurer or Organizations: No    Attends Music therapist: Never    Marital Status: Never married     Family History: The patient's family history includes Alcohol abuse in an other family member; Arthritis in her mother; Breast cancer (age of onset: 35) in her daughter; Colon cancer (age of onset: 73) in her father; Colon polyps in her brother; Diabetes in her mother; Hypertension in her mother; Liver cancer in her mother; Pancreatic cancer in her brother. There is no history of Heart disease, Esophageal cancer, Rectal cancer, or Stomach cancer.  ROS:   Please see the history of present illness.     All other systems reviewed and are negative.  EKGs/Labs/Other Studies Reviewed:    The following studies were reviewed today:   EKG:  EKG is  ordered today.  The ekg ordered today demonstrates normal sinus rhythm  Recent Labs: 05/11/2022: ALT 9; BUN 12; Creatinine, Ser 0.93; Hemoglobin 14.2; Platelets 261.0; Potassium 4.2; Sodium 141; TSH 0.88  Recent Lipid Panel    Component Value Date/Time   CHOL 232 (H) 05/28/2021 1228   CHOL 242 (H) 04/11/2017 1107   TRIG 152.0 (H) 05/28/2021 1228   HDL 52.20 05/28/2021 1228   HDL 52 04/11/2017 1107   CHOLHDL 4 05/28/2021 1228   VLDL 30.4 05/28/2021 1228   LDLCALC 149 (H) 05/28/2021 1228   LDLCALC 161 (H) 04/11/2017 1107   LDLDIRECT 157.4 02/17/2010 0822     Risk Assessment/Calculations:             Physical Exam:    VS:  BP 122/84 (BP Location: Right Arm)   Pulse 90   Ht '5\' 3"'$  (1.6 m)   Wt 193 lb 6.4 oz (87.7 kg)   SpO2 95%   BMI 34.26 kg/m     Wt Readings from Last 3 Encounters:  07/26/22 193 lb 6.4 oz (87.7 kg)  06/06/22 190 lb (86.2 kg)  05/11/22 193 lb (87.5 kg)     GEN:  Well nourished, well developed in no acute distress HEENT: Normal NECK: No JVD; No carotid bruits CARDIAC: RRR, no murmurs, rubs, gallops RESPIRATORY:  Clear to auscultation without rales, wheezing or rhonchi  ABDOMEN: Soft, non-tender,  non-distended MUSCULOSKELETAL:  No edema; No deformity  SKIN: Warm and dry NEUROLOGIC:  Alert and oriented x 3 PSYCHIATRIC:  Normal affect   ASSESSMENT:    1. DOE (dyspnea on exertion)   2. Mixed hyperlipidemia    PLAN:    In order of problems listed above:  Dyspnea on exertion, palpitations with exertion.  Get echo to rule out any structural abnormalities.  Symptoms likely from deconditioning, not consistent with angina. Hyperlipidemia, low-calorie diet, low cholesterol diet recommended.  Plan to repeat lipid panel at follow-up visit, consider statin after.  Follow-up after echo.      Medication Adjustments/Labs and Tests Ordered: Current medicines are reviewed at length with the patient today.  Concerns regarding medicines are outlined above.  Orders Placed This Encounter  Procedures   EKG 12-Lead   ECHOCARDIOGRAM COMPLETE   No orders of the defined types were placed in this encounter.   Patient Instructions  Medication Instructions:   Your physician recommends that you continue on your current medications as directed. Please refer to the Current Medication list given to you today.   *If you need a refill on your cardiac medications before your next appointment, please call your pharmacy*   Lab Work:  None Ordered  If you have labs (blood work) drawn today and your tests are completely normal, you will receive your results only by: [place] (if you have MyChart) OR A paper copy in the mail If you have any lab test that is abnormal or we need to change your treatment, we will call you to review the results.   Testing/Procedures:  Echocardiogram   Your physician has requested that you have an echocardiogram. Echocardiography is a painless test that uses sound waves to create images of your heart. It provides your doctor with information about the size and shape of your heart and how well your heart's chambers and valves are working. This procedure takes  approximately one hour. There are no restrictions for this procedure. Please note; depending on visual quality an IV may need to be placed.     Follow-Up: At [facility], you and your health needs are our priority.  As part of our continuing mission to provide you with exceptional heart care, we have created designated Provider Care Teams.  These Care Teams include your primary Cardiologist (physician) and Advanced Practice Providers (APPs -  Physician Assistants and Nurse Practitioners) who all work together to provide you with the care you need, when you need it.  We recommend signing up for the patient portal called "MyChart".  Sign up information is provided on this After Visit Summary.  MyChart is used to connect with patients for Virtual Visits (Telemedicine).  Patients are able to view lab/test results, encounter notes, upcoming appointments, etc.  Non-urgent messages can be sent to your provider as well.   To learn more about what you can do with MyChart, go to [email or web address].    Your next appointment:    After Echocardiogram  The format for your next appointment:   In Person  Provider:   You may see Kate Sable, MD or one of the following Advanced Practice Providers on your designated Care Team:   Murray Hodgkins, NP Christell Faith, PA-C Cadence Kathlen Mody, PA-C Gerrie Nordmann, NP    Signed, Kate Sable, MD  07/26/2022 9:51 AM    [place]

## 2022-07-26 NOTE — Patient Instructions (Signed)
Medication Instructions:   Your physician recommends that you continue on your current medications as directed. Please refer to the Current Medication list given to you today.  *If you need a refill on your cardiac medications before your next appointment, please call your pharmacy*   Lab Work:  None Ordered  If you have labs (blood work) drawn today and your tests are completely normal, you will receive your results only by: MyChart Message (if you have MyChart) OR A paper copy in the mail If you have any lab test that is abnormal or we need to change your treatment, we will call you to review the results.   Testing/Procedures:  Echocardiogram   Your physician has requested that you have an echocardiogram. Echocardiography is a painless test that uses sound waves to create images of your heart. It provides your doctor with information about the size and shape of your heart and how well your heart's chambers and valves are working. This procedure takes approximately one hour. There are no restrictions for this procedure. Please note; depending on visual quality an IV may need to be placed.     Follow-Up: At [facility], you and your health needs are our priority.  As part of our continuing mission to provide you with exceptional heart care, we have created designated Provider Care Teams.  These Care Teams include your primary Cardiologist (physician) and Advanced Practice Providers (APPs -  Physician Assistants and Nurse Practitioners) who all work together to provide you with the care you need, when you need it.  We recommend signing up for the patient portal called "MyChart".  Sign up information is provided on this After Visit Summary.  MyChart is used to connect with patients for Virtual Visits (Telemedicine).  Patients are able to view lab/test results, encounter notes, upcoming appointments, etc.  Non-urgent messages can be sent to your provider as well.   To learn more  about what you can do with MyChart, go to [email or web address].    Your next appointment:    After Echocardiogram  The format for your next appointment:   In Person  Provider:   You may see Brian Agbor-Etang, MD or one of the following Advanced Practice Providers on your designated Care Team:   Christopher Berge, NP Ryan Dunn, PA-C Cadence Furth, PA-C Sheri Hammock, NP     

## 2022-07-27 ENCOUNTER — Ambulatory Visit: Payer: Medicare Other | Admitting: Psychiatry

## 2022-08-04 ENCOUNTER — Other Ambulatory Visit: Payer: Self-pay | Admitting: Psychiatry

## 2022-08-04 DIAGNOSIS — F29 Unspecified psychosis not due to a substance or known physiological condition: Secondary | ICD-10-CM

## 2022-08-04 DIAGNOSIS — G4701 Insomnia due to medical condition: Secondary | ICD-10-CM

## 2022-08-11 ENCOUNTER — Ambulatory Visit: Payer: Medicare Other | Attending: Cardiology

## 2022-08-11 DIAGNOSIS — R0609 Other forms of dyspnea: Secondary | ICD-10-CM

## 2022-08-12 LAB — ECHOCARDIOGRAM COMPLETE
AR max vel: 2.91 cm2
AV Area VTI: 2.87 cm2
AV Area mean vel: 2.85 cm2
AV Mean grad: 3 mmHg
AV Peak grad: 3.9 mmHg
Ao pk vel: 0.99 m/s
Area-P 1/2: 2.73 cm2
S' Lateral: 1.8 cm

## 2022-08-22 ENCOUNTER — Ambulatory Visit: Payer: Medicare Other | Admitting: Physician Assistant

## 2022-08-24 ENCOUNTER — Ambulatory Visit (INDEPENDENT_AMBULATORY_CARE_PROVIDER_SITE_OTHER): Payer: Medicare Other | Admitting: Psychiatry

## 2022-08-24 ENCOUNTER — Encounter: Payer: Self-pay | Admitting: Psychiatry

## 2022-08-24 VITALS — BP 117/77 | HR 85 | Temp 98.5°F | Ht 63.0 in | Wt 193.2 lb

## 2022-08-24 DIAGNOSIS — F28 Other psychotic disorder not due to a substance or known physiological condition: Secondary | ICD-10-CM

## 2022-08-24 DIAGNOSIS — F0670 Mild neurocognitive disorder due to known physiological condition without behavioral disturbance: Secondary | ICD-10-CM | POA: Diagnosis not present

## 2022-08-24 DIAGNOSIS — G4701 Insomnia due to medical condition: Secondary | ICD-10-CM | POA: Diagnosis not present

## 2022-08-24 NOTE — Progress Notes (Signed)
[facility] MD OP Progress Note  08/24/2022 2:52 PM Natalie [name]  MRN:  [id number]  Chief Complaint:  Chief Complaint  Patient presents with   Follow-up   Medication Refill   Depression   Paranoid   HPI: Natalie [name] is a 71 year old American female, lives in [place], retired, has a history of psychosis , history of recent UTI, asthma was evaluated in office today.  Patient today reports she is currently doing fairly well.  Had a good holiday season with family.  Reports she currently does not have any hallucinations, denies any paranoia, did not appear to be preoccupied with any delusions.  Reports she is tolerating the risperidone well.  Denies side effects.  Patient reports she is currently sleeping well.  Patient reports appetite is fair.  Patient denies any suicidality, homicidality.  Patient had recent cardiology consultation for dyspnea on exertion.  Patient had an EKG as well as echocardiogram which did not show anything significant.  Reviewed notes per Dr.Agbor-Etang dated 07/26/2022.  Visit Diagnosis:    ICD-10-CM   1. 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 Mild neurocognitive disorder due to multiple etiologies  F06.70     3. Insomnia due to medical condition  G47.01    mood      Past Psychiatric History: Reviewed past psychiatric history from progress note on 03/25/2019.  Past trials of medications like Invega Sustenna, risperidone  Past Medical History:  Past Medical History:  Diagnosis Date   Allergy    Arthritis    Asthma    Cataract    Dementia ([place])    GERD (gastroesophageal reflux disease)    pt. denied   Heart murmur    was told years ago   History of shingles 2006   Hyperlipidemia     Past Surgical History:  Procedure Laterality Date   ABDOMINAL HYSTERECTOMY     COLONOSCOPY      POLYPECTOMY     VAGINAL DELIVERY     x2    Family Psychiatric History: Reviewed family psychiatric history from progress note on 03/25/2019.  Family History:  Family History  Problem Relation Age of Onset   Arthritis Mother    Diabetes Mother    Hypertension Mother    Liver cancer Mother    Colon cancer Father 52   Pancreatic cancer Brother    Colon polyps Brother    Breast cancer Daughter 82   Alcohol abuse Other    Heart disease Neg Hx    Esophageal cancer Neg Hx    Rectal cancer Neg Hx    Stomach cancer Neg Hx     Social History: Reviewed social history from progress note on 03/25/2019. 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  Tobacco Use   Smoking status: Never    Passive exposure: Past   Smokeless tobacco: Never  Vaping Use   Vaping Use: Never used  Substance and Sexual Activity   Alcohol use: Not Currently   Drug use: No   Sexual activity: Not Currently  Other Topics Concern  Not on file  Social History Narrative   No living will   Requests daughter Natalie [name] as health care POA   Would accept resuscitation but no prolonged machines   No prolonged tube feeds      Right handed      Completed HS   Social Determinants of Health   Financial Resource Strain: Low Risk  (03/25/2019)   Overall Financial Resource Strain (CARDIA)    Difficulty of Paying Living Expenses: Not very hard  Food Insecurity: Food Insecurity Present (03/25/2019)   Hunger Vital Sign    Worried About Running Out of Food in the Last Year: Sometimes true    Ran Out of Food in the Last Year: Sometimes true  Transportation Needs: Unmet Transportation Needs (03/25/2019)   PRAPARE - Hydrologist (Medical): Yes     Lack of Transportation (Non-Medical): Yes  Physical Activity: Inactive (03/25/2019)   Exercise Vital Sign    Days of Exercise per Week: 0 days    Minutes of Exercise per Session: 0 min  Stress: Not on file  Social Connections: Unknown (03/25/2019)   Social Connection and Isolation Panel [NHANES]    Frequency of Communication with Friends and Family: Not on file    Frequency of Social Gatherings with Friends and Family: Not on file    Attends Religious Services: Never    Active Member of Clubs or Organizations: No    Attends Music therapist: Never    Marital Status: Never married    Allergies: No Known Allergies  Metabolic Disorder Labs: Lab Results  Component Value Date   HGBA1C 6.1 05/11/2022   Lab Results  Component Value Date   PROLACTIN 21.2 10/23/2020   Lab Results  Component Value Date   CHOL 232 (H) 05/28/2021   TRIG 152.0 (H) 05/28/2021   HDL 52.20 05/28/2021   CHOLHDL 4 05/28/2021   VLDL 30.4 05/28/2021   LDLCALC 149 (H) 05/28/2021   [place] 173 (H) 05/27/2020   Lab Results  Component Value Date   TSH 0.88 05/11/2022   TSH 1.522 04/14/2022    Therapeutic Level Labs: No results found for: "LITHIUM" No results found for: "VALPROATE" No results found for: "CBMZ"  Current Medications: Current Outpatient Medications  Medication Sig Dispense Refill   albuterol (PROAIR HFA) 108 (90 Base) MCG/ACT inhaler Inhale 2 puffs into the lungs every 6 (six) hours as needed for wheezing or shortness of breath. 18 g 1   cetirizine (ZYRTEC) 10 MG tablet Take 10 mg by mouth daily.     Cholecalciferol (VITAMIN D3) 1000 units CAPS Take 1 capsule by mouth daily.     ibuprofen (ADVIL) 200 MG tablet Take 200 mg by mouth every 6 (six) hours as needed.     risperiDONE (RISPERDAL) 0.5 MG tablet TAKE 1 TABLET BY MOUTH AT BEDTIME 90 tablet 0   traZODone (DESYREL) 50 MG tablet TAKE 1/2 TO 1 (ONE-HALF TO ONE) TABLET BY MOUTH AT BEDTIME AS NEEDED FOR SLEEP 90 tablet 0    No current facility-administered medications for this visit.     Musculoskeletal: Strength & Muscle Tone: within normal limits Gait & Station: normal Patient leans: N/A  Psychiatric Specialty Exam: Review of Systems  Respiratory:  Positive for shortness of breath (On exertion).   Psychiatric/Behavioral: Negative.    All other systems reviewed and are negative.   Blood pressure 117/77, pulse 85, temperature 98.5 F (36.9 C), temperature source Oral, height '5\' 3"'$  (1.6 m), weight 193 lb  3.2 oz (87.6 kg).Body mass index is 34.22 kg/m.  General Appearance: Casual  Eye Contact:  Fair  Speech:  Clear and Coherent  Volume:  Normal  Mood:  Euthymic  Affect:  Congruent  Thought Process:  Goal Directed and Descriptions of Associations: Intact  Orientation:  Full (Time, Place, and Person)  Thought Content: Logical   Suicidal Thoughts:  No  Homicidal Thoughts:  No  Memory:  Immediate;   Fair Recent;   Fair Remote;   Fair  Judgement:  Fair  Insight:  Fair  Psychomotor Activity:  Normal  Concentration:  Concentration: Fair and Attention Span: Fair  Recall:  [employer] of Knowledge: Fair  Language: Fair  Akathisia:  No  Handed:  Right  AIMS (if indicated): not done  Assets:  Communication Skills Desire for Improvement Housing Social Support  ADL's:  Intact  Cognition: WNL  Sleep:  Fair   Screenings: [facility] Office Visit from 08/24/2022 in [facility] Office Visit from 06/22/2022 in [facility] Office Visit from 05/10/2022 in [facility] Visit from 02/24/2022 in [facility] Office Visit from 12/02/2021 in [facility] Total Score 0 0 0 0 0      AUDIT    Flowsheet Row Admission (Discharged) from 11/18/2019 in [facility]  Alcohol Use Disorder Identification Test Final Score (AUDIT) 0       GAD-7    Flowsheet Row Office Visit from 08/24/2022 in [facility] Office Visit from 05/10/2022 in [facility] Visit from 04/14/2022 in [facility] Visit from 02/24/2022 in [facility]  Total GAD-7 Score 0 1 3 0      PHQ2-9    [place] Visit from 08/24/2022 in [facility] from 06/22/2022 in [facility] Visit from 06/06/2022 in [facility] Visit from 05/10/2022 in [facility] Visit from 04/14/2022 in [facility]  PHQ-2 Total Score 0 0 0 0 0  PHQ-9 Total Score 0 0 -- 1 --      [place] Visit from 08/24/2022 in [facility] Office Visit from 06/22/2022 in [facility] Office Visit from 05/10/2022 in [facility] No Risk No Risk No Risk        Assessment and Plan: MERVE HOTARD is a 72 year old African-American female with history of psychosis was evaluated in office today.  Patient is currently stable.  Plan as noted below.  Plan Other specified schizophrenia spectrum and other psychotic disorder-stable Risperidone 0.5 mg p.o. nightly Trazodone 25-50 mg at bedtime as needed   Insomnia-stable Trazodone 25-50 mg at bedtime as needed Continue sleep hygiene techniques  Neuro cognitive disorder-stable Will monitor closely [place]-dated 06/22/2022-28 out of 30  Reviewed notes per cardiology-Dr.Agbor-Etang -07/26/2022-as noted above.  Follow-up in clinic in 3 to 4 months or sooner if needed.  This note was generated in part or whole with voice recognition software. Voice recognition is usually quite accurate but there are transcription errors that can and  very often do occur. I apologize for any typographical errors that were not detected and corrected.     Ursula Alert, MD 08/25/2022, 5:11 PM

## 2022-08-30 ENCOUNTER — Telehealth: Payer: Self-pay | Admitting: Internal Medicine

## 2022-08-30 DIAGNOSIS — R0609 Other forms of dyspnea: Secondary | ICD-10-CM

## 2022-08-30 NOTE — Telephone Encounter (Signed)
Called to see what she was needing to see pulmonary for as she has seen cardiology for Dyspnea on Exertion and Elevated Heart Rate. She said Dr [name] suggested she see pulmonary for the Dyspnea. Asked that she contact her PCP to get referral done. Pt would like [facility]. She knows it may be a few days before they contact her.

## 2022-08-30 NOTE — Telephone Encounter (Signed)
Patient called in and was wondering if Dr. Silvio Pate could send a referral in for her to Pulmonary. Thank you!

## 2022-08-31 NOTE — Telephone Encounter (Signed)
Spoke to pt. She will wait to hear about the referral

## 2022-08-31 NOTE — Addendum Note (Signed)
Addended by: Viviana Simpler I on: 08/31/2022 07:11 AM   Modules accepted: Orders

## 2022-09-20 DIAGNOSIS — U071 COVID-19: Secondary | ICD-10-CM | POA: Diagnosis not present

## 2022-09-20 DIAGNOSIS — Z03818 Encounter for observation for suspected exposure to other biological agents ruled out: Secondary | ICD-10-CM | POA: Diagnosis not present

## 2022-09-20 DIAGNOSIS — H6691 Otitis media, unspecified, right ear: Secondary | ICD-10-CM | POA: Diagnosis not present

## 2022-09-26 ENCOUNTER — Ambulatory Visit: Payer: Medicare Other | Admitting: Internal Medicine

## 2022-09-26 ENCOUNTER — Encounter: Payer: Self-pay | Admitting: Internal Medicine

## 2022-09-26 VITALS — BP 120/70 | HR 86 | Temp 97.7°F | Ht 63.0 in | Wt 195.8 lb

## 2022-09-26 DIAGNOSIS — G4733 Obstructive sleep apnea (adult) (pediatric): Secondary | ICD-10-CM | POA: Diagnosis not present

## 2022-09-26 DIAGNOSIS — J4520 Mild intermittent asthma, uncomplicated: Secondary | ICD-10-CM

## 2022-09-26 NOTE — Progress Notes (Signed)
Patient was in the office today. She said previously she was suppose to be using a CPAP machine but could not afford it. We gave her the information to fill out for the CPAP assistance program. The patient declined to fill out the forms in the office today. She would like to think about it and call us back.

## 2022-09-26 NOTE — Patient Instructions (Signed)
Recommend Starting AUTO CPAP 5-10 cm h20 Will refer to [facility] for patient assitance  Recommend weight loss  Use albuterol as needed  Avoid secondhand smoke Avoid SICK contacts Recommend  Masking  when appropriate Recommend Keep up-to-date with vaccinations

## 2022-09-26 NOTE — Progress Notes (Signed)
Natalie [name]      Date: 09/26/2022,   MRN# [id number] Natalie [name] 14-Apr-1951     CHIEF COMPLAINT:   SOB-assessment for asthma Assessment for OSA   HISTORY OF PRESENT ILLNESS   72 year old African-American female with a history of asthma presents today with shortness of breath and history of palpitations along with a diagnosis of obstructive sleep apnea  Patient was diagnosed with obstructive sleep apnea in 2020 with AHI of 13 She underwent a CPAP titration study however did not follow-up with CPAP therapy due to cost  Patient presented to the ER a month ago with palpitations and shortness of breath Patient was seen and cardiologist office echocardiogram was ordered Patient was then referred to [place] for further evaluation  No exacerbation at this time No evidence of heart failure at this time No evidence or signs of infection at this time No respiratory distress No fevers, chills, nausea, vomiting, diarrhea No evidence of lower extremity edema No evidence hemoptysis  Patient has symptoms of palpitations prompting her to go to the emergency room.  She was diagnosed with a UTI which was managed with antibiotics.   gets shortness of breath with going upstairs.  Denies chest pain.  Denies any history of heart disease or smoking.  Denies edema.  Feels well otherwise.  Has not had any palpitations at rest since her UTI diagnosis.  Uses Albuterol Inhaler as needed            PAST MEDICAL HISTORY   Past Medical History:  Diagnosis Date   Allergy    Arthritis    Asthma    Cataract    Dementia ([place])    GERD (gastroesophageal reflux disease)    pt. denied   Heart murmur    was told years ago   History of shingles 2006   Hyperlipidemia      SURGICAL HISTORY   Past Surgical History:  Procedure Laterality Date   ABDOMINAL HYSTERECTOMY     COLONOSCOPY     POLYPECTOMY     VAGINAL DELIVERY     x2     FAMILY HISTORY    Family History  Problem Relation Age of Onset   Arthritis Mother    Diabetes Mother    Hypertension Mother    Liver cancer Mother    Colon cancer Father 57   Pancreatic cancer Brother    Colon polyps Brother    Breast cancer Daughter 11   Alcohol abuse Other    Heart disease Neg Hx    Esophageal cancer Neg Hx    Rectal cancer Neg Hx    Stomach cancer Neg Hx      SOCIAL HISTORY   Social History   Tobacco Use   Smoking status: Never    Passive exposure: Past   Smokeless tobacco: Never  Vaping Use   Vaping Use: Never used  Substance Use Topics   Alcohol use: Not Currently   Drug use: No     MEDICATIONS    Home Medication:  Current Outpatient Rx   Order #: [id number] Class: Normal   Order #: [id number] Class: Historical Med   Order #: [id number] Class: Historical Med   Order #: [id number] Class: Historical Med   Order #: [id number] Class: Normal   Order #: [id number] Class: Normal    Current Medication:  Current Outpatient Medications:    albuterol (PROAIR HFA) 108 (90 Base) MCG/ACT inhaler, Inhale 2 puffs into the lungs every 6 (six) hours as needed for wheezing or shortness of  breath., Disp: 18 g, Rfl: 1   cetirizine (ZYRTEC) 10 MG tablet, Take 10 mg by mouth daily., Disp: , Rfl:    Cholecalciferol (VITAMIN D3) 1000 units CAPS, Take 1 capsule by mouth daily., Disp: , Rfl:    ibuprofen (ADVIL) 200 MG tablet, Take 200 mg by mouth every 6 (six) hours as needed., Disp: , Rfl:    risperiDONE (RISPERDAL) 0.5 MG tablet, TAKE 1 TABLET BY MOUTH AT BEDTIME, Disp: 90 tablet, Rfl: 0   traZODone (DESYREL) 50 MG tablet, TAKE 1/2 TO 1 (ONE-HALF TO ONE) TABLET BY MOUTH AT BEDTIME AS NEEDED FOR SLEEP, Disp: 90 tablet, Rfl: 0    ALLERGIES   Patient has no known allergies.     REVIEW OF SYSTEMS    Review of Systems:  Gen:  Denies  fever, sweats, chills weigh loss  HEENT: Denies blurred vision, double vision, ear pain, eye pain, hearing loss, nose bleeds, sore throat Cardiac:   No dizziness, chest pain or heaviness, chest tightness,edema Resp:   Denies cough or sputum porduction, shortness of breath,wheezing, hemoptysis,  Gi: Denies swallowing difficulty, stomach pain, nausea or vomiting, diarrhea, constipation, bowel incontinence Gu:  Denies bladder incontinence, burning urine Ext:   Denies Joint pain, stiffness or swelling Skin: Denies  skin rash, easy bruising or bleeding or hives Endoc:  Denies polyuria, polydipsia , polyphagia or weight change Psych:   Denies depression, insomnia or hallucinations   Other:  All other systems negative   BP 120/70 (BP Location: Left Arm, Cuff Size: Normal)   Pulse 86   Temp 97.7 F (36.5 C)   Ht 5' 3"$  (1.6 m)   Wt 195 lb 12.8 oz (88.8 kg)   SpO2 98%   BMI 34.68 kg/m    PHYSICAL EXAM  General Appearance: No distress  EYES PERRLA, EOM intact.   NECK Supple, No JVD Pulmonary: normal breath sounds, No wheezing.  CardiovascularNormal S1,S2.  No m/r/g.   Abdomen: Benign, Soft, non-tender. Skin:   warm, no rashes, no ecchymosis  Extremities: normal, no cyanosis, clubbing. Neuro:without focal findings,  speech normal  PSYCHIATRIC: Mood, affect within normal limits.   ALL OTHER ROS ARE NEGATIVE        ASSESSMENT/PLAN   72 year old African-American female morbidly obese with obstructive sleep apnea associated with asthma mild intermittent at this time with deconditioned state   Asthma mild intermittent Use albuterol as needed Patient is well-controlled without any maintenance therapy at this time Recommend therapy for OSA as this would prevent asthma exacerbations  Palpitations Follow-up echocardiogram with cardiology   Recommend Starting AUTO CPAP 5-10 cm h20 Will refer to [facility] for patient assitance At this time her noncompliance related to cost  Recommend weight loss  Use albuterol as needed  Avoid secondhand smoke Avoid SICK contacts Recommend  Masking  when appropriate Recommend Keep  up-to-date with vaccinations   Obesity -recommend significant weight loss -recommend changing diet  Deconditioned state -Recommend increased daily activity and exercise   CURRENT MEDICATIONS REVIEWED AT LENGTH WITH PATIENT TODAY   Patient  satisfied with Plan of action and management. All questions answered  Follow up 6 months  Total Time Spent 38 mins   Corrin Parker, M.D.  [facility]  Medical Director [facility] Director [facility] Cardio-P[facility] Department

## 2022-11-01 ENCOUNTER — Other Ambulatory Visit: Payer: Self-pay | Admitting: Psychiatry

## 2022-11-01 DIAGNOSIS — G4701 Insomnia due to medical condition: Secondary | ICD-10-CM

## 2022-11-01 DIAGNOSIS — F29 Unspecified psychosis not due to a substance or known physiological condition: Secondary | ICD-10-CM

## 2022-11-23 ENCOUNTER — Encounter: Payer: Self-pay | Admitting: Psychiatry

## 2022-11-23 ENCOUNTER — Ambulatory Visit (INDEPENDENT_AMBULATORY_CARE_PROVIDER_SITE_OTHER): Payer: Medicare Other | Admitting: Psychiatry

## 2022-11-23 VITALS — BP 127/75 | HR 73 | Temp 97.3°F | Ht 63.0 in | Wt 198.6 lb

## 2022-11-23 DIAGNOSIS — F0670 Mild neurocognitive disorder due to known physiological condition without behavioral disturbance: Secondary | ICD-10-CM

## 2022-11-23 DIAGNOSIS — F28 Other psychotic disorder not due to a substance or known physiological condition: Secondary | ICD-10-CM

## 2022-11-23 DIAGNOSIS — G4701 Insomnia due to medical condition: Secondary | ICD-10-CM

## 2022-11-23 NOTE — Progress Notes (Signed)
BH MD OP Progress Note  11/23/2022 2:57 PM Natalie [name]  MRN:  [id number]  Chief Complaint:  Chief Complaint  Patient presents with   Follow-up   Depression   Medication Refill   HPI: Natalie [name] is a 72 year old African-American female, lives in [place], retired, has a history of psychosis, history of UTI, asthma was evaluated in office today.  Patient is currently compliant on the risperidone, denies any significant mood swings, psychosis.  She did not appear to be preoccupied with any delusions or paranoia.  Patient reports snoring.  Had recent sleep study, I have reviewed notes per Dr. Erin Fulling -dated 09/26/2022.  Patient was diagnosed with obstructive sleep apnea, was advised to start using CPAP.  Patient however today reports she has been unable to afford it.  She is currently working on it.  However reports sleep is overall okay.  Patient appeared to be alert, oriented to person place time situation.  3 word memory immediate 3 out of 3, after 5 minutes 3 out of 3.  Patient denies any suicidality or homicidality.  Patient denies any other concerns today.  Visit Diagnosis:    ICD-10-CM   1. 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    2. Mild neurocognitive disorder due to multiple etiologies  F06.70     3. Insomnia due to medical condition  G47.01    OSA noncompliant on CPAP      Past Psychiatric History: Reviewed past psychiatric history from progress note on 03/25/2019.  Past trials of medications like Invega Sustenna, risperidone.  Past Medical History:  Past Medical History:  Diagnosis Date   Allergy    Arthritis    Asthma    Cataract    Dementia    GERD (gastroesophageal reflux disease)    pt. denied   Heart murmur    was told years ago   History of shingles 2006   Hyperlipidemia     Past  Surgical History:  Procedure Laterality Date   ABDOMINAL HYSTERECTOMY     COLONOSCOPY     POLYPECTOMY     VAGINAL DELIVERY     x2    Family Psychiatric History: Reviewed family History from progress note on 03/25/2019.  Family History:  Family History  Problem Relation Age of Onset   Arthritis Mother    Diabetes Mother    Hypertension Mother    Liver cancer Mother    Colon cancer Father 35   Pancreatic cancer Brother    Colon polyps Brother    Breast cancer Daughter 82   Alcohol abuse Other    Heart disease Neg Hx    Esophageal cancer Neg Hx    Rectal cancer Neg Hx    Stomach cancer Neg Hx     Social History: Reviewed social history from progress note on 03/25/2019. 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    Comment: retired  Tobacco Use   Smoking status: Never    Passive exposure: Past   Smokeless tobacco: Never  Vaping Use   Vaping Use: Never used  Substance and Sexual Activity   Alcohol  use: Not Currently   Drug use: No   Sexual activity: Not Currently  Other Topics Concern   Not on file  Social History Narrative   No living will   Requests daughter Cordelia Pen as health care POA   Would accept resuscitation but no prolonged machines   No prolonged tube feeds      Right handed      Completed HS   Social Determinants of Health   Financial Resource Strain: Low Risk  (03/25/2019)   Overall Financial Resource Strain (CARDIA)    Difficulty of Paying Living Expenses: Not very hard  Food Insecurity: Food Insecurity Present (03/25/2019)   Hunger Vital Sign    Worried About Running Out of Food in the Last Year: Sometimes true    Ran Out of Food in the Last Year: Sometimes true  Transportation Needs: Unmet Transportation Needs  (03/25/2019)   PRAPARE - Administrator, Civil Service (Medical): Yes    Lack of Transportation (Non-Medical): Yes  Physical Activity: Inactive (03/25/2019)   Exercise Vital Sign    Days of Exercise per Week: 0 days    Minutes of Exercise per Session: 0 min  Stress: Not on file  Social Connections: Unknown (03/25/2019)   Social Connection and Isolation Panel [NHANES]    Frequency of Communication with Friends and Family: Not on file    Frequency of Social Gatherings with Friends and Family: Not on file    Attends Religious Services: Never    Active Member of Clubs or Organizations: No    Attends Engineer, structural: Never    Marital Status: Never married    Allergies: No Known Allergies  Metabolic Disorder Labs: Lab Results  Component Value Date   HGBA1C 6.1 05/11/2022   Lab Results  Component Value Date   PROLACTIN 21.2 10/23/2020   Lab Results  Component Value Date   CHOL 232 (H) 05/28/2021   TRIG 152.0 (H) 05/28/2021   HDL 52.20 05/28/2021   CHOLHDL 4 05/28/2021   VLDL 30.4 05/28/2021   LDLCALC 149 (H) 05/28/2021   LDLCALC 173 (H) 05/27/2020   Lab Results  Component Value Date   TSH 0.88 05/11/2022   TSH 1.522 04/14/2022    Therapeutic Level Labs: No results found for: "LITHIUM" No results found for: "VALPROATE" No results found for: "CBMZ"  Current Medications: Current Outpatient Medications  Medication Sig Dispense Refill   albuterol (PROAIR HFA) 108 (90 Base) MCG/ACT inhaler Inhale 2 puffs into the lungs every 6 (six) hours as needed for wheezing or shortness of breath. 18 g 1   cetirizine (ZYRTEC) 10 MG tablet Take 10 mg by mouth daily.     Cholecalciferol (VITAMIN D3) 1000 units CAPS Take 1 capsule by mouth daily.     ibuprofen (ADVIL) 200 MG tablet Take 200 mg by mouth every 6 (six) hours as needed.     risperiDONE (RISPERDAL) 0.5 MG tablet TAKE 1 TABLET BY MOUTH AT BEDTIME 90 tablet 0   traZODone (DESYREL) 50 MG tablet TAKE 1/2 TO  1 (ONE-HALF TO ONE) TABLET BY MOUTH EVERY DAY AT BEDTIME AS NEEDED FOR SLEEP 90 tablet 0   No current facility-administered medications for this visit.     Musculoskeletal: Strength & Muscle Tone: within normal limits Gait & Station: normal Patient leans: N/A  Psychiatric Specialty Exam: Review of Systems  Psychiatric/Behavioral: Negative.    All other systems reviewed and are negative.   Blood pressure 127/75, pulse 73, temperature (!) 97.3 F (36.3  C), temperature source Skin, height 5\' 3"  (1.6 m), weight 198 lb 9.6 oz (90.1 kg).Body mass index is 35.18 kg/m.  General Appearance: Casual  Eye Contact:  Fair  Speech:  Clear and Coherent  Volume:  Normal  Mood:  Euthymic  Affect:  Congruent  Thought Process:  Goal Directed and Descriptions of Associations: Intact  Orientation:  Full (Time, Place, and Person)  Thought Content: Logical   Suicidal Thoughts:  No  Homicidal Thoughts:  No  Memory:  Immediate;   Fair Recent;   Fair Remote;   Fair  Judgement:  Fair  Insight:  Fair  Psychomotor Activity:  Normal  Concentration:  Concentration: Fair and Attention Span: Fair  Recall:  [employer] of Knowledge: Fair  Language: Fair  Akathisia:  No  Handed:  Right  AIMS (if indicated): not done  Assets:  Communication Skills Desire for Improvement Housing Transportation  ADL's:  Intact  Cognition: WNL  Sleep:  Fair   Screenings: Midwife Visit from 11/23/2022 in [facility] Office Visit from 08/24/2022 in [facility] Visit from 06/22/2022 in [facility] Office Visit from 05/10/2022 in [facility] Office Visit from 02[facility]24/2022 in [facility]  AIMS Total Score 0 0 0 0 0      AUDIT    Flowsheet Row Admission (Discharged) from 11/18/2019 in [facility]  INPATIENT BEHAVIORAL MEDICINE  Alcohol Use Disorder Identification Test Final Score (AUDIT) 0      GAD-7    Flowsheet Row Office Visit from 11/23/2022 in [facility] Office Visit from 08/24/2022 in [facility] Office Visit from 05/10/2022 in [facility] Office Visit from 04/14/2022 in [facility] Office Visit from 02/24/2022 in [facility]  Total GAD-7 Score 0 0 1 3 0      PHQ2-9    Flowsheet Row Office Visit from 11/23/2022 in [facility] Office Visit from 08/24/2022 in [facility] Office Visit from 06/22/2022 in [facility] Office Visit from 06/06/2022 in [facility] Office Visit from 05/10/2022 in [facility]  PHQ-2 Total Score 0 0 0 0 0  PHQ-9 Total Score 0 0 0 -- 1      Flowsheet Row Office Visit from 11/23/2022 in [facility] Office Visit from 08/24/2022 in [facility] Office Visit from 06/22/2022 in [facility]  C-SSRS RISK CATEGORY No Risk No Risk No Risk        Assessment and Plan: MELANA ROBICHAUX is a 72 year old African-American female with history of psychosis was evaluated in office today.  Patient is currently stable.  Plan as noted below.  Plan Other specified schizophrenia spectrum another psychotic disorder-stable Risperidone 0.5 mg p.o. nightly Trazodone 25-50 mg at bedtime as needed  Insomnia-able Trazodone 25-50 mg at bedtime as needed Continue sleep hygiene techniques  Mild neurocognitive disorder-stable SLUMS -06/22/2022-28 out of 30  Follow-up  in clinic in 4 to 5 months or sooner if needed.    Consent: Patient/Guardian gives verbal consent for treatment and assignment of benefits for services provided during this visit. Patient/Guardian expressed understanding and agreed to  proceed.   This note was generated in part or whole with voice recognition software. Voice recognition is usually quite accurate but there are transcription errors that can and very often do occur. I apologize for any typographical errors that were not detected and corrected.      Jomarie Longs, MD 11/24/2022, 12:41 PM

## 2023-02-08 ENCOUNTER — Other Ambulatory Visit: Payer: Self-pay | Admitting: Psychiatry

## 2023-02-08 DIAGNOSIS — F29 Unspecified psychosis not due to a substance or known physiological condition: Secondary | ICD-10-CM

## 2023-02-08 DIAGNOSIS — G4701 Insomnia due to medical condition: Secondary | ICD-10-CM

## 2023-02-10 ENCOUNTER — Other Ambulatory Visit: Payer: Self-pay | Admitting: Psychiatry

## 2023-02-10 DIAGNOSIS — G4701 Insomnia due to medical condition: Secondary | ICD-10-CM

## 2023-02-10 DIAGNOSIS — F29 Unspecified psychosis not due to a substance or known physiological condition: Secondary | ICD-10-CM

## 2023-03-22 ENCOUNTER — Encounter: Payer: Self-pay | Admitting: Psychiatry

## 2023-03-22 ENCOUNTER — Ambulatory Visit: Payer: Medicare Other | Admitting: Psychiatry

## 2023-03-22 VITALS — BP 118/77 | HR 98 | Temp 98.1°F | Ht 63.0 in | Wt 202.8 lb

## 2023-03-22 DIAGNOSIS — F0670 Mild neurocognitive disorder due to known physiological condition without behavioral disturbance: Secondary | ICD-10-CM | POA: Diagnosis not present

## 2023-03-22 DIAGNOSIS — G4701 Insomnia due to medical condition: Secondary | ICD-10-CM | POA: Diagnosis not present

## 2023-03-22 DIAGNOSIS — Z79899 Other long term (current) drug therapy: Secondary | ICD-10-CM | POA: Diagnosis not present

## 2023-03-22 DIAGNOSIS — F28 Other psychotic disorder not due to a substance or known physiological condition: Secondary | ICD-10-CM | POA: Diagnosis not present

## 2023-03-22 NOTE — Progress Notes (Unsigned)
BH MD OP Progress Note  03/22/2023 2:00 PM Natalie [name]  MRN:  [id number]  Chief Complaint:  Chief Complaint  Patient presents with   Follow-up   Anxiety   Depression   Medication Refill   HPI: Natalie [name] is a 72 year old African-American female, lives in [place], retired, has a history of psychosis, history of UTI, asthma was evaluated in office today.  Patient today reports she is currently stable on current medication regimen.  Denies any significant sadness, anxiety, mood swings.  Denies any delusions of paranoia or hallucinations.  Patient appeared to be pleasant in session.  She appeared to be alert, oriented to person place time and situation.  An MMSE was completed in session today.  Patient continues to have sleep problems, was diagnosed with sleep apnea, currently awaiting to get a CPAP device.  Patient reports she had some issue getting her CPAP due to her insurance.  She has an appointment with pulmonologist tomorrow.  Patient denies any suicidality or homicidality.  Patient denies any side effects to medications.  Denies any other concerns today.  Visit Diagnosis:    ICD-10-CM   1. 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    2. Mild neurocognitive disorder due to multiple etiologies  F06.70     3. Insomnia due to medical condition  G47.01    OSA noncompliant with CPAP    4. High risk medication use  Z79.899 Lipid panel    Prolactin    Hemoglobin A1c      Past Psychiatric History: I have reviewed past psychiatric history from progress note on 03/25/2019.  Past trials of medications like Invega Sustenna, risperidone.  Past Medical History:  Past Medical History:  Diagnosis Date   Allergy    Arthritis    Asthma    Cataract    Dementia (HCC)     GERD (gastroesophageal reflux disease)    pt. denied   Heart murmur    was told years ago   History of shingles 2006   Hyperlipidemia     Past Surgical History:  Procedure Laterality Date   ABDOMINAL HYSTERECTOMY     COLONOSCOPY     POLYPECTOMY     VAGINAL DELIVERY     x2    Family Psychiatric History: I have reviewed family psychiatric history from progress note on 03/25/2019.  Family History:  Family History  Problem Relation Age of Onset   Arthritis Mother    Diabetes Mother    Hypertension Mother    Liver cancer Mother    Colon cancer Father 66   Pancreatic cancer Brother    Colon polyps Brother    Breast cancer Daughter 75   Alcohol abuse Other    Heart disease Neg Hx    Esophageal cancer Neg Hx    Rectal cancer Neg Hx    Stomach cancer Neg Hx     Social History: I have reviewed social history from progress note on 03/25/2019. 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    Comment: retired  Tobacco  Use   Smoking status: Never    Passive exposure: Past   Smokeless tobacco: Never  Vaping Use   Vaping status: Never Used  Substance and Sexual Activity   Alcohol use: Not Currently   Drug use: No   Sexual activity: Not Currently  Other Topics Concern   Not on file  Social History Narrative   No living will   Requests daughter Cordelia Pen as health care POA   Would accept resuscitation but no prolonged machines   No prolonged tube feeds      Right handed      Completed HS   Social Determinants of Health   Financial Resource Strain: Low Risk  (03/25/2019)   Overall Financial Resource Strain (CARDIA)    Difficulty of Paying Living Expenses: Not very hard  Food Insecurity: Food Insecurity Present (03/25/2019)   Hunger Vital Sign     Worried About Running Out of Food in the Last Year: Sometimes true    Ran Out of Food in the Last Year: Sometimes true  Transportation Needs: Unmet Transportation Needs (03/25/2019)   PRAPARE - Administrator, Civil Service (Medical): Yes    Lack of Transportation (Non-Medical): Yes  Physical Activity: Inactive (03/25/2019)   Exercise Vital Sign    Days of Exercise per Week: 0 days    Minutes of Exercise per Session: 0 min  Stress: Not on file  Social Connections: Unknown (12/21/2021)   Received [facility]   Social Network    Social Network: Not on file    Allergies: No Known Allergies  Metabolic Disorder Labs: Lab Results  Component Value Date   HGBA1C 6.1 05/11/2022   Lab Results  Component Value Date   PROLACTIN 21.2 10/23/2020   Lab Results  Component Value Date   CHOL 232 (H) 05/28/2021   TRIG 152.0 (H) 05/28/2021   HDL 52.20 05/28/2021   CHOLHDL 4 05/28/2021   VLDL 30.4 05/28/2021   LDLCALC 149 (H) 05/28/2021   LDLCALC 173 (H) 05/27/2020   Lab Results  Component Value Date   TSH 0.88 05/11/2022   TSH 1.522 04/14/2022    Therapeutic Level Labs: No results found for: "LITHIUM" No results found for: "VALPROATE" No results found for: "CBMZ"  Current Medications: Current Outpatient Medications  Medication Sig Dispense Refill   albuterol (PROAIR HFA) 108 (90 Base) MCG/ACT inhaler Inhale 2 puffs into the lungs every 6 (six) hours as needed for wheezing or shortness of breath. 18 g 1   cetirizine (ZYRTEC) 10 MG tablet Take 10 mg by mouth daily.     Cholecalciferol (VITAMIN D3) 1000 units CAPS Take 1 capsule by mouth daily.     ibuprofen (ADVIL) 200 MG tablet Take 200 mg by mouth every 6 (six) hours as needed.     risperiDONE (RISPERDAL) 0.5 MG tablet TAKE 1 TABLET BY MOUTH AT BEDTIME 90 tablet 0   traZODone (DESYREL) 50 MG tablet TAKE 1/2 TO 1 (ONE-HALF TO ONE) TABLET BY MOUTH AT BEDTIME AS NEEDED FOR SLEEP 90 tablet 0   No  current facility-administered medications for this visit.     Musculoskeletal: Strength & Muscle Tone: within normal limits Gait & Station: normal Patient leans: N/A  Psychiatric Specialty Exam: Review of Systems  Psychiatric/Behavioral:  Positive for sleep disturbance.     Blood pressure 118/77, pulse 98, temperature 98.1 F (36.7 C), temperature source Skin, height 5\' 3"  (1.6 m), weight 202 lb 12.8 oz (92 kg).Body mass index is 35.92 kg/m.  General Appearance: Fairly Groomed  Eye Contact:  Fair  Speech:  Clear and Coherent  Volume:  Normal  Mood:  Euthymic  Affect:  Congruent  Thought Process:  Goal Directed and Descriptions of Associations: Intact  Orientation:  Full (Time, Place, and Person)  Thought Content: Logical   Suicidal Thoughts:  No  Homicidal Thoughts:  No  Memory:  Immediate;   Fair Recent;   Fair Remote;   Fair  Judgement:  Fair  Insight:  Fair  Psychomotor Activity:  Normal  Concentration:  Concentration: Fair and Attention Span: Fair  Recall:  [employer] of Knowledge: Fair  Language: Fair  Akathisia:  No  Handed:  Right  AIMS (if indicated): done  Assets:  Communication Skills Desire for Improvement Housing Social Support  ADL's:  Intact  Cognition: WNL  Sleep:   Restless patient does have sleep apnea noncompliant on CPAP at this time.   Screenings: AIMS    Flowsheet Row Office Visit from 03/22/2023 in [facility] Office Visit from 11/23/2022 in [facility] Office Visit from 08/24/2022 in [facility] Office Visit from 06/22/2022 in [facility] Office Visit from 05/10/2022 in [facility]  AIMS Total Score 0 0 0 0 0      AUDIT    Flowsheet Row Admission (Discharged) from 11/18/2019 in [facility] INPATIENT BEHAVIORAL MEDICINE  Alcohol Use Disorder  Identification Test Final Score (AUDIT) 0      GAD-7    Flowsheet Row Office Visit from 03/22/2023 in [facility] Office Visit from 11/23/2022 in [facility] Office Visit from 08/24/2022 in [facility] Office Visit from 05/10/2022 in [facility] Office Visit from 04/14/2022 in [facility]  Total GAD-7 Score 0 0 0 1 3      PHQ2-9    Flowsheet Row Office Visit from 03/22/2023 in [facility] Office Visit from 11/23/2022 in [facility] Office Visit from 08/24/2022 in [facility] Office Visit from 06/22/2022 in [facility] Office Visit from 06/06/2022 in [facility]  PHQ-2 Total Score 0 0 0 0 0  PHQ-9 Total Score -- 0 0 0 --      Flowsheet Row Office Visit from 03/22/2023 in [facility] Office Visit from 11/23/2022 in [facility] Office Visit from 08/24/2022 in [facility]  C-SSRS RISK CATEGORY No Risk No Risk No Risk        Assessment and Plan: Natalie [name] is a 72 year old African-American female with history of psychosis was evaluated in office today.  Patient with recent diagnosis of sleep apnea continues to have sleep problems currently awaiting CPAP otherwise doing fairly well on the current medication regimen.  Plan as noted below.  Plan Other specified schizophrenia spectrum and other psychotic disorder-stable Risperidone 0.5 mg p.o. nightly Trazodone 25-50 mg at bedtime as needed  Insomnia-stable Trazodone 25-50 mg p.o. nightly as needed.  Mild neurocognitive  disorder-stable SLUMS-06/22/2022- 28/30 MMSE - completed 03/22/2023- 30/30  High risk medication use-will order hemoglobin A1c, lipid panel, prolactin level.  Patient provided lab slip.  Follow-up in clinic in 5 to 6 months or sooner if  needed.   Collaboration of Care: Collaboration of Care: Other patient encouraged to follow-up with pulmonology-patient with OSA needs to be on CPAP.  Patient/Guardian was advised Release of Information must be obtained prior to any record release in order to collaborate their care with an outside provider. Patient/Guardian was advised if they have not already done so to contact the registration department to sign all necessary forms in order for [place] to release information regarding their care.   Consent: Patient/Guardian gives verbal consent for treatment and assignment of benefits for services provided during this visit. Patient/Guardian expressed understanding and agreed to proceed.   This note was generated in part or whole with voice recognition software. Voice recognition is usually quite accurate but there are transcription errors that can and very often do occur. I apologize for any typographical errors that were not detected and corrected.    Jomarie Longs, MD 03/23/2023, 5:55 PM

## 2023-03-23 ENCOUNTER — Encounter: Payer: Self-pay | Admitting: Internal Medicine

## 2023-03-23 ENCOUNTER — Ambulatory Visit: Payer: Medicare Other | Admitting: Internal Medicine

## 2023-03-23 VITALS — BP 128/82 | HR 100 | Temp 97.8°F | Ht 63.0 in | Wt 202.4 lb

## 2023-03-23 DIAGNOSIS — G4733 Obstructive sleep apnea (adult) (pediatric): Secondary | ICD-10-CM | POA: Diagnosis not present

## 2023-03-23 NOTE — Progress Notes (Signed)
[facility] Pulmonary Medicine Consultation      Date: 03/23/2023,   MRN# [id number] Natalie [name] 11/22/1950     CHIEF COMPLAINT:   Shortness of breath assessment for asthma Follow-up OSA    HISTORY OF PRESENT ILLNESS     72 year old African-American female with a history of asthma presents today with shortness of breath and history of palpitations along with a diagnosis of obstructive sleep apnea  Patient was diagnosed with obstructive sleep apnea in 2020 with AHI of 13 She underwent a CPAP titration study however did not follow-up with CPAP therapy due to cost  No exacerbation at this time No evidence of heart failure at this time No evidence or signs of infection at this time No respiratory distress No fevers, chills, nausea, vomiting, diarrhea No evidence of lower extremity edema No evidence hemoptysis   Uses Albuterol Inhaler as needed, very infrequent usage  Weighs 202 pounds at 5 feet 3 inches     PAST MEDICAL HISTORY   Past Medical History:  Diagnosis Date   Allergy    Arthritis    Asthma    Cataract    Dementia (HCC)    GERD (gastroesophageal reflux disease)    pt. denied   Heart murmur    was told years ago   History of shingles 2006   Hyperlipidemia      SURGICAL HISTORY   Past Surgical History:  Procedure Laterality Date   ABDOMINAL HYSTERECTOMY     COLONOSCOPY     POLYPECTOMY     VAGINAL DELIVERY     x2     FAMILY HISTORY   Family History  Problem Relation Age of Onset   Arthritis Mother    Diabetes Mother    Hypertension Mother    Liver cancer Mother    Colon cancer Father 35   Pancreatic cancer Brother    Colon polyps Brother    Breast cancer Daughter 23   Alcohol abuse Other    Heart disease Neg Hx    Esophageal cancer Neg Hx    Rectal cancer Neg Hx    Stomach cancer Neg Hx      SOCIAL HISTORY   Social History   Tobacco Use   Smoking status: Never    Passive exposure: Past   Smokeless tobacco: Never   Vaping Use   Vaping status: Never Used  Substance Use Topics   Alcohol use: Not Currently   Drug use: No     MEDICATIONS    Home Medication:  Current Outpatient Rx   Order #: [id number] Class: Normal   Order #: [id number] Class: Historical Med   Order #: [id number] Class: Historical Med   Order #: [id number] Class: Historical Med   Order #: [id number] Class: Normal   Order #: [id number] Class: Normal    Current Medication:  Current Outpatient Medications:    albuterol (PROAIR HFA) 108 (90 Base) MCG/ACT inhaler, Inhale 2 puffs into the lungs every 6 (six) hours as needed for wheezing or shortness of breath., Disp: 18 g, Rfl: 1   cetirizine (ZYRTEC) 10 MG tablet, Take 10 mg by mouth daily., Disp: , Rfl:    Cholecalciferol (VITAMIN D3) 1000 units CAPS, Take 1 capsule by mouth daily., Disp: , Rfl:    ibuprofen (ADVIL) 200 MG tablet, Take 200 mg by mouth every 6 (six) hours as needed., Disp: , Rfl:    risperiDONE (RISPERDAL) 0.5 MG tablet, TAKE 1 TABLET BY MOUTH AT BEDTIME, Disp: 90 tablet, Rfl: 0   traZODone (DESYREL) 50 MG tablet,  TAKE 1/2 TO 1 (ONE-HALF TO ONE) TABLET BY MOUTH AT BEDTIME AS NEEDED FOR SLEEP, Disp: 90 tablet, Rfl: 0    ALLERGIES   Patient has no known allergies.   BP 128/82 (BP Location: Left Arm, Cuff Size: Normal)   Pulse 100   Temp 97.8 F (36.6 C) (Temporal)   Ht 5\' 3"  (1.6 m)   Wt 202 lb 6.4 oz (91.8 kg)   SpO2 96%   BMI 35.85 kg/m      Review of Systems: Gen:  Denies  fever, sweats, chills weight loss  HEENT: Denies blurred vision, double vision, ear pain, eye pain, hearing loss, nose bleeds, sore throat Cardiac:  No dizziness, chest pain or heaviness, chest tightness,edema, No JVD Resp:   No cough, -sputum production, -shortness of breath,-wheezing, -hemoptysis,  Other:  All other systems negative   Physical Examination:   General Appearance: No distress  EYES PERRLA, EOM intact.   NECK Supple, No JVD Pulmonary: normal breath sounds, No  wheezing.  CardiovascularNormal S1,S2.  No m/r/g.   Abdomen: Benign, Soft, non-tender. Neurology UE/LE 5/5 strength, no focal deficits Ext pulses intact, cap refill intact ALL OTHER ROS ARE NEGATIVE       ASSESSMENT/PLAN   72 year old African-American female morbidly obese with obstructive sleep apnea associated with asthma mild intermittent at this time with deconditioned state   Asthma mild intermittent Use albuterol as needed Patient is well-controlled without any maintenance therapy at this time Recommend therapy for OSA as this would prevent asthma exacerbations However patient cannot afford her supplies and machine so I have asked her to call her insurance company and find out what they can cover  I highly recommend weight loss Patient is not a candidate for oral device or inspire device due to cost   Palpitations Follow-up echocardiogram with cardiology   Obesity -recommend significant weight loss -recommend changing diet  Deconditioned state -Recommend increased daily activity and exercise   Avoid secondhand smoke Avoid SICK contacts Recommend  Masking  when appropriate Recommend Keep up-to-date with vaccinations   CURRENT MEDICATIONS REVIEWED AT LENGTH WITH PATIENT TODAY   Patient  satisfied with Plan of action and management. All questions answered  Follow up 6 months  Total Time Spent 25 mins   Wallis Bamberg [name], M.D.  [facility]  Medical Director [facility] Medical Director [facility] Cardio-Pulmonary Department

## 2023-03-23 NOTE — Patient Instructions (Signed)
Recommend weight loss  Please call insurance company and let us know if they cover diagnosis of underlying sleep apnea  Continue albuterol as needed  Avoid secondhand smoke Avoid SICK contacts Recommend  Masking  when appropriate Recommend Keep up-to-date with vaccinations

## 2023-04-11 ENCOUNTER — Telehealth: Payer: Self-pay | Admitting: Internal Medicine

## 2023-04-11 NOTE — Telephone Encounter (Signed)
Spoke to patient. She stated that insurance will cover 80% of cpap. Insurance was not able to tell her the cost of machine, therefore she is not sure if this is affordable for her.   Routing to Dr. Belia Heman to make aware.

## 2023-04-11 NOTE — Telephone Encounter (Signed)
Patient states insurance will cover 80 % for CPAP machine. Patient phone number is [phone].

## 2023-05-16 ENCOUNTER — Other Ambulatory Visit: Payer: Self-pay | Admitting: Psychiatry

## 2023-05-16 DIAGNOSIS — G4701 Insomnia due to medical condition: Secondary | ICD-10-CM

## 2023-05-16 DIAGNOSIS — F29 Unspecified psychosis not due to a substance or known physiological condition: Secondary | ICD-10-CM

## 2023-06-08 ENCOUNTER — Encounter: Payer: Self-pay | Admitting: Internal Medicine

## 2023-06-08 ENCOUNTER — Ambulatory Visit: Payer: Medicare Other | Admitting: Internal Medicine

## 2023-06-08 VITALS — BP 110/80 | HR 102 | Temp 98.9°F | Ht 62.75 in | Wt 202.0 lb

## 2023-06-08 DIAGNOSIS — G4739 Other sleep apnea: Secondary | ICD-10-CM | POA: Diagnosis not present

## 2023-06-08 DIAGNOSIS — R0609 Other forms of dyspnea: Secondary | ICD-10-CM

## 2023-06-08 DIAGNOSIS — Z1159 Encounter for screening for other viral diseases: Secondary | ICD-10-CM | POA: Diagnosis not present

## 2023-06-08 DIAGNOSIS — Z23 Encounter for immunization: Secondary | ICD-10-CM

## 2023-06-08 DIAGNOSIS — F29 Unspecified psychosis not due to a substance or known physiological condition: Secondary | ICD-10-CM | POA: Diagnosis not present

## 2023-06-08 DIAGNOSIS — G4701 Insomnia due to medical condition: Secondary | ICD-10-CM | POA: Diagnosis not present

## 2023-06-08 DIAGNOSIS — Z0000 Encounter for general adult medical examination without abnormal findings: Secondary | ICD-10-CM | POA: Diagnosis not present

## 2023-06-08 LAB — COMPREHENSIVE METABOLIC PANEL
ALT: 12 U/L (ref 0–35)
AST: 18 U/L (ref 0–37)
Albumin: 4.2 g/dL (ref 3.5–5.2)
Alkaline Phosphatase: 62 U/L (ref 39–117)
BUN: 10 mg/dL (ref 6–23)
CO2: 31 meq/L (ref 19–32)
Calcium: 9.5 mg/dL (ref 8.4–10.5)
Chloride: 103 meq/L (ref 96–112)
Creatinine, Ser: 0.97 mg/dL (ref 0.40–1.20)
GFR: 57.6 mL/min — ABNORMAL LOW (ref >60.00)
Glucose, Bld: 105 mg/dL — ABNORMAL HIGH (ref 70–99)
Potassium: 3.9 meq/L (ref 3.5–5.1)
Sodium: 142 meq/L (ref 135–145)
Total Bilirubin: 0.6 mg/dL (ref 0.2–1.2)
Total Protein: 7.2 g/dL (ref 6.0–8.3)

## 2023-06-08 LAB — CBC
HCT: 46.2 % — ABNORMAL HIGH (ref 36.0–46.0)
Hemoglobin: 14.8 g/dL (ref 12.0–15.0)
MCHC: 32.1 g/dL (ref 30.0–36.0)
MCV: 92.9 fL (ref 78.0–100.0)
Platelets: 258 10*3/uL (ref 150.0–400.0)
RBC: 4.97 Mil/uL (ref 3.87–5.11)
RDW: 13.8 % (ref 11.5–15.5)
WBC: 3.8 10*3/uL — ABNORMAL LOW (ref 4.0–10.5)

## 2023-06-08 LAB — LIPID PANEL
Cholesterol: 245 mg/dL — ABNORMAL HIGH (ref 0–200)
HDL: 56.5 mg/dL (ref >39.00)
LDL Cholesterol: 150 mg/dL — ABNORMAL HIGH (ref 0–99)
NonHDL: 188.94
Total CHOL/HDL Ratio: 4
Triglycerides: 195 mg/dL — ABNORMAL HIGH (ref 0.0–149.0)
VLDL: 39 mg/dL (ref 0.0–40.0)

## 2023-06-08 LAB — HEMOGLOBIN A1C: Hgb A1c MFr Bld: 6 % (ref 4.6–6.5)

## 2023-06-08 LAB — TSH: TSH: 1.74 u[IU]/mL (ref 0.35–5.50)

## 2023-06-08 NOTE — Progress Notes (Signed)
Yes changed to a 7 year interval for 08/2024. Thanks

## 2023-06-08 NOTE — Progress Notes (Signed)
Subjective:    Patient ID: Natalie [name], female    DOB: 11-Mar-1951, 72 y.o.   MRN: [id number]  HPI Here for Medicare wellness visit and follow up of chronic health conditions Reviewed advanced directives Reviewed other doctors---Dr Marcy Panning, Dr Eappen--psychiatry, Dr Agbor-Etang--cardiology, [facility], Dr Janann August No hospitalizations or surgery No exercise---knows he she has to start walking, etc Vision is okay--has exam coming up Hearing is fine No alcohol or tobacco No falls No depression or anhedonia Independent with instrumental ADLs Some memory issues---mostly recall  No hallucinations now Continues on low dose risperidone Sees Dr Sunday Shams A1c  Had cardiology work up Echo was normal Does get tired----some DOE but no worse Has OSA---no CPAP (working on financing) No chest pain  No palpitations  No dizziness or sycncope  Current Outpatient Medications on File Prior to Visit  Medication Sig Dispense Refill   albuterol (PROAIR HFA) 108 (90 Base) MCG/ACT inhaler Inhale 2 puffs into the lungs every 6 (six) hours as needed for wheezing or shortness of breath. 18 g 1   cetirizine (ZYRTEC) 10 MG tablet Take 10 mg by mouth daily.     Cholecalciferol (VITAMIN D3) 1000 units CAPS Take 1 capsule by mouth daily.     ibuprofen (ADVIL) 200 MG tablet Take 200 mg by mouth every 6 (six) hours as needed.     risperiDONE (RISPERDAL) 0.5 MG tablet TAKE 1 TABLET BY MOUTH AT BEDTIME 90 tablet 3   traZODone (DESYREL) 50 MG tablet TAKE 1/2 TO 1 (ONE-HALF TO ONE) TABLET BY MOUTH AT BEDTIME AS NEEDED FOR SLEEP 90 tablet 3   No current facility-administered medications on file prior to visit.    No Known Allergies  Past Medical History:  Diagnosis Date   Allergy    Arthritis    Asthma    Cataract    Dementia (HCC)    GERD (gastroesophageal reflux disease)    pt. denied   Heart murmur    was told years ago   History of shingles 2006   Hyperlipidemia      Past Surgical History:  Procedure Laterality Date   ABDOMINAL HYSTERECTOMY     COLONOSCOPY     POLYPECTOMY     VAGINAL DELIVERY     x2    Family History  Problem Relation Age of Onset   Arthritis Mother    Diabetes Mother    Hypertension Mother    Liver cancer Mother    Colon cancer Father 63   Pancreatic cancer Brother    Colon polyps Brother    Breast cancer Daughter 13   Alcohol abuse Other    Heart disease Neg Hx    Esophageal cancer Neg Hx    Rectal cancer Neg Hx    Stomach cancer Neg Hx     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    Comment: retired  Tobacco Use   Smoking status: Never    Passive exposure: Past   Smokeless tobacco: Never  Vaping Use   Vaping status: Never Used  Substance and Sexual Activity   Alcohol use: Not Currently   Drug use: No   Sexual activity: Not Currently  Other Topics Concern   Not on file  Social  History Narrative   No living will   Requests daughter Cordelia Pen as health care POA   Would accept resuscitation but no prolonged machines   No prolonged tube feeds--would leave to daughter      Right handed      Completed HS   Social Determinants of Health   Financial Resource Strain: Low Risk  (03/25/2019)   Overall Financial Resource Strain (CARDIA)    Difficulty of Paying Living Expenses: Not very hard  Food Insecurity: Food Insecurity Present (03/25/2019)   Hunger Vital Sign    Worried About Running Out of Food in the Last Year: Sometimes true    Ran Out of Food in the Last Year: Sometimes true  Transportation Needs: Unmet Transportation Needs (03/25/2019)   PRAPARE - Administrator, Civil Service (Medical): Yes    Lack of Transportation (Non-Medical): Yes  Physical  Activity: Inactive (03/25/2019)   Exercise Vital Sign    Days of Exercise per Week: 0 days    Minutes of Exercise per Session: 0 min  Stress: Not on file  Social Connections: Unknown (12/21/2021)   Received from [facility], [facility]   Social Network    Social Network: Not on file  Intimate Partner Violence: Unknown (11/11/2021)   Received from [employer], [facility]   HITS    Physically Hurt: Not on file    Insult or Talk Down To: Not on file    Threaten Physical Harm: Not on file    Scream or Curse: Not on file   Review of Systems Appetite is fine Has gained some weight--discussed Sleeps fairly well--uses the trazodone Wears seat belt Teeth okay---keeps up with dentist Wants dark spot on neck checked--doesn't see derm Bowels better with increased fiber intake No urinary problems No sig back or joint pains    Objective:   Physical Exam Constitutional:      Appearance: Normal appearance.  HENT:     Mouth/Throat:     Pharynx: No oropharyngeal exudate or posterior oropharyngeal erythema.  Eyes:     Conjunctiva/sclera: Conjunctivae normal.     Pupils: Pupils are equal, round, and reactive to light.  Cardiovascular:     Rate and Rhythm: Normal rate and regular rhythm.     Pulses: Normal pulses.     Heart sounds: No murmur heard.    No gallop.  Pulmonary:     Effort: Pulmonary effort is normal.     Breath sounds: Normal breath sounds. No wheezing or rales.  Abdominal:     Palpations: Abdomen is soft.     Tenderness: There is no abdominal tenderness.  Musculoskeletal:     Cervical back: Neck supple.     Right lower leg: No edema.     Left lower leg: No edema.  Lymphadenopathy:     Cervical: No cervical adenopathy.  Skin:    Findings: No rash.  Neurological:     General: No focal deficit present.     Mental Status: She is alert and oriented to person, place, and time.     Comments: Word naming-- 12/1 minute Recall 3/3  Psychiatric:        Mood and  Affect: Mood normal.        Behavior: Behavior normal.            Assessment & Plan:

## 2023-06-08 NOTE — Assessment & Plan Note (Signed)
Sleeps okay with trazodone 25-50 nightly

## 2023-06-08 NOTE — Assessment & Plan Note (Signed)
Hallucinations controlled with risperdal 0.5mg  daily Past concern for Lewy body dementia fortunately has not borne out

## 2023-06-08 NOTE — Assessment & Plan Note (Signed)
Hasn't started CPAP

## 2023-06-08 NOTE — Patient Instructions (Signed)
Please get your COVID vaccine--and also a tetanus booster.

## 2023-06-08 NOTE — Assessment & Plan Note (Signed)
I have personally reviewed the Medicare Annual Wellness questionnaire and have noted 1. The patient's medical and social history 2. Their use of alcohol, tobacco or illicit drugs 3. Their current medications and supplements 4. The patient's functional ability including ADL's, fall risks, home safety risks and hearing or visual             impairment. 5. Diet and physical activities 6. Evidence for depression or mood disorders  The patients weight, height, BMI and visual acuity have been recorded in the chart I have made referrals, counseling and provided education to the patient based review of the above and I have provided the pt with a written personalized care plan for preventive services.  I have provided you with a copy of your personalized plan for preventive services. Please take the time to review along with your updated medication list.  Yearly mammogram till at least 73--due December Colon due--will check with Dr Russella Dar (may be changed to 2026) Discussed exercise Flu vaccine today COVID and tetanus at pharmacy Consider shingrix

## 2023-06-08 NOTE — Assessment & Plan Note (Signed)
Echo was reassuring Pulmonary visit suggested deconditioning and weight as issues

## 2023-06-08 NOTE — Progress Notes (Signed)
Hearing Screening - Comments:: Passed whisper test Vision Screening - Comments:: December 2023

## 2023-06-09 LAB — PROLACTIN: Prolactin: 10.5 ng/mL

## 2023-06-09 LAB — HEPATITIS C ANTIBODY: Hepatitis C Ab: NONREACTIVE

## 2023-06-12 ENCOUNTER — Other Ambulatory Visit: Payer: Self-pay | Admitting: Internal Medicine

## 2023-06-12 DIAGNOSIS — Z1231 Encounter for screening mammogram for malignant neoplasm of breast: Secondary | ICD-10-CM

## 2023-06-16 NOTE — Progress Notes (Signed)
Noted  

## 2023-07-17 ENCOUNTER — Ambulatory Visit
Admission: RE | Admit: 2023-07-17 | Discharge: 2023-07-17 | Disposition: A | Discharge status: Home / Self Care | Payer: Medicare Other | Source: Ambulatory Visit | Attending: Internal Medicine | Admitting: Internal Medicine

## 2023-07-17 DIAGNOSIS — Z1231 Encounter for screening mammogram for malignant neoplasm of breast: Secondary | ICD-10-CM | POA: Insufficient documentation

## 2023-08-15 ENCOUNTER — Encounter: Payer: Self-pay | Admitting: Family Medicine

## 2023-08-15 ENCOUNTER — Ambulatory Visit: Payer: Medicare Other | Admitting: Psychiatry

## 2023-08-15 ENCOUNTER — Ambulatory Visit: Payer: Self-pay | Admitting: Internal Medicine

## 2023-08-15 ENCOUNTER — Encounter: Payer: Self-pay | Admitting: Psychiatry

## 2023-08-15 ENCOUNTER — Ambulatory Visit (INDEPENDENT_AMBULATORY_CARE_PROVIDER_SITE_OTHER): Payer: Medicare Other | Admitting: Family Medicine

## 2023-08-15 VITALS — BP 132/80 | HR 76 | Temp 98.4°F | Ht 62.75 in | Wt 204.0 lb

## 2023-08-15 VITALS — BP 131/79 | HR 89 | Temp 97.9°F | Ht 72.0 in | Wt 204.2 lb

## 2023-08-15 DIAGNOSIS — J0100 Acute maxillary sinusitis, unspecified: Secondary | ICD-10-CM | POA: Diagnosis not present

## 2023-08-15 DIAGNOSIS — F0670 Mild neurocognitive disorder due to known physiological condition without behavioral disturbance: Secondary | ICD-10-CM | POA: Diagnosis not present

## 2023-08-15 DIAGNOSIS — F28 Other psychotic disorder not due to a substance or known physiological condition: Secondary | ICD-10-CM

## 2023-08-15 DIAGNOSIS — G4701 Insomnia due to medical condition: Secondary | ICD-10-CM | POA: Diagnosis not present

## 2023-08-15 MED ORDER — AMOXICILLIN 500 MG PO CAPS: 1000.0000 mg | ORAL_CAPSULE | Freq: Two times a day (BID) | ORAL | 0 refills | Status: DC

## 2023-08-15 NOTE — Telephone Encounter (Signed)
 Noted.

## 2023-08-15 NOTE — Patient Instructions (Signed)
 Start flnoase 2 sprays per nostril daily.  Start nasal saline spray or irrigation 1-3 times daily.  If not improving complete a course of amoxicillin x 10 days.  Follow up if not improving as excepted.

## 2023-08-15 NOTE — Progress Notes (Signed)
 Patient ID: Natalie [name], female    DOB: 11/08/1950, 73 y.o.   MRN: [id number]  This visit was conducted in person.  BP 132/80 (BP Location: Left Arm, Patient Position: Sitting, Cuff Size: Large)   Pulse 76   Temp 98.4 F (36.9 C) (Temporal)   Ht 5' 2.75 (1.594 m)   Wt 204 lb (92.5 kg)   SpO2 97%   BMI 36.43 kg/m    CC:  Chief Complaint  Patient presents with   Sinus Drainage    Blood tinged mucus Had cold 4 weeks ago    Subjective:   HPI: Natalie [name] is a 73 y.o. female presenting on 08/15/2023 for Sinus Drainage (Blood tinged mucus/Had cold 4 weeks ago)   Date of onset:  4 weeks ago Initial symptoms included  nasal congestion, cough... improved but now with ligering symptoms Symptoms progressed to lingering post nasal drip, no headache, no face pain  Productive nasal .. Thick mucus with blood tinge.  Needing to clear throat.   No SOB, no wheeze    Sick contacts:  none COVID testing:   none     She has tried to treat with  vicks  nasal inhaler, robitussin     Has history of chronic lung disease, mild intermittent asthma. Non-smoker.       Relevant past medical, surgical, family and social history reviewed and updated as indicated. Interim medical history since our last visit reviewed. Allergies and medications reviewed and updated. Outpatient Medications Prior to Visit  Medication Sig Dispense Refill   albuterol  (PROAIR  HFA) 108 (90 Base) MCG/ACT inhaler Inhale 2 puffs into the lungs every 6 (six) hours as needed for wheezing or shortness of breath. 18 g 1   cetirizine (ZYRTEC) 10 MG tablet Take 10 mg by mouth daily.     Cholecalciferol (VITAMIN D3) 1000 units CAPS Take 1 capsule by mouth daily.     ibuprofen (ADVIL) 200 MG tablet Take 200 mg by mouth every 6 (six) hours as needed.     risperiDONE  (RISPERDAL ) 0.5 MG tablet TAKE 1 TABLET BY MOUTH AT BEDTIME 90 tablet 3   traZODone  (DESYREL ) 50 MG tablet TAKE 1/2 TO 1 (ONE-HALF TO ONE) TABLET BY  MOUTH AT BEDTIME AS NEEDED FOR SLEEP 90 tablet 3   No facility-administered medications prior to visit.     Per HPI unless specifically indicated in ROS section below Review of Systems  Constitutional:  Negative for fatigue and fever.  HENT:  Positive for nosebleeds, postnasal drip, sinus pressure and sinus pain. Negative for congestion, ear pain and sore throat.   Eyes:  Negative for pain.  Respiratory:  Negative for cough and shortness of breath.   Cardiovascular:  Negative for chest pain, palpitations and leg swelling.  Gastrointestinal:  Negative for abdominal pain.  Genitourinary:  Negative for dysuria and vaginal bleeding.  Musculoskeletal:  Negative for back pain.  Neurological:  Negative for syncope, light-headedness and headaches.  Psychiatric/Behavioral:  Negative for dysphoric mood.    Objective:  BP 132/80 (BP Location: Left Arm, Patient Position: Sitting, Cuff Size: Large)   Pulse 76   Temp 98.4 F (36.9 C) (Temporal)   Ht 5' 2.75 (1.594 m)   Wt 204 lb (92.5 kg)   SpO2 97%   BMI 36.43 kg/m   Wt Readings from Last 3 Encounters:  08/15/23 204 lb (92.5 kg)  06/08/23 202 lb (91.6 kg)  03/23/23 202 lb 6.4 oz (91.8 kg)      Physical  Exam Constitutional:      General: She is not in acute distress.    Appearance: Normal appearance. She is well-developed. She is not ill-appearing or toxic-appearing.  HENT:     Head: Normocephalic.     Right Ear: Hearing, ear canal and external ear normal. A middle ear effusion is present. Tympanic membrane is not erythematous, retracted or bulging.     Left Ear: Hearing, ear canal and external ear normal. A middle ear effusion is present. Tympanic membrane is not erythematous, retracted or bulging.     Nose: No mucosal edema or rhinorrhea.     Right Turbinates: Swollen.     Left Turbinates: Swollen.     Right Sinus: No maxillary sinus tenderness or frontal sinus tenderness.     Left Sinus: No maxillary sinus tenderness or frontal  sinus tenderness.     Mouth/Throat:     Pharynx: Uvula midline.  Eyes:     General: Lids are normal. Lids are everted, no foreign bodies appreciated.     Conjunctiva/sclera: Conjunctivae normal.     Pupils: Pupils are equal, round, and reactive to light.  Neck:     Thyroid : No thyroid  mass or thyromegaly.     Vascular: No carotid bruit.     Trachea: Trachea normal.  Cardiovascular:     Rate and Rhythm: Normal rate and regular rhythm.     Pulses: Normal pulses.     Heart sounds: Normal heart sounds, S1 normal and S2 normal. No murmur heard.    No friction rub. No gallop.  Pulmonary:     Effort: Pulmonary effort is normal. No tachypnea or respiratory distress.     Breath sounds: Normal breath sounds. No decreased breath sounds, wheezing, rhonchi or rales.  Abdominal:     General: Bowel sounds are normal.     Palpations: Abdomen is soft.     Tenderness: There is no abdominal tenderness.  Musculoskeletal:     Cervical back: Normal range of motion and neck supple.  Skin:    General: Skin is warm and dry.     Findings: No rash.  Neurological:     Mental Status: She is alert.  Psychiatric:        Mood and Affect: Mood is not anxious or depressed.        Speech: Speech normal.        Behavior: Behavior normal. Behavior is cooperative.        Thought Content: Thought content normal.        Judgment: Judgment normal.       Results for orders placed or performed in visit on 06/08/23  Hepatitis C Antibody   Collection Time: 06/08/23  9:19 AM  Result Value Ref Range   Hepatitis C Ab NON-REACTIVE NON-REACTIVE  Hemoglobin A1c   Collection Time: 06/08/23  9:19 AM  Result Value Ref Range   Hgb A1c MFr Bld 6.0 4.6 - 6.5 %  Comprehensive metabolic panel   Collection Time: 06/08/23  9:19 AM  Result Value Ref Range   Sodium 142 135 - 145 mEq/L   Potassium 3.9 3.5 - 5.1 mEq/L   Chloride 103 96 - 112 mEq/L   CO2 31 19 - 32 mEq/L   Glucose, Bld 105 (H) 70 - 99 mg/dL   BUN 10 6 - 23  mg/dL   Creatinine, Ser 9.02 0.40 - 1.20 mg/dL   Total Bilirubin 0.6 0.2 - 1.2 mg/dL   Alkaline Phosphatase 62 39 - 117 U/L   AST  18 0 - 37 U/L   ALT 12 0 - 35 U/L   Total Protein 7.2 6.0 - 8.3 g/dL   Albumin 4.2 3.5 - 5.2 g/dL   GFR 42.39 (L) >39.99 mL/min   Calcium 9.5 8.4 - 10.5 mg/dL  CBC   Collection Time: 06/08/23  9:19 AM  Result Value Ref Range   WBC 3.8 (L) 4.0 - 10.5 K/uL   RBC 4.97 3.87 - 5.11 Mil/uL   Platelets 258.0 150.0 - 400.0 K/uL   Hemoglobin 14.8 12.0 - 15.0 g/dL   HCT 53.7 (H) 63.9 - 53.9 %   MCV 92.9 78.0 - 100.0 fl   MCHC 32.1 30.0 - 36.0 g/dL   RDW 86.1 88.4 - 84.4 %  TSH   Collection Time: 06/08/23  9:19 AM  Result Value Ref Range   TSH 1.74 0.35 - 5.50 uIU/mL  Lipid panel   Collection Time: 06/08/23  9:19 AM  Result Value Ref Range   Cholesterol 245 (H) 0 - 200 mg/dL   Triglycerides 804.9 (H) 0.0 - 149.0 mg/dL   HDL 43.49 >60.99 mg/dL   VLDL 60.9 0.0 - 59.9 mg/dL   LDL Cholesterol 849 (H) 0 - 99 mg/dL   Total CHOL/HDL Ratio 4    NonHDL 188.94   Prolactin   Collection Time: 06/08/23  9:19 AM  Result Value Ref Range   Prolactin 10.5 ng/mL    Assessment and Plan  Acute non-recurrent maxillary sinusitis Assessment & Plan:  Start flnoase 2 sprays per nostril daily.  Start nasal saline spray or irrigation 1-3 times daily.  If not improving complete a course of amoxicillin  x 10 days.  Follow up if not improving as excepted.   Other orders -     Amoxicillin ; Take 2 capsules (1,000 mg total) by mouth 2 (two) times daily.  Dispense: 40 capsule; Refill: 0    Return if symptoms worsen or fail to improve.   Greig Ring, MD

## 2023-08-15 NOTE — Telephone Encounter (Signed)
 Copied from CRM [id number]. Topic: Clinical - Red Word Triage >> Aug 15, 2023 12:10 PM Fredrica W wrote: Red Word that prompted transfer to Nurse Triage: Blood in drainage from nose and mouth. Can't sleep at night. Backed up in chest.   Chief Complaint: blood in mucus from nose and mouth Symptoms: streaks of blood in mucus from nose and when clear throat to mucus from nose out of mouth, occasional cough, congestion Frequency: intermittent Pertinent Negatives: Patient denies coughing up blood, current wheezing, SOB, chest pain, runny nose Disposition: [] ED /[] Urgent Care (no appt availability in office) / [x] Appointment(In office/virtual)/ []  [facility]/ [] Home Care/ [] Refused Recommended Disposition /[] [facility]/ []  Follow-up with PCP Additional Notes: Pt reporting that over past week or so she's found streaks of blood in her mucus coming from her nose and out of her mouth intermittently. Pt confirms no bleeding in her mouth, pt clarifying that mucus is draining back and nagging in chest, draining it, and gets just streaks of blood in mucus just when trying to get throat and stuff cleared. Pt denies coughing up blood, reporting not that much coughing. Pt reporting the mucus today is yellowish with streaks of blood. Pt reporting she had cold few weeks back, never did clear on up, took mucinex and robitussin dm. Pt confirms no fever and runny nose. Pt confirms no current wheezing over past couple days but reporting wheezing when ill recently with cold. Advised appt today, scheduled with PCP office. Pt verbalized understanding to call back if worsening symptoms.  Reason for Disposition  [1] Has underlying lung disease (e.g., COPD, chronic bronchitis or emphysema) AND [2] sputum has turned yellow or green in color  Answer Assessment - Initial Assessment Questions 1. ONSET: When did the cough begin?      Noticed some last week and Sunday, saw it twice Sunday then  again this morning 2. SEVERITY: How bad is the cough today? Did the blood appear after a coughing spell?      Not that much coughing 3. SPUTUM: Describe the color of your sputum (none, dry cough; clear, white, yellow, green)     Sunday the rest was clear, now a little color to it, light yellow color 4. HEMOPTYSIS: How much blood? (flecks, streaks, tablespoons, etc.)     Streaks in mucus from nose and through mouth from nose that she brings up 5. DIFFICULTY BREATHING: Are you having difficulty breathing? If Yes, ask: How bad is it? (e.g., mild, moderate, severe)    - MILD: No SOB at rest, mild SOB with walking, speaks normally in sentences, can lie down, no retractions, pulse < 100.    - MODERATE: SOB at rest, SOB with minimal exertion and prefers to sit, cannot lie down flat, speaks in phrases, mild retractions, audible wheezing, pulse 100-120.    - SEVERE: Very SOB at rest, speaks in single words, struggling to breathe, sitting hunched forward, retractions, pulse > 120      denies 6. FEVER: Do you have a fever? If Yes, ask: What is your temperature, how was it measured, and when did it start?     denies 7. CARDIAC HISTORY: Do you have any history of heart disease? (e.g., heart attack, congestive heart failure)      denies 8. LUNG HISTORY: Do you have any history of lung disease?  (e.g., pulmonary embolus, asthma, emphysema)     asthma 9. PE RISK FACTORS: Do you have a history of blood clots? (or: recent major  surgery, recent prolonged travel, bedridden)     denies 10. OTHER SYMPTOMS: Do you have any other symptoms? (e.g., runny nose, wheezing, chest pain)       Wheezing when had cold, sometimes wheeze with cough, not the past couple days. Denies runny nose. More congestion  Protocols used: Coughing Up Blood-A-AH

## 2023-08-15 NOTE — Progress Notes (Signed)
 [facility] MD OP Progress Note  08/15/2023 2:52 PM Natalie [name]  MRN:  [id number]  Chief Complaint:  Chief Complaint  Patient presents with   Follow-up   Depression   Anxiety   Medication Refill   HPI: Natalie [name] is a 73 year old African-American female, lives in [place], history of psychosis, was evaluated in office today for medication management.  The patient reports sleep is restless last night due to her sinus infection.  She also has a history of sleep apnea and continues to be noncompliant with CPAP.  She is currently compliant on trazodone  as needed which does help.  Previously, the patient had a cold about four weeks ago, for which she took  Mucinex. While the medication provided some relief, it did not completely clear the symptoms.  She has upcoming appointment this afternoon with her primary care provider for evaluation and management of her symptoms.  The patient is currently on risperidone  0.5 at bedtime and trazodone , with no reported side effects such as muscle spasms, rigidity, or tremors. She denies any changes in her memory and has not noticed any significant weight changes, aside from a slight gain of a couple of pounds.  The patient's recent lab results showed high cholesterol and triglycerides, and she has been advised to make dietary changes, particularly reducing her intake of bacon. She has not been prescribed any cholesterol medication. Her thyroid  function is normal, and her hemoglobin A1c is satisfactory.   Patient currently denies any suicidality, homicidality.  Did not appear to be paranoid or delusional.  Denies any other perceptual disturbances.  Overall currently reports mood symptoms are stable.  Patient appeared to be alert, oriented to person place time situation.  Denies any memory changes.  Denies any other concerns today.    Visit Diagnosis:    ICD-10-CM   1. 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    2. Mild neurocognitive disorder due to multiple etiologies  F06.70     3. Insomnia due to medical condition  G47.01    Obstructive sleep apnea noncompliant with CPAP      Past Psychiatric History: I have reviewed past psychiatric history from progress note on 03/25/2019.  Past trials of medications like Invega Sustenna, risperidone .  Past Medical History:  Past Medical History:  Diagnosis Date   Allergy    Arthritis    Asthma    Cataract    Dementia (HCC)    GERD (gastroesophageal reflux disease)    pt. denied   Heart murmur    was told years ago   History of shingles 2006   Hyperlipidemia     Past Surgical History:  Procedure Laterality Date   ABDOMINAL HYSTERECTOMY     COLONOSCOPY     POLYPECTOMY     VAGINAL DELIVERY     x2    Family Psychiatric History: I have reviewed family psychiatric history from progress note on 03/25/2019.  Family History:  Family History  Problem Relation Age of Onset   Arthritis Mother    Diabetes Mother    Hypertension Mother    Liver cancer Mother    Colon cancer Father 58   Pancreatic cancer Brother    Colon polyps Brother    Breast cancer Daughter 5   Alcohol abuse Other    Heart disease Neg Hx    Esophageal cancer Neg Hx  Rectal cancer Neg Hx    Stomach cancer Neg Hx     Social History: I have reviewed social history from progress note on 03/25/2019. Social History   Socioeconomic History   Marital status: Single    Spouse name: Not on file   Number of children: 2   Years of education: Not on file   Highest education level: Not on file  Occupational History   Occupation: physicist, [employer]    Comment: retired  Tobacco Use   Smoking status: Never    Passive exposure: Past   Smokeless tobacco: Never  Vaping Use   Vaping status: Never Used  Substance and Sexual Activity    Alcohol use: Not Currently   Drug use: No   Sexual activity: Not Currently  Other Topics Concern   Not on file  Social History Narrative   No living will   Requests daughter Natalie [name] as health care POA   Would accept resuscitation but no prolonged machines   No prolonged tube feeds--would leave to daughter      Right handed      Completed HS   Social Drivers of Health   Financial Resource Strain: Low Risk  (03/25/2019)   Overall Financial Resource Strain (CARDIA)    Difficulty of Paying Living Expenses: Not very hard  Food Insecurity: Food Insecurity Present (03/25/2019)   Hunger Vital Sign    Worried About Running Out of Food in the Last Year: Sometimes true    Ran Out of Food in the Last Year: Sometimes true  Transportation Needs: Unmet Transportation Needs (03/25/2019)   PRAPARE - Transportation    Lack of Transportation (Medical): Yes    Lack of Transportation (Non-Medical): Yes  Physical Activity: Inactive (03/25/2019)   Exercise Vital Sign    Days of Exercise per Week: 0 days    Minutes of Exercise per Session: 0 min  Stress: Not on file  Social Connections: Unknown (12/21/2021)   [facility] from [facility]   Social Network    Social Network: Not on file    Allergies: No Known Allergies  Metabolic Disorder Labs: Lab Results  Component Value Date   HGBA1C 6.0 06/08/2023   Lab Results  Component Value Date   PROLACTIN 10.5 06/08/2023   PROLACTIN 21.2 10/23/2020   Lab Results  Component Value Date   CHOL 245 (H) 06/08/2023   TRIG 195.0 (H) 06/08/2023   HDL 56.50 06/08/2023   CHOLHDL 4 06/08/2023   VLDL 39.0 06/08/2023   LDLCALC 150 (H) 06/08/2023   LDLCALC 149 (H) 05/28/2021   Lab Results  Component Value Date   TSH 1.74 06/08/2023   TSH 0.88 05/11/2022    Therapeutic Level Labs: No results found for: LITHIUM No results found for: VALPROATE No results found for: CBMZ  Current Medications: Current Outpatient Medications   Medication Sig Dispense Refill   albuterol  (PROAIR  HFA) 108 (90 Base) MCG/ACT inhaler Inhale 2 puffs into the lungs every 6 (six) hours as needed for wheezing or shortness of breath. 18 g 1   cetirizine (ZYRTEC) 10 MG tablet Take 10 mg by mouth daily.     Cholecalciferol (VITAMIN D3) 1000 units CAPS Take 1 capsule by mouth daily.     ibuprofen (ADVIL) 200 MG tablet Take 200 mg by mouth every 6 (six) hours as needed.     risperiDONE  (RISPERDAL ) 0.5 MG tablet TAKE 1 TABLET BY MOUTH AT BEDTIME 90 tablet 3   traZODone  (DESYREL ) 50 MG tablet  TAKE 1/2 TO 1 (ONE-HALF TO ONE) TABLET BY MOUTH AT BEDTIME AS NEEDED FOR SLEEP 90 tablet 3   amoxicillin  (AMOXIL ) 500 MG capsule Take 2 capsules (1,000 mg total) by mouth 2 (two) times daily. 40 capsule 0   No current facility-administered medications for this visit.     Musculoskeletal: Strength & Muscle Tone: within normal limits Gait & Station: normal Patient leans: N/A  Psychiatric Specialty Exam: Review of Systems  HENT:  Positive for postnasal drip and sinus pressure.   Psychiatric/Behavioral: Negative.      Blood pressure 131/79, pulse 89, temperature 97.9 F (36.6 C), temperature source Skin, height 6' (1.829 m), weight 204 lb 3.2 oz (92.6 kg).Body mass index is 27.69 kg/m.  General Appearance: Fairly Groomed  Eye Contact:  Good  Speech:  Normal Rate  Volume:  Normal  Mood:  Euthymic  Affect:  Congruent  Thought Process:  Goal Directed and Descriptions of Associations: Intact  Orientation:  Full (Time, Place, and Person)  Thought Content: Logical   Suicidal Thoughts:  No  Homicidal Thoughts:  No  Memory:  Immediate;   Fair Recent;   Fair Remote;   Fair  Judgement:  Intact  Insight:  Fair  Psychomotor Activity:  Normal  Concentration:  Concentration: Fair and Attention Span: Fair  Recall:  [employer] of Knowledge: Fair  Language: Fair  Akathisia:  No  Handed:  Right  AIMS (if indicated): done  Assets:  Communication  Skills Desire for Improvement Housing Social Support  ADL's:  Intact  Cognition: WNL  Sleep:   restless last night due to sinusitis   Screenings: AIMS    Flowsheet Row Office Visit from 08/15/2023 in [facility] Office Visit from 03/22/2023 in [facility] Office Visit from 11/23/2022 in [facility] Office Visit from 08/24/2022 in [facility] Office Visit from 06/22/2022 in [facility]  AIMS Total Score 0 0 0 0 0      AUDIT    Flowsheet Row Admission (Discharged) from 11/18/2019 in [facility]  Alcohol Use Disorder Identification Test Final Score (AUDIT) 0      GAD-7    Flowsheet Row Office Visit from 03/22/2023 in [facility] Office Visit from 11/23/2022 in [facility] Office Visit from 08/24/2022 in [facility] Office Visit from 05/10/2022 in [facility] Office Visit from 04/14/2022 in [facility]  Total GAD-7 Score 0 0 0 1 3      PHQ2-9    Flowsheet Row Office Visit from 06/08/2023 in [facility] Office Visit from 03/22/2023 in [facility] Office Visit from 11/23/2022 in [facility] Office Visit from 08/24/2022 in [facility] Office Visit from 06/22/2022 in [facility]  PHQ-2 Total Score 0 0 0 0 0  PHQ-9 Total Score -- -- 0 0 0      Flowsheet Row Office Visit from 08/15/2023 in [facility] Office Visit from 03/22/2023 in [facility] Office Visit from 11/23/2022 in [facility]  C-SSRS RISK CATEGORY No Risk No Risk No Risk        Assessment and Plan:  GELSEY AMYX is a 73 year old African-American female with history of psychosis was evaluated in office today.  Patient is currently stable on the current medication regimen, discussed assessment and plan as noted below.  Other specified schizophrenia spectrum and other psychotic disorder-stable Currently denies any psychosis.  Managed on current dosage of risperidone . - Continue risperidone  0.5 mg at bedtime   Insomnia-stable Recent sleep issues due to sinus infection.  Has upcoming appointment with primary care provider for management of the same. - Continue trazodone  25-50 mg at bedtime as needed  Mild neurocognitive disorder-stable Currently denies any memory changes. - MMSE -03/22/2023-30 out of 30.  Reviewed and discussed labs-dated 06/08/2023 Prolactin: within normal limits (06/08/2023) TSH: within normal limits (06/08/2023) CBC with differential: within normal limits Liver function: lower end of normal Lipid panel-abnormal-patient to discuss with primary care provider. Hemoglobin A1c: 6.0   Collaboration of Care: Collaboration of Care: Primary Care Provider AEB patient encouraged to follow up with primary care provider.  Patient/Guardian was advised Release of Information must be obtained prior to any record release in order to collaborate their care with an outside provider. Patient/Guardian was advised if they have not already done so to contact the registration department to sign all necessary forms in order for us  to release information regarding their care.   Consent: Patient/Guardian gives verbal consent for treatment and assignment of benefits for services provided during this visit. Patient/Guardian expressed understanding and agreed to proceed.   Follow-up in  clinic in 4 months or sooner if needed.   This note was generated in part or whole with voice recognition software. Voice recognition is usually quite accurate but there are transcription errors that can and very often do occur. I apologize for any typographical errors that were not detected and corrected.    Catalea Labrecque, MD 08/16/2023, 9:44 AM

## 2023-08-15 NOTE — Assessment & Plan Note (Signed)
 Start flnoase 2 sprays per nostril daily.  Start nasal saline spray or irrigation 1-3 times daily.  If not improving complete a course of amoxicillin x 10 days.  Follow up if not improving as excepted.

## 2023-10-11 ENCOUNTER — Ambulatory Visit: Payer: Medicare Other | Admitting: Internal Medicine

## 2023-10-11 ENCOUNTER — Encounter: Payer: Self-pay | Admitting: Internal Medicine

## 2023-10-11 VITALS — BP 116/68 | HR 74 | Temp 97.6°F | Ht 62.75 in | Wt 203.2 lb

## 2023-10-11 DIAGNOSIS — J4520 Mild intermittent asthma, uncomplicated: Secondary | ICD-10-CM

## 2023-10-11 NOTE — Progress Notes (Unsigned)
 [facility] Pulmonary Medicine Consultation      Date: 10/11/2023,   MRN# [id number] Natalie [name] [birth year]     CHIEF COMPLAINT:   Follow-up assessment for asthma    HISTORY OF PRESENT ILLNESS     73 year old African-American female with a history of asthma presents today with shortness of breath and history of palpitations along with a diagnosis of obstructive sleep apnea  Assessment of asthma Uses albuterol infrequently I have advised that she use it prior to exercise and exertion No exacerbation at this time No evidence of heart failure at this time No evidence or signs of infection at this time No respiratory distress No fevers, chills, nausea, vomiting, diarrhea No evidence of lower extremity edema No evidence hemoptysis    Patient was diagnosed with obstructive sleep apnea in 2020 with AHI of 13 She underwent a CPAP titration study however did not follow-up with CPAP therapy due to cost  Uses Albuterol Inhaler as needed, very infrequent usage  Weighs 252 pounds at 5 feet 3 inches Highly recommend weight loss    PAST MEDICAL HISTORY   Past Medical History:  Diagnosis Date   Allergy    Arthritis    Asthma    Cataract    Dementia (HCC)    GERD (gastroesophageal reflux disease)    pt. denied   Heart murmur    was told years ago   History of shingles 2006   Hyperlipidemia      SURGICAL HISTORY   Past Surgical History:  Procedure Laterality Date   ABDOMINAL HYSTERECTOMY     COLONOSCOPY     POLYPECTOMY     VAGINAL DELIVERY     x2     FAMILY HISTORY   Family History  Problem Relation Age of Onset   Arthritis Mother    Diabetes Mother    Hypertension Mother    Liver cancer Mother    Colon cancer Father 14   Pancreatic cancer Brother    Colon polyps Brother    Breast cancer Daughter 25   Alcohol abuse Other    Heart disease Neg Hx    Esophageal cancer Neg Hx    Rectal cancer Neg Hx    Stomach cancer Neg Hx      SOCIAL  HISTORY   Social History   Tobacco Use   Smoking status: Never    Passive exposure: Past   Smokeless tobacco: Never  Vaping Use   Vaping status: Never Used  Substance Use Topics   Alcohol use: Not Currently   Drug use: No     MEDICATIONS    Home Medication:  Current Outpatient Rx   Order #: [id number] Class: Normal   Order #: [id number] Class: Historical Med   Order #: [id number] Class: Historical Med   Order #: [id number] Class: Historical Med   Order #: [id number] Class: Normal   Order #: [id number] Class: Normal    Current Medication:  Current Outpatient Medications:    albuterol (PROAIR HFA) 108 (90 Base) MCG/ACT inhaler, Inhale 2 puffs into the lungs every 6 (six) hours as needed for wheezing or shortness of breath., Disp: 18 g, Rfl: 1   cetirizine (ZYRTEC) 10 MG tablet, Take 10 mg by mouth daily., Disp: , Rfl:    Cholecalciferol (VITAMIN D3) 1000 units CAPS, Take 1 capsule by mouth daily., Disp: , Rfl:    ibuprofen (ADVIL) 200 MG tablet, Take 200 mg by mouth every 6 (six) hours as needed., Disp: , Rfl:    risperiDONE (RISPERDAL) 0.5 MG tablet, TAKE  1 TABLET BY MOUTH AT BEDTIME, Disp: 90 tablet, Rfl: 3   traZODone (DESYREL) 50 MG tablet, TAKE 1/2 TO 1 (ONE-HALF TO ONE) TABLET BY MOUTH AT BEDTIME AS NEEDED FOR SLEEP, Disp: 90 tablet, Rfl: 3    ALLERGIES   Patient has no known allergies.   BP 116/68 (BP Location: Left Arm, Patient Position: Sitting, Cuff Size: Normal)   Pulse 74   Temp 97.6 F (36.4 C) (Temporal)   Ht 5' 2.75" (1.594 m)   Wt 253 lb 3.2 oz (114.9 kg)   SpO2 96%   BMI 45.21 kg/m     Review of Systems: Gen:  Denies  fever, sweats, chills weight loss  HEENT: Denies blurred vision, double vision, ear pain, eye pain, hearing loss, nose bleeds, sore throat Cardiac:  No dizziness, chest pain or heaviness, chest tightness,edema, No JVD Resp:   No cough, -sputum production, -shortness of breath,-wheezing, -hemoptysis,  Other:  All other systems  negative   Physical Examination:   General Appearance: No distress  EYES PERRLA, EOM intact.   NECK Supple, No JVD Pulmonary: normal breath sounds, No wheezing.  CardiovascularNormal S1,S2.  No m/r/g.   Abdomen: Benign, Soft, non-tender. Neurology UE/LE 5/5 strength, no focal deficits Ext pulses intact, cap refill intact ALL OTHER ROS ARE NEGATIVE        ASSESSMENT/PLAN   73 year old African-American female morbidly obese with obstructive sleep apnea associated with asthma mild intermittent at this time with deconditioned state   Asthma mild intermittent use albuterol as needed Can use albuterol prior to exertion and activity Patient is well-controlled without any maintenance therapy at this time   I highly recommend weight loss Patient is not a candidate for oral device or inspire device due to cost   Palpitations Follow-up echocardiogram with cardiology  Obesity -recommend significant weight loss -recommend changing diet  Deconditioned state -Recommend increased daily activity and exercise  Avoid secondhand smoke Avoid SICK contacts Recommend  Masking  when appropriate Recommend Keep up-to-date with vaccinations   CURRENT MEDICATIONS REVIEWED AT LENGTH WITH PATIENT TODAY   Patient  satisfied with Plan of action and management. All questions answered  Follow up 6 months  Total Time Spent 41 mins   Lucie Leather, M.D.  [facility]  Medical Director [facility] Medical Director [facility] Cardio-Pulmonary Department

## 2023-10-11 NOTE — Patient Instructions (Signed)
 Recommend using albuterol 2 puffs prior to any type of exertional activity  Avoid Allergens and Irritants Avoid secondhand smoke Avoid SICK contacts Recommend  Masking  when appropriate Recommend Keep up-to-date with vaccinations

## 2023-10-18 ENCOUNTER — Telehealth: Payer: Self-pay | Admitting: Internal Medicine

## 2023-10-18 NOTE — Telephone Encounter (Signed)
 PT states her wt was put in wrong. Please correct it. Her wt is 203.2 (253 lbs was entered.)

## 2023-10-18 NOTE — Telephone Encounter (Signed)
Weight corrected.

## 2023-12-12 ENCOUNTER — Ambulatory Visit: Payer: Self-pay | Admitting: Psychiatry

## 2023-12-12 ENCOUNTER — Encounter: Payer: Self-pay | Admitting: Psychiatry

## 2023-12-12 VITALS — BP 128/86 | HR 82 | Temp 98.2°F | Ht 62.75 in | Wt 202.6 lb

## 2023-12-12 DIAGNOSIS — F28 Other psychotic disorder not due to a substance or known physiological condition: Secondary | ICD-10-CM

## 2023-12-12 DIAGNOSIS — G4701 Insomnia due to medical condition: Secondary | ICD-10-CM

## 2023-12-12 DIAGNOSIS — F0670 Mild neurocognitive disorder due to known physiological condition without behavioral disturbance: Secondary | ICD-10-CM | POA: Diagnosis not present

## 2023-12-12 NOTE — Progress Notes (Unsigned)
 [facility] MD OP Progress Note  12/12/2023 2:06 PM Natalie [name]  MRN:  [id number]  Chief Complaint:  Chief Complaint  Patient presents with   Follow-up   Anxiety   Depression   Medication Refill   Discussed the use of AI scribe software for clinical note transcription with the patient, who gave verbal consent to proceed.  History of Present Illness Natalie [name] is a 73 year old African-American female, lives in [place], has a history of psychosis who presents for a follow-up visit.  She is focused on weight management, currently weighing 202 pounds, with a goal to reduce her weight to 180-186 pounds. She has experienced slight weight loss since her last visit. Dietary changes include eating avocado toast and oatmeal, and reducing bacon and eggs, using water  instead of milk for oatmeal. Physical activity is limited, but she recently walked to a nearby stop sign and acknowledges the need to increase activity, especially with good weather.  She reports sleeping well and continues to take risperidone  0.5 mg without concerns.  No side effects reported at this time.  She appeared to be alert, oriented to person place time situation.  Denies any other concerns today.     Visit Diagnosis:    ICD-10-CM   1. 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 Mild neurocognitive disorder due to multiple etiologies  F06.70     3. Insomnia due to medical condition  G47.01    Obstructive sleep apnea with CPAP      Past Psychiatric History: I have reviewed past psychiatric history from progress note on 03/25/2019.  Past trials of medications like Invega Sustenna, risperidone .  Past Medical History:  Past Medical History:  Diagnosis Date   Allergy    Arthritis    Asthma    Cataract    Dementia (HCC)    GERD (gastroesophageal reflux disease)     pt. denied   Heart murmur    was told years ago   History of shingles 2006   Hyperlipidemia     Past Surgical History:  Procedure Laterality Date   ABDOMINAL HYSTERECTOMY     COLONOSCOPY     POLYPECTOMY     VAGINAL DELIVERY     x2    Family Psychiatric History: I have reviewed family psychiatric history from progress note on 03/25/2019.  Family History:  Family History  Problem Relation Age of Onset   Arthritis Mother    Diabetes Mother    Hypertension Mother    Liver cancer Mother    Colon cancer Father 72   Pancreatic cancer Brother    Colon polyps Brother    Breast cancer Daughter 80   Alcohol abuse Other    Heart disease Neg Hx    Esophageal cancer Neg Hx    Rectal cancer Neg Hx    Stomach cancer Neg Hx     Social History: I have reviewed social history from progress note on 03/25/2019. 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    Comment: retired  Tobacco Use   Smoking status: Never    Passive exposure:  Past   Smokeless tobacco: Never  Vaping Use   Vaping status: Never Used  Substance and Sexual Activity   Alcohol use: Not Currently   Drug use: No   Sexual activity: Not Currently  Other Topics Concern   Not on file  Social History Narrative   No living will   Requests daughter Natalie [name] as health care POA   Would accept resuscitation but no prolonged machines   No prolonged tube feeds--would leave to daughter      Right handed      Completed HS   Social Drivers of Health   Financial Resource Strain: Low Risk  (03/25/2019)   Overall Financial Resource Strain (CARDIA)    Difficulty of Paying Living Expenses: Not very hard  Food Insecurity: Food Insecurity Present (03/25/2019)   Hunger Vital Sign    Worried About  Running Out of Food in the Last Year: Sometimes true    Ran Out of Food in the Last Year: Sometimes true  Transportation Needs: Unmet Transportation Needs (03/25/2019)   PRAPARE - Transportation    Lack of Transportation (Medical): Yes    Lack of Transportation (Non-Medical): Yes  Physical Activity: Inactive (03/25/2019)   Exercise Vital Sign    Days of Exercise per Week: 0 days    Minutes of Exercise per Session: 0 min  Stress: Not on file  Social Connections: Unknown (12/21/2021)   [facility]   Social Network    Social Network: Not on file    Allergies: No Known Allergies  Metabolic Disorder Labs: Lab Results  Component Value Date   HGBA1C 6.0 06/08/2023   Lab Results  Component Value Date   PROLACTIN 10.5 06/08/2023   PROLACTIN 21.2 10/23/2020   Lab Results  Component Value Date   CHOL 245 (H) 06/08/2023   TRIG 195.0 (H) 06/08/2023   HDL 56.50 06/08/2023   CHOLHDL 4 06/08/2023   VLDL 39.0 06/08/2023   LDLCALC 150 (H) 06/08/2023   LDLCALC 149 (H) 05/28/2021   Lab Results  Component Value Date   TSH 1.74 06/08/2023   TSH 0.88 05/11/2022    Therapeutic Level Labs: No results found for: "LITHIUM" No results found for: "VALPROATE" No results found for: "CBMZ"  Current Medications: Current Outpatient Medications  Medication Sig Dispense Refill   albuterol  (PROAIR  HFA) 108 (90 Base) MCG/ACT inhaler Inhale 2 puffs into the lungs every 6 (six) hours as needed for wheezing or shortness of breath. 18 g 1   BOOSTRIX 5-2.5-18.5 LF-MCG/0.5 injection      cetirizine (ZYRTEC) 10 MG tablet Take 10 mg by mouth daily.     Cholecalciferol (VITAMIN D3) 1000 units CAPS Take 1 capsule by mouth daily.     ibuprofen (ADVIL) 200 MG tablet Take 200 mg by mouth every 6 (six) hours as needed.     risperiDONE  (RISPERDAL ) 0.5 MG tablet TAKE 1 TABLET BY MOUTH AT BEDTIME 90 tablet 3   traZODone  (DESYREL ) 50 MG tablet TAKE 1/2 TO 1 (ONE-HALF TO ONE) TABLET BY  MOUTH AT BEDTIME AS NEEDED FOR SLEEP 90 tablet 3   No current facility-administered medications for this visit.     Musculoskeletal: Strength & Muscle Tone: within normal limits Gait & Station: normal Patient leans: N/A  Psychiatric Specialty Exam: Review of Systems  Psychiatric/Behavioral: Negative.      Blood pressure 128/86, pulse 82, temperature 98.2 F (36.8 C), temperature source Temporal, height 5' 2.75" (1.594 m), weight 202 lb 9.6 oz (91.9 kg), SpO2 95%.Body  mass index is 36.18 kg/m.  General Appearance: Casual  Eye Contact:  Fair  Speech:  Clear and Coherent  Volume:  Normal  Mood:  Euthymic  Affect:  Congruent  Thought Process:  Goal Directed and Descriptions of Associations: Intact  Orientation:  Full (Time, Place, and Person)  Thought Content: Logical   Suicidal Thoughts:  No  Homicidal Thoughts:  No  Memory:  Immediate;   Fair Recent;   Fair Remote;   Fair  Judgement:  Fair  Insight:  Fair  Psychomotor Activity:  Normal  Concentration:  Concentration: Fair and Attention Span: Fair  Recall:  [employer] of Knowledge: Fair  Language: Fair  Akathisia:  No  Handed:  Right  AIMS (if indicated): done  Assets:  Desire for Improvement Housing Social Support Transportation  ADL's:  Intact  Cognition: WNL  Sleep:  Fair   Screenings: Midwife Visit from 12/12/2023 in [facility] Office Visit from 08/15/2023 in [facility] Office Visit from 03/22/2023 in [facility] Office Visit from 11/23/2022 in [facility] Office Visit from 08/24/2022 in [facility]  AIMS Total Score 0 0 0 0 0      AUDIT    Flowsheet Row Admission (Discharged) from 11/18/2019 in [facility]  Alcohol Use Disorder Identification Test Final Score  (AUDIT) 0      GAD-7    Flowsheet Row Office Visit from 03/22/2023 in [facility] Office Visit from 11/23/2022 in [facility] Office Visit from 08/24/2022 in [facility] Office Visit from 05/10/2022 in [facility] Office Visit from 04/14/2022 in [facility]  Total GAD-7 Score 0 0 0 1 3      PHQ2-9    Flowsheet Row Office Visit from 12/12/2023 in [facility] Office Visit from 06/08/2023 in [facility] Office Visit from 03[facility]22/2023 in [facility] Office Visit from 11/23/2022 in [facility] Office Visit from 08/24/2022 in [facility]  PHQ-2 Total Score 0 0 0 0 0  PHQ-9 Total Score -- -- -- 0 0      Flowsheet Row Office Visit from 12/12/2023 in [facility] Office Visit from 08/15/2023 in [facility] Office Visit from 03/22/2023 in [facility]  C-SSRS RISK CATEGORY No Risk No Risk No Risk        Assessment and Plan: ADOREE LUBER is a 73 year old African-American female with history of psychosis was evaluated in office today.  Patient is currently stable on the current medication regimen.  Plan as noted below.  Other specified schizophrenia spectrum and other psychotic disorder-stable Currently denies any psychosis.  Managed on current dosage of risperidone . - Continue Risperidone  0.5 mg at bedtime.  Insomnia-stable Patient reports sleep as overall good.  Continues to be managed with trazodone .  Also uses CPAP as needed. - Continue Trazodone  25-50 mg at bedtime as needed.  Mild  cognitive disorder-stable Currently denies any memory issues.  Will monitor closely.  Follow-up Follow-up in clinic in 5 months or sooner if needed.   Consent: Patient/Guardian gives verbal consent for treatment and assignment of  benefits for services provided during this visit. Patient/Guardian expressed understanding and agreed to proceed.  This note was generated in part or whole with voice recognition software. Voice recognition is usually quite accurate but there are transcription errors that can and very often do occur. I apologize for any typographical errors that were not detected and corrected.     Rondy Krupinski, MD 12/13/2023, 5:28 PM

## 2024-04-18 ENCOUNTER — Ambulatory Visit (INDEPENDENT_AMBULATORY_CARE_PROVIDER_SITE_OTHER): Admitting: Internal Medicine

## 2024-04-18 ENCOUNTER — Encounter: Payer: Self-pay | Admitting: Internal Medicine

## 2024-04-18 VITALS — BP 116/80 | HR 73 | Temp 98.3°F | Ht 63.0 in | Wt 205.8 lb

## 2024-04-18 DIAGNOSIS — J309 Allergic rhinitis, unspecified: Secondary | ICD-10-CM | POA: Diagnosis not present

## 2024-04-18 DIAGNOSIS — G4733 Obstructive sleep apnea (adult) (pediatric): Secondary | ICD-10-CM | POA: Diagnosis not present

## 2024-04-18 NOTE — Patient Instructions (Signed)
 You can go to the [facility] pharmacy and ask for Zyrtec 10 mg to take at night for your allergies, no prescription needed  Continue to use albuterol  as needed  Avoid Allergens and Irritants Avoid secondhand smoke Avoid SICK contacts Recommend  Masking  when appropriate Recommend Keep up-to-date with vaccinations

## 2024-04-18 NOTE — Progress Notes (Unsigned)
 [facility] Pulmonary Medicine Consultation      Date: 04/18/2024,   MRN# [id number] Natalie [name] [birth year]     CHIEF COMPLAINT:   Follow-up assessment for asthma Follow-up assessment for OSA   HISTORY OF PRESENT ILLNESS     73 year old African-American female with a history of asthma presents today with shortness of breath and history of palpitations along with a diagnosis of obstructive sleep apnea  Assessment of asthma Uses albuterol  infrequently I have advised that she use it prior to exercise and exertion No exacerbation at this time No evidence of heart failure at this time No evidence or signs of infection at this time No respiratory distress No fevers, chills, nausea, vomiting, diarrhea No evidence of lower extremity edema No evidence hemoptysis     Patient was diagnosed with obstructive sleep apnea in 2020 with AHI of 13 She underwent a CPAP titration study however did not follow-up with CPAP therapy due to cost  Uses Albuterol  Inhaler as needed, very infrequent usage  Weighs 252 pounds at 5 feet 3 inches Highly recommend weight loss  Patient with signs symptoms of allergic rhinitis Plan to start Zyrtec 10 mg nightly   PAST MEDICAL HISTORY   Past Medical History:  Diagnosis Date   Allergy    Arthritis    Asthma    Cataract    Dementia (HCC)    GERD (gastroesophageal reflux disease)    pt. denied   Heart murmur    was told years ago   History of shingles 2006   Hyperlipidemia      SURGICAL HISTORY   Past Surgical History:  Procedure Laterality Date   ABDOMINAL HYSTERECTOMY     COLONOSCOPY     POLYPECTOMY     VAGINAL DELIVERY     x2     FAMILY HISTORY   Family History  Problem Relation Age of Onset   Arthritis Mother    Diabetes Mother    Hypertension Mother    Liver cancer Mother    Colon cancer Father 53   Pancreatic cancer Brother    Colon polyps Brother    Breast cancer Daughter 67   Alcohol abuse Other     Heart disease Neg Hx    Esophageal cancer Neg Hx    Rectal cancer Neg Hx    Stomach cancer Neg Hx      SOCIAL HISTORY   Social History   Tobacco Use   Smoking status: Never    Passive exposure: Past   Smokeless tobacco: Never  Vaping Use   Vaping status: Never Used  Substance Use Topics   Alcohol use: Not Currently   Drug use: No     MEDICATIONS    Home Medication:  Current Outpatient Rx   Order #: [id number] Class: Normal   Order #: [id number] Class: Historical Med   Order #: [id number] Class: Historical Med   Order #: [id number] Class: Historical Med   Order #: [id number] Class: Historical Med   Order #: [id number] Class: Normal   Order #: [id number] Class: Normal    Current Medication:  Current Outpatient Medications:    albuterol  (PROAIR  HFA) 108 (90 Base) MCG/ACT inhaler, Inhale 2 puffs into the lungs every 6 (six) hours as needed for wheezing or shortness of breath., Disp: 18 g, Rfl: 1   BOOSTRIX 5-2.5-18.5 LF-MCG/0.5 injection, , Disp: , Rfl:    cetirizine (ZYRTEC) 10 MG tablet, Take 10 mg by mouth daily., Disp: , Rfl:    Cholecalciferol (VITAMIN D3) 1000 units CAPS, Take 1 capsule by  mouth daily., Disp: , Rfl:    ibuprofen (ADVIL) 200 MG tablet, Take 200 mg by mouth every 6 (six) hours as needed., Disp: , Rfl:    risperiDONE  (RISPERDAL ) 0.5 MG tablet, TAKE 1 TABLET BY MOUTH AT BEDTIME, Disp: 90 tablet, Rfl: 3   traZODone  (DESYREL ) 50 MG tablet, TAKE 1/2 TO 1 (ONE-HALF TO ONE) TABLET BY MOUTH AT BEDTIME AS NEEDED FOR SLEEP, Disp: 90 tablet, Rfl: 3    ALLERGIES   Patient has no known allergies.    BP 116/80   Pulse 73   Temp 98.3 F (36.8 C)   Ht 5' 3 (1.6 m)   Wt 205 lb 12.8 oz (93.4 kg)   SpO2 97%   BMI 36.46 kg/m   Physical Examination:   General Appearance: No distress  EYES PERRLA, EOM intact.   NECK Supple, No JVD Pulmonary: normal breath sounds, No wheezing.  CardiovascularNormal S1,S2.  No m/r/g.   Abdomen: Benign, Soft, non-tender. Neurology  UE/LE 5/5 strength, no focal deficits Ext pulses intact, cap refill intact ALL OTHER ROS ARE NEGATIVE          ASSESSMENT/PLAN   73 year old African-American female morbidly obese with obstructive sleep apnea associated with asthma mild intermittent at this time with deconditioned state   Asthma mild intermittent use albuterol  as needed Can use albuterol  prior to exertion and activity Patient is well-controlled without any maintenance therapy at this time No signs of exacerbation at this time No indication for prednisone  or antibiotics  Palpitations Follow-up echocardiogram with cardiology  Obesity -recommend significant weight loss -recommend changing diet  Deconditioned state -Recommend increased daily activity and exercise  Avoid Allergens and Irritants Avoid secondhand smoke Avoid SICK contacts Recommend  Masking  when appropriate Recommend Keep up-to-date with vaccinations   MEDICATION ADJUSTMENTS/LABS AND TESTS ORDERED: 10 mg Zyrtec at night for allergic rhinitis   CURRENT MEDICATIONS REVIEWED AT LENGTH WITH PATIENT TODAY   Patient  satisfied with Plan of action and management. All questions answered   Follow up 1 year   I spent a total of 21 minutes dedicated to the care of this patient on the date of this encounter to include pre-visit review of records, face-to-face time with the patient discussing conditions above, post visit ordering of testing, clinical documentation with the electronic health record, making appropriate referrals as documented, and communicating necessary information to the patient's healthcare team.    The Patient requires high complexity decision making for assessment and support, frequent evaluation and titration of therapies, application of advanced monitoring technologies and extensive interpretation of multiple databases.  Patient satisfied with Plan of action and management. All questions answered    Nickolas Alm Cellar,  M.D.  [facility]  Medical Director [facility] Medical Director [facility] Cardio-Pulmonary Department

## 2024-05-14 ENCOUNTER — Ambulatory Visit: Admitting: Psychiatry

## 2024-05-14 ENCOUNTER — Encounter: Payer: Self-pay | Admitting: Psychiatry

## 2024-05-14 ENCOUNTER — Other Ambulatory Visit: Payer: Self-pay

## 2024-05-14 VITALS — BP 123/76 | HR 94 | Temp 97.8°F | Ht 63.0 in | Wt 205.4 lb

## 2024-05-14 DIAGNOSIS — G4701 Insomnia due to medical condition: Secondary | ICD-10-CM

## 2024-05-14 DIAGNOSIS — F0670 Mild neurocognitive disorder due to known physiological condition without behavioral disturbance: Secondary | ICD-10-CM

## 2024-05-14 DIAGNOSIS — F28 Other psychotic disorder not due to a substance or known physiological condition: Secondary | ICD-10-CM

## 2024-05-14 MED ORDER — TRAZODONE HCL 50 MG PO TABS: 50.0000 mg | ORAL_TABLET | Freq: Every evening | ORAL | 3 refills | Status: AC | PRN

## 2024-05-14 MED ORDER — RISPERIDONE 0.25 MG PO TBDP: 0.2500 mg | ORAL_TABLET | Freq: Every day | ORAL | 1 refills | Status: AC

## 2024-05-14 NOTE — Progress Notes (Unsigned)
 BH MD OP Progress Note  05/14/2024 1:55 PM Natalie [name]  MRN:  [id number]  Chief Complaint:  Chief Complaint  Patient presents with   Follow-up   Medication Refill   Depression   Anxiety   Discussed the use of AI scribe software for clinical note transcription with the patient, who gave verbal consent to proceed.  History of Present Illness Natalie [name] is a 73 year old African-American female, lives in [place], has a history of psychosis was evaluated in office today for a follow-up appointment.  Since her last visit, she reports doing well and denies experiencing depression, anxiety, or psychotic symptoms. She denies hearing voices, seeing things, or experiencing any psychosis. She also denies any thoughts of self-harm or suicidal ideation.  Regarding sleep, she describes sleeping well and waking up feeling rested. She reports currently taking risperidone  and trazodone , and she also takes trazodone  50 mg at bedtime as needed for sleep.  She does have sleep apnea however has been noncompliant on CPAP.  She is unable to afford it.  She appeared to be alert, oriented to person place time situation.  Was able to answer all questions appropriately and did not seem to have any memory problems.  She receives social security income and lives in a senior living community.    Visit Diagnosis:    ICD-10-CM   1. 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 Mild neurocognitive disorder due to multiple etiologies  F06.70     3. Insomnia due to medical condition  G47.01 traZODone  (DESYREL ) 50 MG tablet   mood, OSA noncompliant on CPAP      Past Psychiatric History: I have reviewed past psychiatric history from progress note from 03/25/2019.  Past trials of medications like Invega Sustenna, risperidone .  Past  Medical History:  Past Medical History:  Diagnosis Date   Allergy    Arthritis    Asthma    Cataract    Dementia (HCC)    GERD (gastroesophageal reflux disease)    pt. denied   Heart murmur    was told years ago   History of shingles 2006   Hyperlipidemia     Past Surgical History:  Procedure Laterality Date   ABDOMINAL HYSTERECTOMY     COLONOSCOPY     POLYPECTOMY     VAGINAL DELIVERY     x2    Family Psychiatric History: I have reviewed family psychiatric history from progress note on 03/25/2019.  Family History:  Family History  Problem Relation Age of Onset   Arthritis Mother    Diabetes Mother    Hypertension Mother    Liver cancer Mother    Colon cancer Father 54   Pancreatic cancer Brother    Colon polyps Brother    Breast cancer Daughter 39   Alcohol abuse Other    Heart disease Neg Hx    Esophageal cancer Neg Hx    Rectal cancer Neg Hx    Stomach cancer Neg Hx     Social History: I have reviewed social history from progress note on 03/25/2019. 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    Comment: retired  Tobacco Use   Smoking status: Never    Passive exposure: Past   Smokeless tobacco: Never  Vaping Use   Vaping status: Never Used  Substance and Sexual Activity   Alcohol use: Not Currently   Drug use: No   Sexual activity: Not Currently  Other Topics Concern   Not on file  Social History Narrative   No living will   Requests daughter Joen as health care POA   Would accept resuscitation but no prolonged machines   No prolonged tube feeds--would leave to daughter      Right handed      Completed HS   Social Drivers of Health   Financial Resource Strain: Low Risk  (03/25/2019)   Overall Financial Resource  Strain (CARDIA)    Difficulty of Paying Living Expenses: Not very hard  Food Insecurity: Food Insecurity Present (03/25/2019)   Hunger Vital Sign    Worried About Running Out of Food in the Last Year: Sometimes true    Ran Out of Food in the Last Year: Sometimes true  Transportation Needs: Unmet Transportation Needs (03/25/2019)   PRAPARE - Transportation    Lack of Transportation (Medical): Yes    Lack of Transportation (Non-Medical): Yes  Physical Activity: Inactive (03/25/2019)   Exercise Vital Sign    Days of Exercise per Week: 0 days    Minutes of Exercise per Session: 0 min  Stress: Not on file  Social Connections: Unknown (12/21/2021)   [facility]   Social Network    Social Network: Not on file    Allergies: No Known Allergies  Metabolic Disorder Labs: Lab Results  Component Value Date   HGBA1C 6.0 06/08/2023   Lab Results  Component Value Date   PROLACTIN 10.5 06/08/2023   PROLACTIN 21.2 10/23/2020   Lab Results  Component Value Date   CHOL 245 (H) 06/08/2023   TRIG 195.0 (H) 06/08/2023   HDL 56.50 06/08/2023   CHOLHDL 4 06/08/2023   VLDL 39.0 06/08/2023   LDLCALC 150 (H) 06/08/2023   LDLCALC 149 (H) 05/28/2021   Lab Results  Component Value Date   TSH 1.74 06/08/2023   TSH 0.88 05/11/2022    Therapeutic Level Labs: No results found for: LITHIUM No results found for: VALPROATE No results found for: CBMZ  Current Medications: Current Outpatient Medications  Medication Sig Dispense Refill   Risperidone  0.25 MG TBDP Take 1 tablet (0.25 mg total) by mouth at bedtime. 90 tablet 1   albuterol  (PROAIR  HFA) 108 (90 Base) MCG/ACT inhaler Inhale 2 puffs into the lungs every 6 (six) hours as needed for wheezing or shortness of breath. 18 g 1   BOOSTRIX 5-2.5-18.5 LF-MCG/0.5 injection      cetirizine (ZYRTEC) 10 MG tablet Take 10 mg by mouth daily.     Cholecalciferol (VITAMIN D3) 1000 units CAPS Take 1 capsule by mouth daily.     ibuprofen  (ADVIL) 200 MG tablet Take 200 mg by mouth every 6 (six) hours as needed.     traZODone  (DESYREL ) 50 MG tablet Take 1 tablet (50 mg total) by mouth at bedtime as needed for sleep. 90 tablet 3   No current facility-administered medications for this visit.     Musculoskeletal: Strength & Muscle Tone: UTA Gait & Station: normal Patient leans: N/A  Psychiatric Specialty Exam: Review of Systems  Psychiatric/Behavioral: Negative.      Blood pressure 123/76, pulse 94, temperature 97.8 F (36.6  C), temperature source Temporal, height 5' 3 (1.6 m), weight 205 lb 6.4 oz (93.2 kg).Body mass index is 36.38 kg/m.  General Appearance: Fairly Groomed  Eye Contact:  Fair  Speech:  Clear and Coherent  Volume:  Normal  Mood:  Euthymic  Affect:  Congruent  Thought Process:  Goal Directed and Descriptions of Associations: Intact  Orientation:  Full (Time, Place, and Person)  Thought Content: Logical   Suicidal Thoughts:  No  Homicidal Thoughts:  No  Memory:  Immediate;   Fair Recent;   Fair Remote;   Fair  Judgement:  Fair  Insight:  Fair  Psychomotor Activity:  Normal  Concentration:  Concentration: Fair and Attention Span: Fair  Recall:  [employer] of Knowledge: Fair  Language: Fair  Akathisia:  No  Handed:  Right  AIMS (if indicated): done  Assets:  Communication Skills Desire for Improvement Housing Social Support  ADL's:  Intact  Cognition: WNL  Sleep:  Fair   Screenings: Midwife Visit from 12/12/2023 in [facility] Office Visit from 08/15/2023 in [facility] Office Visit from 03/22/2023 in [facility] Office Visit from 11/23/2022 in [facility] Office Visit from 08/24/2022 in [facility]  AIMS Total Score 0 0 0 0 0   AUDIT    Flowsheet Row Admission  (Discharged) from 11/18/2019 in [facility]  Alcohol Use Disorder Identification Test Final Score (AUDIT) 0   GAD-7    Flowsheet Row Office Visit from 03/22/2023 in [facility] Office Visit from 11/23/2022 in [facility] Office Visit from 08/24/2022 in [facility] Office Visit from 05/10/2022 in [facility] Office Visit from 04/14/2022 in [facility]  Total GAD-7 Score 0 0 0 1 3   PHQ2-9    Flowsheet Row Office Visit from 12/12/2023 in [facility] Office Visit from 06/08/2023 in [facility] Office Visit from 03/22/2023 in [facility] Office Visit from 11/23/2022 in [facility] Office Visit from 08/24/2022 in [facility]  PHQ-2 Total Score 0 0 0 0 0  PHQ-9 Total Score -- -- -- 0 0   Flowsheet Row Office Visit from 12/12/2023 in [facility] Office Visit from 08/15/2023 in [facility] Office Visit from 03/22/2023 in [facility]  C-SSRS RISK CATEGORY No Risk No Risk No Risk     Assessment and Plan: Natalie [name] is a 73 year old African-American female with history of psychosis was evaluated in office today.  Discussed assessment and plan as noted below.  1. 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 Will reduce Risperidone  to 0.25 mg at bedtime  2. Mild neurocognitive disorder due to multiple etiologies-stable Currently denies any memory problems.  Will monitor  closely.  3. Insomnia due to medical condition-improving Have obstructive sleep apnea noncompliant on CPAP.  Reports unable to afford it due to financial problems.  Uses trazodone  which does help with sleep. Encouraged use of CPAP. Continue Trazodone  25-50 mg at bedtime as needed  Patient will benefit from labs including lipid panel,  TSH, hemoglobin A1c, prolactin, sodium, platelet count.  Patient has upcoming annual physical exam with primary care provider and agrees to get this done.  Moreover she is unable to complete will consider repeating these when she comes back in for her next visit.  Follow-up Follow-up in clinic in 5 months or sooner if needed.      Consent: Patient/Guardian gives verbal consent for treatment and assignment of benefits for services provided during this visit. Patient/Guardian expressed understanding and agreed to proceed.   This note was generated in part or whole with voice recognition software. Voice recognition is usually quite accurate but there are transcription errors that can and very often do occur. I apologize for any typographical errors that were not detected and corrected.    Cristin Szatkowski, MD 05/14/2024, 1:55 PM

## 2024-05-17 ENCOUNTER — Ambulatory Visit

## 2024-05-23 ENCOUNTER — Ambulatory Visit (INDEPENDENT_AMBULATORY_CARE_PROVIDER_SITE_OTHER): Admitting: Family Medicine

## 2024-05-23 DIAGNOSIS — Z0000 Encounter for general adult medical examination without abnormal findings: Secondary | ICD-10-CM

## 2024-05-23 NOTE — Patient Instructions (Signed)
 I really enjoyed getting to talk with you today! I am available on Tuesdays and Thursdays for virtual visits if you have any questions or concerns, or if I can be of any further assistance.   CHECKLIST FROM ANNUAL WELLNESS VISIT:  -Follow up (please call to schedule if not scheduled after visit):   -yearly for annual wellness visit with primary care office  Here is a list of your preventive care/health maintenance measures and the plan for each if any are due:  PLAN For any measures below that may be due:    1. Please call the Gastroenterology office about your colonoscopy: [phone]   2. Please bring us  a copy of the vaccines you received at [facility] with the dates, names of the vaccines, etc., so that we can update your medical record accurately.   3. Let us  know if you decide to do a bone density test.   4. Get you flu shot at the office as planned.   Health Maintenance  Topic Date Due   Zoster Vaccines- Shingrix (1 of 2) 01/07/1970   DTaP/Tdap/Td (2 - Tdap) 02/12/2019   Colonoscopy  08/25/2022   Influenza Vaccine  03/08/2024   Medicare Annual Wellness (AWV)  06/07/2024   COVID-19 Vaccine (4 - 2025-26 season) 06/28/2024   DEXA SCAN  Completed   Hepatitis C Screening  Completed   Meningococcal B Vaccine  Aged Out   Pneumococcal Vaccine: 50+ Years  Discontinued   Mammogram  Discontinued    -See a dentist at least yearly  -Get your eyes checked and then per your eye specialist's recommendations  -Other issues addressed today:   -I have included below further information regarding a healthy whole foods based diet, physical activity guidelines for adults, stress management and opportunities for social connections. I hope you find this information useful.    -----------------------------------------------------------------------------------------------------------------------------------------------------------------------------------------------------------------------------------------------------------    NUTRITION: -eat real food: lots of colorful vegetables (half the plate) and fruits -5-7 servings of vegetables and fruits per day (fresh or steamed is best), exp. 2 servings of vegetables with lunch and dinner and 2 servings of fruit per day. Berries and greens such as kale and collards are great choices.  -consume on a regular basis:  fresh fruits, fresh veggies, fish, nuts, seeds, healthy oils (such as olive oil, avocado oil), whole grains (make sure for bread/pasta/crackers/etc., that the first ingredient on label contains the word whole), legumes. -can eat small amounts of dairy and lean meat (no larger than the palm of your hand), but avoid processed meats such as ham, bacon, lunch meat, etc. -drink water  -try to avoid fast food and pre-packaged foods, processed meat, ultra processed foods/beverages (donuts, candy, etc.) -most experts advise limiting sodium to < 2300mg  per day, should limit further is any chronic conditions such as high blood pressure, heart disease, diabetes, etc. The American Heart Association advised that < 1500mg  is is ideal -try to avoid foods/beverages that contain any ingredients with names you do not recognize  -try to avoid foods/beverages  with added sugar or sweeteners/sweets  -try to avoid sweet drinks (including diet drinks): soda, juice, Gatorade, sweet tea, power drinks, diet drinks -try to avoid white rice, white bread, pasta (unless whole grain)  EXERCISE GUIDELINES FOR ADULTS: -if you wish to increase your physical activity, do so gradually and with the approval of your doctor -STOP and seek medical care immediately if you have any chest pain, chest discomfort or trouble breathing when starting or  increasing exercise  -move and stretch your  body, legs, feet and arms when sitting for long periods -Physical activity guidelines for optimal health in adults: -get at least 150 minutes per week of moderate exercise (can talk, but not sing); this is about 20-30 minutes of sustained activity 5-7 days per week or two 10-15 minute episodes of sustained activity 5-7 days per week -do some muscle building/resistance training/strength training at least 2 days per week  -balance exercises 3+ days per week:   Stand somewhere where you have something sturdy to hold onto if you lose balance    1) lift up on toes, then back down, start with 5x per day and work up to 20x   2) stand and lift one leg straight out to the side so that foot is a few inches of the floor, start with 5x each side and work up to 20x each side   3) stand on one foot, start with 5 seconds each side and work up to 20 seconds on each side  If you need ideas or help with getting more active:  -Silver sneakers https://tools.silversneakers.com  -Walk with a Doc: http://www.duncan-williams.com/  -try to include resistance (weight lifting/strength building) and balance exercises twice per week: or the following link for ideas: http://castillo-powell.com/  BuyDucts.dk  STRESS MANAGEMENT: -can try meditating, or just sitting quietly with deep breathing while intentionally relaxing all parts of your body for 5 minutes daily -if you need further help with stress, anxiety or depression please follow up with your primary doctor or contact the wonderful folks at [facility]: [phone]  SOCIAL CONNECTIONS: -options in [place] if you wish to engage in more social and exercise related activities:  -Silver sneakers https://tools.silversneakers.com  -Walk with a Doc: http://www.duncan-williams.com/  -Check out the [facility] Active Adults 50+  section on the [place] of [employer] (hiking clubs, book clubs, cards and games, chess, exercise classes, aquatic classes and much more) - see the website for details: https://www.[place].gov/departments/parks-recreation/active-adults50  -YouTube has lots of exercise videos for different ages and abilities as well  -[facility] (a variety of indoor and outdoor inperson activities for adults). ([phone]. [address].  -Virtual Online Classes (a variety of topics): see seniorplanet.org or call [phone]  -consider volunteering at a school, hospice center, church, senior center or elsewhere    ADVANCED HEALTHCARE DIRECTIVES:  [facility] [place] Advanced Directives assistance:   ExpressWeek.com.cy  Everyone should have advanced health care directives in place. This is so that you get the care you want, should you ever be in a situation where you are unable to make your own medical decisions.   From the [place] Advanced Directive Website: Advance Health Care Directives are legal documents in which you give written instructions about your health care if, in the future, you cannot speak for yourself.   A health care power of attorney allows you to name a person you trust to make your health care decisions if you cannot make them yourself. A declaration of a desire for a natural death (or living will) is document, which states that you desire not to have your life prolonged by extraordinary measures if you have a terminal or incurable illness or if you are in a vegetative state. An advance instruction for mental health treatment makes a declaration of instructions, information and preferences regarding your mental health treatment. It also states that you are aware that the advance instruction authorizes a mental health treatment provider to act according to your wishes. It may also outline your consent or refusal of  mental health treatment. A declaration of an anatomical gift allows anyone over the age of 28 to make a gift by will, organ donor card or other document.   Please see the following website or an elder law attorney for forms, FAQs and for completion of advanced directives: [email or web address]  Print production planner Health Care Directives Advance Health Care Directives (http://guzman.com/)  Or copy and paste the following to your web browser: PoshChat.fi

## 2024-05-23 NOTE — Progress Notes (Signed)
 PATIENT CHECK-IN and HEALTH RISK ASSESSMENT QUESTIONNAIRE:  -completed by phone/video for upcoming Medicare Preventive Visit   Pre-Visit Check-in: 1)Vitals (height, wt, BP, etc) - record in vitals section for visit on day of visit Request home vitals (wt, BP, etc.) and enter into vitals, THEN update Vital Signs SmartPhrase below at the top of the HPI. See below.  2)Review and Update Medications, Allergies PMH, Surgeries, Social history in Epic 3)Hospitalizations in the last year with date/reason? n  4)Review and Update Care Team (patient's specialists) in Epic 5) Complete PHQ9 in Epic  6) Complete Fall Screening in Epic 7)Review all Health Maintenance Due and order if not done.  Medicare Wellness Patient Questionnaire:  Answer theses question about your habits: How often do you have a drink containing alcohol?n How many drinks containing alcohol do you have on a typical day when you are drinking?na How often do you have six or more drinks on one occasion?na Have you ever smoked?n Quit date if applicable? na  How many packs a day do/did you smoke? na Do you use smokeless tobacco?n Do you use an illicit drugs?n On average, how many days per week do you engage in moderate to strenuous exercise (like a brisk walk)?n On average, how many minutes do you engage in exercise at this level?na Typical breakfast: avocado toast, oatmeal Typical dinner:   Typical snacks:fruit, chips, fritos, popcorn  Beverages: diet sodas  Answer theses question about your everyday activities: Can you perform most household chores?y Are you deaf or have significant trouble hearing?n Do you feel that you have a problem with memory?n Do you feel safe at home? yes Last dentist visit?goes yearly 8. Do you have any difficulty performing your everyday activities?n Are you having any difficulty walking, taking medications on your own, and or difficulty managing daily home needs?n Do you have difficulty walking or  climbing stairs?n Do you have difficulty dressing or bathing?n Do you have difficulty doing errands alone such as visiting a doctor's office or shopping?n Do you currently have any difficulty preparing food and eating?n Do you currently have any difficulty using the toilet?n Do you have any difficulty managing your finances?n Do you have any difficulties with housekeeping of managing your housekeeping?n   Do you have Advanced Directives in place (Living Will, Healthcare Power or Attorney)? n   Last eye Exam and location? [facility], reports she goes annually   Do you currently use prescribed or non-prescribed narcotic or opioid pain medications?n  Do you have a history or close family history of breast, ovarian, tubal or peritoneal cancer or a family member with BRCA (breast cancer susceptibility 1 and 2) gene mutations? See PMH/FH     ----------------------------------------------------------------------------------------------------------------------------------------------------------------------------------------------------------------------  Because this visit was a virtual/telehealth visit, some criteria may be missing or patient reported. Any vitals not documented were not able to be obtained and vitals that have been documented are patient reported.    MEDICARE ANNUAL PREVENTIVE VISIT WITH PROVIDER: (Welcome to Medicare, initial annual wellness or annual wellness exam)  Virtual Visit via Phone Note  I connected with Natalie [name] on 05/23/24 by phone and verified that I am speaking with the correct person using two identifiers. She prefers to talk by phone.   Location patient: home Location provider:work or home offic: [place], [place] Persons participating in the virtual visit: patient, provider  Concerns and/or follow up today:no concerns, stable   See HM section in Epic for other details of completed HM.    ROS: negative for report  of fevers, unintentional weight  loss, vision changes, vision loss, hearing loss or change, chest pain, sob, hemoptysis, melena, hematochezia, hematuria, falls, bleeding or bruising, thoughts of suicide or self harm, memory loss  Patient-completed extensive health risk assessment - reviewed and discussed with the patient: See Health Risk Assessment completed with patient prior to the visit either above or in recent phone note. This was reviewed in detailed with the patient today and appropriate recommendations, orders and referrals were placed as needed per Summary below and patient instructions.   Review of Medical History: -PMH, PSH, Family History and current specialty and care providers reviewed and updated and listed below   Patient Care Team: Jimmy Charlie FERNS, MD as PCP - General Darliss Rogue, MD as PCP - Cardiology (Cardiology) Georjean Darice HERO, MD as Consulting Physician (Neurology) Coby Height, MD as Consulting Physician (Psychiatry)   Past Medical History:  Diagnosis Date   Allergy    Arthritis    Asthma    Cataract    Dementia (HCC)    GERD (gastroesophageal reflux disease)    pt. 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    Comment: retired  Tobacco Use   Smoking status: Never    Passive exposure: Past   Smokeless tobacco: Never  Vaping Use   Vaping status: Never Used  Substance and Sexual Activity   Alcohol use: Not Currently   Drug use: No   Sexual activity: Not Currently  Other Topics Concern   Not on file   Social History Narrative   No living will   Requests daughter Natalie [name] as health care POA   Would accept resuscitation but no prolonged machines   No prolonged tube feeds--would leave to daughter      Right handed      Completed HS   Social Drivers of Health   Financial Resource Strain: Low Risk  (03/25/2019)   Overall Financial Resource Strain (CARDIA)    Difficulty of Paying Living Expenses: Not very hard  Food Insecurity: Food Insecurity Present (03/25/2019)   Hunger Vital Sign    Worried About Running Out of Food in the Last Year: Sometimes true    Ran Out of Food in the Last Year: Sometimes true  Transportation Needs: Unmet Transportation Needs (03/25/2019)   PRAPARE - Transportation    Lack of Transportation (Medical): Yes    Lack of Transportation (Non-Medical): Yes  Physical Activity: Inactive (03/25/2019)   Exercise Vital Sign    Days of Exercise per Week: 0 days    Minutes of Exercise per Session: 0 min  Stress: Not on file  Social Connections: Unknown (12/21/2021)   Received from [facility]   Social Network    Social Network: Not on file  Intimate Partner Violence: Unknown (11/11/2021)   Received from [facility]   HITS    Physically Hurt: Not on file    Insult  or Talk Down To: Not on file    Threaten Physical Harm: Not on file    Scream or Curse: Not on file    Family History  Problem Relation Age of Onset   Arthritis Mother    Diabetes Mother    Hypertension Mother    Liver cancer Mother    Colon cancer Father 21   Pancreatic cancer Brother    Colon polyps Brother    Breast cancer Daughter 53   Alcohol abuse Other    Heart disease Neg Hx    Esophageal cancer Neg Hx    Rectal cancer Neg Hx    Stomach cancer Neg Hx     Current Outpatient Medications on File Prior to Visit  Medication Sig Dispense Refill   albuterol  (PROAIR  HFA) 108 (90 Base) MCG/ACT inhaler Inhale 2 puffs into the lungs every 6 (six) hours as needed for wheezing or shortness of  breath. 18 g 1   BOOSTRIX 5-2.5-18.5 LF-MCG/0.5 injection      cetirizine (ZYRTEC) 10 MG tablet Take 10 mg by mouth daily.     Cholecalciferol (VITAMIN D3) 1000 units CAPS Take 1 capsule by mouth daily.     ibuprofen (ADVIL) 200 MG tablet Take 200 mg by mouth every 6 (six) hours as needed.     Risperidone  0.25 MG TBDP Take 1 tablet (0.25 mg total) by mouth at bedtime. 90 tablet 1   traZODone  (DESYREL ) 50 MG tablet Take 1 tablet (50 mg total) by mouth at bedtime as needed for sleep. 90 tablet 3   No current facility-administered medications on file prior to visit.    No Known Allergies     Physical Exam Vitals requested from patient and listed below if patient had equipment and was able to obtain at home for this virtual visit: There were no vitals filed for this visit. Estimated body mass index is 36.38 kg/m as calculated from the following:   Height as of 05/14/24: 5' 3 (1.6 m).   Weight as of 05/14/24: 205 lb 6.4 oz (93.2 kg).  EKG (optional): deferred due to virtual visit  GENERAL: alert, oriented, no acute distress detected, full vision exam deferred due to pandemic and/or virtual encounter  PSYCH/NEURO: pleasant and cooperative, no obvious depression or anxiety, speech and thought processing grossly intact, Cognitive function grossly intact        05/23/2024   10:05 AM 12/12/2023    2:04 PM 06/08/2023    8:52 AM 06/08/2023    8:27 AM 03/22/2023    1:52 PM  Depression screen PHQ 2/9  Decreased Interest 0  0 0   Down, Depressed, Hopeless 0  0 0   PHQ - 2 Score 0  0 0      Information is confidential and restricted. Go to Review Flowsheets to unlock data.       04/14/2022   11:28 AM 06/06/2022    9:01 AM 06/08/2023    8:27 AM 06/08/2023    8:52 AM 05/23/2024   10:05 AM  Fall Risk  Falls in the past year?  0 0 0 1  Was there an injury with Fall?   0  0  Fall Risk Category Calculator   0  1  (RETIRED) Patient Fall Risk Level Low fall risk       Patient at Risk for  Falls Due to   No Fall Risks    Fall risk Follow up   Falls evaluation completed  Education provided;Falls evaluation completed  Data saved with a previous flowsheet row definition     SUMMARY AND PLAN:  Medicare annual wellness visit, subsequent    Discussed applicable health maintenance/preventive health measures and advised and referred or ordered per patient preferences: -reports she had her shingles and Tdap vaccines at [facility] last year - asked her to bring record so that we can update chart, she agrees to do so -has her record for the covid vaccine, updated -she says she will call GI to schedule colonoscopy, number provided -reports she will get her flu shot at her upcoming physical -she declines the bone density test  Health Maintenance  Topic Date Due   Zoster Vaccines- Shingrix (1 of 2) 01[birth year]07/1968   DTaP/Tdap/Td (2 - Tdap) 02/12/2019   Colonoscopy  08/25/2022   Influenza Vaccine  03/08/2024   COVID-19 Vaccine (4 - 2025-26 season) 06/28/2024   Medicare Annual Wellness (AWV)  05/23/2025   DEXA SCAN  Completed   Hepatitis C Screening  Completed   Meningococcal B Vaccine  Aged Out   Pneumococcal Vaccine: 50+ Years  Discontinued   Mammogram  Discontinued      Education and counseling on the following was provided based on the above review of health and a plan/checklist for the patient, along with additional information discussed, was provided for the patient in the patient instructions :  -Advised on importance of completing advanced directives, discussed options for completing and provided information in patient instructions as well -Advised and counseled on a healthy lifestyle  -Reviewed patient's current diet. Advised and counseled on a whole foods based healthy diet. A summary of a healthy diet was provided in the Patient Instructions.  -reviewed patient's current physical activity level and discussed exercise guidelines for adults. Discussed community resources  and ideas for safe exercise at home to assist in meeting exercise guideline recommendations in a safe and healthy way.  -Advise yearly dental visits at minimum and regular eye exams   Follow up: see patient instructions     Patient Instructions  I really enjoyed getting to talk with you today! I am available on Tuesdays and Thursdays for virtual visits if you have any questions or concerns, or if I can be of any further assistance.   CHECKLIST FROM ANNUAL WELLNESS VISIT:  -Follow up (please call to schedule if not scheduled after visit):   -yearly for annual wellness visit with primary care office  Here is a list of your preventive care/health maintenance measures and the plan for each if any are due:  PLAN For any measures below that may be due:    1. Please call the Gastroenterology office about your colonoscopy: [phone]   2. Please bring us  a copy of the vaccines you received at [facility] with the dates, names of the vaccines, etc., so that we can update your medical record accurately.   3. Let us  know if you decide to do a bone density test.   4. Get you flu shot at the office as planned.   Health Maintenance  Topic Date Due   Zoster Vaccines- Shingrix (1 of 2) 01/07/1968   DTaP/Tdap/Td (2 - Tdap) 02/12/2019   Colonoscopy  08/25/2022   Influenza Vaccine  03/08/2024   Medicare Annual Wellness (AWV)  06/07/2024   COVID-19 Vaccine (4 - 2025-26 season) 06/28/2024   DEXA SCAN  Completed   Hepatitis C Screening  Completed   Meningococcal B Vaccine  Aged Out   Pneumococcal Vaccine: 50+ Years  Discontinued   Mammogram  Discontinued    -  See a dentist at least yearly  -Get your eyes checked and then per your eye specialist's recommendations  -Other issues addressed today:   -I have included below further information regarding a healthy whole foods based diet, physical activity guidelines for adults, stress management and opportunities for social connections. I hope you  find this information useful.   -----------------------------------------------------------------------------------------------------------------------------------------------------------------------------------------------------------------------------------------------------------    NUTRITION: -eat real food: lots of colorful vegetables (half the plate) and fruits -5-7 servings of vegetables and fruits per day (fresh or steamed is best), exp. 2 servings of vegetables with lunch and dinner and 2 servings of fruit per day. Berries and greens such as kale and collards are great choices.  -consume on a regular basis:  fresh fruits, fresh veggies, fish, nuts, seeds, healthy oils (such as olive oil, avocado oil), whole grains (make sure for bread/pasta/crackers/etc., that the first ingredient on label contains the word whole), legumes. -can eat small amounts of dairy and lean meat (no larger than the palm of your hand), but avoid processed meats such as ham, bacon, lunch meat, etc. -drink water  -try to avoid fast food and pre-packaged foods, processed meat, ultra processed foods/beverages (donuts, candy, etc.) -most experts advise limiting sodium to < 2300mg  per day, should limit further is any chronic conditions such as high blood pressure, heart disease, diabetes, etc. The American Heart Association advised that < 1500mg  is is ideal -try to avoid foods/beverages that contain any ingredients with names you do not recognize  -try to avoid foods/beverages  with added sugar or sweeteners/sweets  -try to avoid sweet drinks (including diet drinks): soda, juice, Gatorade, sweet tea, power drinks, diet drinks -try to avoid white rice, white bread, pasta (unless whole grain)  EXERCISE GUIDELINES FOR ADULTS: -if you wish to increase your physical activity, do so gradually and with the approval of your doctor -STOP and seek medical care immediately if you have any chest pain, chest discomfort or trouble  breathing when starting or increasing exercise  -move and stretch your body, legs, feet and arms when sitting for long periods -Physical activity guidelines for optimal health in adults: -get at least 150 minutes per week of moderate exercise (can talk, but not sing); this is about 20-30 minutes of sustained activity 5-7 days per week or two 10-15 minute episodes of sustained activity 5-7 days per week -do some muscle building/resistance training/strength training at least 2 days per week  -balance exercises 3+ days per week:   Stand somewhere where you have something sturdy to hold onto if you lose balance    1) lift up on toes, then back down, start with 5x per day and work up to 20x   2) stand and lift one leg straight out to the side so that foot is a few inches of the floor, start with 5x each side and work up to 20x each side   3) stand on one foot, start with 5 seconds each side and work up to 20 seconds on each side  If you need ideas or help with getting more active:  -Silver sneakers https://tools.silversneakers.com  -Walk with a Doc: http://www.duncan-williams.com/  -try to include resistance (weight lifting/strength building) and balance exercises twice per week: or the following link for ideas: http://castillo-powell.com/  BuyDucts.dk  STRESS MANAGEMENT: -can try meditating, or just sitting quietly with deep breathing while intentionally relaxing all parts of your body for 5 minutes daily -if you need further help with stress, anxiety or depression please follow up with your primary doctor  or contact the wonderful folks at [facility]: [phone]  SOCIAL CONNECTIONS: -options in [place] if you wish to engage in more social and exercise related activities:  -Silver sneakers https://tools.silversneakers.com  -Walk with a Doc: http://www.duncan-williams.com/  -Check out the  [facility] Active Adults 50+ section on the [place] of [employer] (hiking clubs, book clubs, cards and games, chess, exercise classes, aquatic classes and much more) - see the website for details: https://www.[place].gov/departments/parks-recreation/active-adults50  -YouTube has lots of exercise videos for different ages and abilities as well  -[facility] (a variety of indoor and outdoor inperson activities for adults). [phone]. [address]  -Virtual Online Classes (a variety of topics): see seniorplanet.org or call [phone]  -consider volunteering at a school, hospice center, church, senior center or elsewhere    ADVANCED HEALTHCARE DIRECTIVES:  [place] Advanced Directives assistance:   ExpressWeek.com.cy  Everyone should have advanced health care directives in place. This is so that you get the care you want, should you ever be in a situation where you are unable to make your own medical decisions.   From the [place] Advanced Directive Website: Advance Health Care Directives are legal documents in which you give written instructions about your health care if, in the future, you cannot speak for yourself.   A health care power of attorney allows you to name a person you trust to make your health care decisions if you cannot make them yourself. A declaration of a desire for a natural death (or living will) is document, which states that you desire not to have your life prolonged by extraordinary measures if you have a terminal or incurable illness or if you are in a vegetative state. An advance instruction for mental health treatment makes a declaration of instructions, information and preferences regarding your mental health treatment. It also states that you are aware that the advance instruction authorizes a mental health treatment provider to act according to your wishes. It may also outline  your consent or refusal of mental health treatment. A declaration of an anatomical gift allows anyone over the age of 1 to make a gift by will, organ donor card or other document.   Please see the following website or an elder law attorney for forms, FAQs and for completion of advanced directives: [place]  Print production planner Health Care Directives Advance Health Care Directives (http://guzman.[email or web address]/)  Or copy and paste the following to your web browser: PoshChat.fi          Chiquita JONELLE Cramp, DO

## 2024-06-11 ENCOUNTER — Ambulatory Visit (INDEPENDENT_AMBULATORY_CARE_PROVIDER_SITE_OTHER): Payer: Medicare Other | Admitting: Nurse Practitioner

## 2024-06-11 ENCOUNTER — Encounter: Payer: Self-pay | Admitting: Nurse Practitioner

## 2024-06-11 VITALS — BP 122/78 | HR 79 | Temp 98.1°F | Ht 63.0 in | Wt 205.2 lb

## 2024-06-11 DIAGNOSIS — Z23 Encounter for immunization: Secondary | ICD-10-CM | POA: Diagnosis not present

## 2024-06-11 DIAGNOSIS — J4520 Mild intermittent asthma, uncomplicated: Secondary | ICD-10-CM

## 2024-06-11 DIAGNOSIS — G4739 Other sleep apnea: Secondary | ICD-10-CM | POA: Diagnosis not present

## 2024-06-11 DIAGNOSIS — Z7189 Other specified counseling: Secondary | ICD-10-CM

## 2024-06-11 DIAGNOSIS — Z1211 Encounter for screening for malignant neoplasm of colon: Secondary | ICD-10-CM

## 2024-06-11 DIAGNOSIS — Z1231 Encounter for screening mammogram for malignant neoplasm of breast: Secondary | ICD-10-CM

## 2024-06-11 DIAGNOSIS — Z0000 Encounter for general adult medical examination without abnormal findings: Secondary | ICD-10-CM

## 2024-06-11 DIAGNOSIS — J301 Allergic rhinitis due to pollen: Secondary | ICD-10-CM

## 2024-06-11 DIAGNOSIS — F29 Unspecified psychosis not due to a substance or known physiological condition: Secondary | ICD-10-CM

## 2024-06-11 DIAGNOSIS — E7800 Pure hypercholesterolemia, unspecified: Secondary | ICD-10-CM

## 2024-06-11 DIAGNOSIS — Z13820 Encounter for screening for osteoporosis: Secondary | ICD-10-CM

## 2024-06-11 LAB — COMPREHENSIVE METABOLIC PANEL WITH GFR
ALT: 10 U/L (ref 0–35)
AST: 18 U/L (ref 0–37)
Albumin: 4.2 g/dL (ref 3.5–5.2)
Alkaline Phosphatase: 61 U/L (ref 39–117)
BUN: 11 mg/dL (ref 6–23)
CO2: 30 meq/L (ref 19–32)
Calcium: 9.4 mg/dL (ref 8.4–10.5)
Chloride: 102 meq/L (ref 96–112)
Creatinine, Ser: 0.99 mg/dL (ref 0.40–1.20)
GFR: 55.81 mL/min — ABNORMAL LOW (ref >60.00)
Glucose, Bld: 91 mg/dL (ref 70–99)
Potassium: 3.9 meq/L (ref 3.5–5.1)
Sodium: 139 meq/L (ref 135–145)
Total Bilirubin: 0.7 mg/dL (ref 0.2–1.2)
Total Protein: 7.6 g/dL (ref 6.0–8.3)

## 2024-06-11 LAB — CBC WITH DIFFERENTIAL/PLATELET
Basophils Absolute: 0 K/uL (ref 0.0–0.1)
Basophils Relative: 0.8 % (ref 0.0–3.0)
Eosinophils Absolute: 0.1 K/uL (ref 0.0–0.7)
Eosinophils Relative: 3.1 % (ref 0.0–5.0)
HCT: 43.9 % (ref 36.0–46.0)
Hemoglobin: 14.6 g/dL (ref 12.0–15.0)
Lymphocytes Relative: 57.7 % — ABNORMAL HIGH (ref 12.0–46.0)
Lymphs Abs: 2.2 K/uL (ref 0.7–4.0)
MCHC: 33.3 g/dL (ref 30.0–36.0)
MCV: 91.2 fl (ref 78.0–100.0)
Monocytes Absolute: 0.4 K/uL (ref 0.1–1.0)
Monocytes Relative: 9.5 % (ref 3.0–12.0)
Neutro Abs: 1.1 K/uL — ABNORMAL LOW (ref 1.4–7.7)
Neutrophils Relative %: 28.9 % — ABNORMAL LOW (ref 43.0–77.0)
Platelets: 249 K/uL (ref 150.0–400.0)
RBC: 4.81 Mil/uL (ref 3.87–5.11)
RDW: 13.7 % (ref 11.5–15.5)
WBC: 3.9 K/uL — ABNORMAL LOW (ref 4.0–10.5)

## 2024-06-11 LAB — LIPID PANEL
Cholesterol: 249 mg/dL — ABNORMAL HIGH (ref 0–200)
HDL: 56.3 mg/dL (ref >39.00)
LDL Cholesterol: 159 mg/dL — ABNORMAL HIGH (ref 0–99)
NonHDL: 192.91
Total CHOL/HDL Ratio: 4
Triglycerides: 172 mg/dL — ABNORMAL HIGH (ref 0.0–149.0)
VLDL: 34.4 mg/dL (ref 0.0–40.0)

## 2024-06-11 MED ORDER — FLUTICASONE PROPIONATE 50 MCG/ACT NA SUSP: 2.0000 | Freq: Every day | NASAL | 0 refills | Status: AC

## 2024-06-11 NOTE — Assessment & Plan Note (Signed)
 Discussed age-appropriate immunizations and screening exams.  Did review patient's personal, surgical, social, family histories.  Patient is up-to-date with all age-appropriate vaccinations she would like.  Update flu vaccine today.  Patient is overdue for CRC screening.  Order placed today.  Patient is aged out of CRC screening and status post hysterectomy.  Orders placed for mammogram and bone density scan.  Patient given information to call and set up at her convenience.  Patient was given information at discharge about preventative healthcare maintenance with anticipatory guidance.

## 2024-06-11 NOTE — Assessment & Plan Note (Signed)
 History of same.  Patient uses albuterol  inhaler as needed.  Stable at this juncture patient is also followed by pulmonology.  Continue

## 2024-06-11 NOTE — Assessment & Plan Note (Signed)
 History of the same pending lipid panel today

## 2024-06-11 NOTE — Assessment & Plan Note (Signed)
 History of same.  Patient is followed by pulmonology.  Patient states that the CPAP was not affordable continue follow-up pulmonology as recommended

## 2024-06-11 NOTE — Assessment & Plan Note (Signed)
 Currently followed by psychiatry maintained on risperidone  and trazodone .  Patient denies HI/SI/AVH.  Continue medication as prescribed follow-up specialist as recommended.

## 2024-06-11 NOTE — Progress Notes (Signed)
 Established Patient Office Visit  Subjective   Patient ID: Natalie [name], female    DOB: September 17, 1950  Age: 73 y.o. MRN: [id number]  Chief Complaint  Patient presents with   Transitions Of Care    HPI  OSA: Patient currently followed by Dr. Isaiah cannot afford the CPAP   Mood disorder: Patient currently maintained on risperidone  0.25 mg nightly along with trazodone  50 mg nightly as needed.  Followed by psychiatry Dr. Eappen  Asthma:  Albuterol  inhaler she does not use it once a month. She is followed by pulmonology  for complete physical and follow up of chronic conditions.  Immunizations: -Tetanus: Completed in 2025 -Influenza: update today  -Shingles: Completed shingrix -Pneumonia: unsure?  Diet: Fair diet. She is eating twice a day. She will do breakfast and dinner. She will snack . She will drink coffee, she will have a zero soda and water   Exercise: No regular exercise.  Eye exam: Completes annually. Glasses. Needs updating   Dental exam: Completes annually    Colonoscopy: Completed in 08/25/2017, repeat 5 years.  Patient is overdue. Order placed today  Lung Cancer Screening: NA  Pap smear: Status post hysterectomy.  Patient is aged out  Mammogram: 07/17/2023, repeat 1 year last performed at [facility]  DEXA: Overdue?. Order placed   Sleep: going to bed around 10-11 and get up around 9-10. Feels rested  Advance directive: Does not have one      Review of Systems  Constitutional:  Negative for chills and fever.  Respiratory:  Negative for shortness of breath.   Cardiovascular:  Negative for chest pain and leg swelling.  Gastrointestinal:  Negative for abdominal pain, blood in stool, constipation, diarrhea, nausea and vomiting.       BM every other day   Genitourinary:  Negative for dysuria and hematuria.  Neurological:  Negative for dizziness, tingling and headaches.  Psychiatric/Behavioral:  Negative for hallucinations and suicidal ideas.        Objective:     BP 122/78   Pulse 79   Temp 98.1 F (36.7 C) (Oral)   Ht 5' 3 (1.6 m)   Wt 205 lb 3.2 oz (93.1 kg)   SpO2 94%   BMI 36.35 kg/m  BP Readings from Last 3 Encounters:  06/11/24 122/78  04/18/24 116/80  10/11/23 116/68   Wt Readings from Last 3 Encounters:  06/11/24 205 lb 3.2 oz (93.1 kg)  04/18/24 205 lb 12.8 oz (93.4 kg)  10/11/23 203 lb 3.2 oz (92.2 kg)   SpO2 Readings from Last 3 Encounters:  06/11/24 94%  04/18/24 97%  10/11/23 96%      Physical Exam Vitals and nursing note reviewed.  Constitutional:      Appearance: Normal appearance.  HENT:     Right Ear: Tympanic membrane, ear canal and external ear normal.     Left Ear: Tympanic membrane, ear canal and external ear normal.     Mouth/Throat:     Mouth: Mucous membranes are moist.     Pharynx: Oropharynx is clear.  Eyes:     Extraocular Movements: Extraocular movements intact.     Pupils: Pupils are equal, round, and reactive to light.  Cardiovascular:     Rate and Rhythm: Normal rate and regular rhythm.     Pulses: Normal pulses.     Heart sounds: Normal heart sounds.  Pulmonary:     Effort: Pulmonary effort is normal.     Breath sounds: Normal breath sounds.  Abdominal:  General: Bowel sounds are normal. There is no distension.     Palpations: There is no mass.     Tenderness: There is no abdominal tenderness.     Hernia: No hernia is present.  Musculoskeletal:     Right lower leg: No edema.     Left lower leg: No edema.  Lymphadenopathy:     Cervical: No cervical adenopathy.  Skin:    General: Skin is warm.  Neurological:     General: No focal deficit present.     Mental Status: She is alert.     Deep Tendon Reflexes:     Reflex Scores:      Bicep reflexes are 2+ on the right side and 2+ on the left side.      Patellar reflexes are 2+ on the right side and 2+ on the left side.    Comments: Bilateral upper and lower extremity strength 5/5  Psychiatric:        Mood  and Affect: Mood normal.        Behavior: Behavior normal.        Thought Content: Thought content normal.        Judgment: Judgment normal.      No results found for any visits on 06/11/24.    The 10-year ASCVD risk score (Arnett DK, et al., 2019) is: 18.7%    Assessment & Plan:   Problem List Items Addressed This Visit       Respiratory   Allergic rhinitis due to pollen   Will trial fluticasone  nasal spray.  Epistaxis precautions discussed      Relevant Medications   fluticasone  (FLONASE ) 50 MCG/ACT nasal spray   Mild intermittent asthma   History of same.  Patient uses albuterol  inhaler as needed.  Stable at this juncture patient is also followed by pulmonology.  Continue      Other sleep apnea   History of same.  Patient is followed by pulmonology.  Patient states that the CPAP was not affordable continue follow-up pulmonology as recommended        Other   Routine general medical examination at a health care facility - Primary   Discussed age-appropriate immunizations and screening exams.  Did review patient's personal, surgical, social, family histories.  Patient is up-to-date with all age-appropriate vaccinations she would like.  Update flu vaccine today.  Patient is overdue for CRC screening.  Order placed today.  Patient is aged out of CRC screening and status post hysterectomy.  Orders placed for mammogram and bone density scan.  Patient given information to call and set up at her convenience.  Patient was given information at discharge about preventative healthcare maintenance with anticipatory guidance.      Relevant Orders   CBC with Differential/Platelet   Comprehensive metabolic panel with GFR   TSH   Advanced directives, counseling/discussion   Does not have a healthcare power of attorney or living will.  Discussed in office today      [facility])   Currently followed by psychiatry maintained on risperidone  and trazodone .  Patient denies HI/SI/AVH.   Continue medication as prescribed follow-up specialist as recommended.      Elevated LDL cholesterol level   History of the same pending lipid panel today      Relevant Orders   Lipid panel   Other Visit Diagnoses       Screening for colon cancer         Screening mammogram for breast cancer  Relevant Orders   MM 3D SCREENING MAMMOGRAM BILATERAL BREAST     Screening for osteoporosis       Relevant Orders   DG Bone Density     Need for influenza vaccination       Relevant Orders   Flu vaccine HIGH DOSE PF(Fluzone Trivalent) (Completed)       Return in about 1 year (around 06/11/2025) for CPE and Labs.    Adina Crandall, NP

## 2024-06-11 NOTE — Assessment & Plan Note (Signed)
 Will trial fluticasone  nasal spray.  Epistaxis precautions discussed

## 2024-06-11 NOTE — Assessment & Plan Note (Signed)
 Does not have a healthcare power of attorney or living will.  Discussed in office today

## 2024-06-11 NOTE — Patient Instructions (Signed)
Nice to see you today I will be in touch with the labs once I have reviewed them  Follow up with me in 1 year, sooner if you need me We did update your flu vaccine today

## 2024-06-12 LAB — TSH: TSH: 1.69 u[IU]/mL (ref 0.35–5.50)

## 2024-06-14 ENCOUNTER — Ambulatory Visit: Payer: Self-pay | Admitting: Nurse Practitioner

## 2024-07-17 ENCOUNTER — Ambulatory Visit
Admission: RE | Admit: 2024-07-17 | Discharge: 2024-07-17 | Disposition: A | Discharge status: Home / Self Care | Source: Ambulatory Visit | Attending: Nurse Practitioner | Admitting: Nurse Practitioner

## 2024-07-17 DIAGNOSIS — Z1231 Encounter for screening mammogram for malignant neoplasm of breast: Secondary | ICD-10-CM

## 2024-07-17 DIAGNOSIS — M8589 Other specified disorders of bone density and structure, multiple sites: Secondary | ICD-10-CM | POA: Insufficient documentation

## 2024-07-17 DIAGNOSIS — Z13820 Encounter for screening for osteoporosis: Secondary | ICD-10-CM | POA: Insufficient documentation

## 2024-08-21 ENCOUNTER — Ambulatory Visit: Admitting: Nurse Practitioner

## 2024-08-21 VITALS — BP 118/82 | HR 80 | Temp 98.3°F | Ht 63.0 in | Wt 202.4 lb

## 2024-08-21 DIAGNOSIS — T148XXA Other injury of unspecified body region, initial encounter: Secondary | ICD-10-CM | POA: Diagnosis not present

## 2024-08-21 DIAGNOSIS — R0789 Other chest pain: Secondary | ICD-10-CM | POA: Diagnosis not present

## 2024-08-21 NOTE — Progress Notes (Signed)
 "  Established Patient Office Visit  Subjective   Patient ID: Natalie [name], female    DOB: Nov 23, 1950  Age: 74 y.o. MRN: [id number]  Chief Complaint  Patient presents with   Breast Pain    Pt complains of pain above right breast. Pt is unsure if she bruised the area moving christmas boxes. Pt felt pain last Thursday. Hurts when moving right arm.     HPI   With a history OSA, asthma, GERD, OA, elevated LDL  Discussed the use of AI scribe software for clinical note transcription with the patient, who gave verbal consent to proceed.  History of Present Illness Natalie [name] is a 74 year old female who presents with right-sided chest pain after lifting boxes.  She experiences pain in the upper right chest area, which began a couple of weeks ago after lifting boxes in her storage room. The pain is triggered by arm movements, such as when backing up, but does not occur when she is sitting still.  No numbness, tingling, or weakness in the arm. There is no history of neck injury. No chest pain, shortness of breath, nausea, vomiting, fever, chills, or cough.  She has tried taking Advil once but is unsure of its effectiveness. Her current medications include trazodone , risperidone , Flonase , Zyrtec, and albuterol  as needed, along with calcium and vitamin D  supplements. She has no known drug allergies.     Review of Systems  Constitutional:  Negative for chills and fever.  Respiratory:  Negative for cough and shortness of breath.   Cardiovascular:  Positive for chest pain.  Neurological:  Negative for headaches.      Objective:     BP 118/82   Pulse 80   Temp 98.3 F (36.8 C) (Oral)   Ht 5' 3 (1.6 m)   Wt 202 lb 6.4 oz (91.8 kg)   SpO2 96%   BMI 35.85 kg/m    Physical Exam Vitals and nursing note reviewed.  Constitutional:      Appearance: Normal appearance.  Cardiovascular:     Rate and Rhythm: Normal rate and regular rhythm.     Pulses:          Radial  pulses are 2+ on the right side and 2+ on the left side.     Heart sounds: Normal heart sounds.  Pulmonary:     Effort: Pulmonary effort is normal.     Breath sounds: Normal breath sounds.  Chest:     Chest wall: No tenderness.  Musculoskeletal:       Arms:  Neurological:     Mental Status: She is alert.     Deep Tendon Reflexes:     Reflex Scores:      Bicep reflexes are 2+ on the right side and 2+ on the left side.    Comments: Bilateral upper extremity strength 5/5      No results found for any visits on 08/21/24.    The 10-year ASCVD risk score (Arnett DK, et al., 2019) is: 18%    Assessment & Plan:   Problem List Items Addressed This Visit   None Visit Diagnoses       Chest wall pain    -  Primary     Muscle strain         Assessment and Plan Assessment & Plan Chest wall muscle strain Intermittent right-sided chest pain likely due to muscle strain from lifting. Pain reproducible with movement, indicating musculoskeletal origin. Cardiac or pulmonary causes less  likely. - Advised acetaminophen  or ibuprofen for pain as needed. - Recommended avoiding overreaching or overstretching. - Suggested ice or heat therapy for muscle discomfort. - Consider muscle relaxers if no improvement, with caution for side effects.   Return if symptoms worsen or fail to improve.    Adina Crandall, NP  "

## 2024-08-21 NOTE — Patient Instructions (Signed)
 Nice to see you today You have strained a chest muscle Rest it best you can  You can use tyelnol as needed Also can use heat/ice. If you do not start to improve we can consider a muscle relaxer Follow up or call if you do not start to improve

## 2024-10-08 ENCOUNTER — Ambulatory Visit: Admitting: Psychiatry

## 2025-06-12 ENCOUNTER — Encounter: Admitting: Nurse Practitioner
